# Patient Record
Sex: Male | Born: 1955 | Race: White | Hispanic: No | Marital: Married | Smoking: Never smoker
Health system: Southern US, Community
[De-identification: ages and names within clinical notes are randomized; demographics above are authoritative.]

## PROBLEM LIST (undated history)

## (undated) DIAGNOSIS — Z9889 Other specified postprocedural states: Secondary | ICD-10-CM

## (undated) DIAGNOSIS — F32A Depression, unspecified: Secondary | ICD-10-CM

## (undated) DIAGNOSIS — E7800 Pure hypercholesterolemia, unspecified: Secondary | ICD-10-CM

## (undated) DIAGNOSIS — T7840XA Allergy, unspecified, initial encounter: Secondary | ICD-10-CM

## (undated) DIAGNOSIS — M549 Dorsalgia, unspecified: Secondary | ICD-10-CM

## (undated) DIAGNOSIS — Z87442 Personal history of urinary calculi: Secondary | ICD-10-CM

## (undated) DIAGNOSIS — F329 Major depressive disorder, single episode, unspecified: Secondary | ICD-10-CM

## (undated) DIAGNOSIS — R112 Nausea with vomiting, unspecified: Secondary | ICD-10-CM

## (undated) DIAGNOSIS — Z8739 Personal history of other diseases of the musculoskeletal system and connective tissue: Secondary | ICD-10-CM

## (undated) DIAGNOSIS — E119 Type 2 diabetes mellitus without complications: Secondary | ICD-10-CM

## (undated) DIAGNOSIS — G8929 Other chronic pain: Secondary | ICD-10-CM

## (undated) DIAGNOSIS — I10 Essential (primary) hypertension: Secondary | ICD-10-CM

## (undated) DIAGNOSIS — M1990 Unspecified osteoarthritis, unspecified site: Secondary | ICD-10-CM

## (undated) HISTORY — DX: Allergy, unspecified, initial encounter: T78.40XA

## (undated) HISTORY — PX: JOINT REPLACEMENT: SHX530

## (undated) HISTORY — PX: CARPAL TUNNEL RELEASE: SHX101

## (undated) HISTORY — PX: SPINE SURGERY: SHX786

## (undated) HISTORY — PX: KNEE SURGERY: SHX244

## (undated) HISTORY — PX: OTHER SURGICAL HISTORY: SHX169

---

## 1999-08-30 ENCOUNTER — Encounter: Payer: Self-pay | Admitting: Neurosurgery

## 1999-08-30 ENCOUNTER — Encounter: Admission: RE | Admit: 1999-08-30 | Discharge: 1999-08-30 | Payer: Self-pay | Admitting: Neurosurgery

## 1999-09-11 ENCOUNTER — Encounter: Payer: Self-pay | Admitting: Neurosurgery

## 1999-09-13 ENCOUNTER — Encounter: Payer: Self-pay | Admitting: Neurosurgery

## 1999-09-13 ENCOUNTER — Ambulatory Visit (HOSPITAL_COMMUNITY): Admission: RE | Admit: 1999-09-13 | Discharge: 1999-09-14 | Payer: Self-pay | Admitting: Neurosurgery

## 2000-09-23 ENCOUNTER — Emergency Department (HOSPITAL_COMMUNITY): Admission: EM | Admit: 2000-09-23 | Discharge: 2000-09-23 | Payer: Self-pay | Admitting: Emergency Medicine

## 2001-06-30 ENCOUNTER — Encounter: Payer: Self-pay | Admitting: Emergency Medicine

## 2001-06-30 ENCOUNTER — Emergency Department (HOSPITAL_COMMUNITY): Admission: EM | Admit: 2001-06-30 | Discharge: 2001-06-30 | Payer: Self-pay | Admitting: Emergency Medicine

## 2002-02-19 ENCOUNTER — Ambulatory Visit (HOSPITAL_COMMUNITY): Admission: RE | Admit: 2002-02-19 | Discharge: 2002-02-19 | Payer: Self-pay | Admitting: Pulmonary Disease

## 2005-08-22 ENCOUNTER — Ambulatory Visit: Admission: RE | Admit: 2005-08-22 | Discharge: 2005-08-22 | Payer: Self-pay | Admitting: Neurosurgery

## 2005-08-22 ENCOUNTER — Ambulatory Visit: Payer: Self-pay | Admitting: Cardiology

## 2005-08-22 ENCOUNTER — Encounter: Payer: Self-pay | Admitting: Cardiology

## 2005-08-23 ENCOUNTER — Ambulatory Visit: Payer: Self-pay

## 2005-09-05 ENCOUNTER — Inpatient Hospital Stay (HOSPITAL_COMMUNITY): Admission: RE | Admit: 2005-09-05 | Discharge: 2005-09-09 | Payer: Self-pay | Admitting: Neurosurgery

## 2010-03-07 ENCOUNTER — Ambulatory Visit: Payer: Self-pay | Admitting: Internal Medicine

## 2010-03-07 ENCOUNTER — Ambulatory Visit (HOSPITAL_COMMUNITY)
Admission: RE | Admit: 2010-03-07 | Discharge: 2010-03-07 | Payer: Self-pay | Source: Home / Self Care | Attending: Internal Medicine | Admitting: Internal Medicine

## 2010-07-27 NOTE — Discharge Summary (Signed)
NAME:  Ethan Oliver, Ethan Oliver NO.:  [id number]   MEDICAL RECORD NO.:  [id number]          PATIENT TYPE:  INP   LOCATION:  3007                         FACILITY:  [facility]   PHYSICIAN:  Danae Orleans. Venetia Maxon, M.D.  DATE OF BIRTH:  [birth year]   DATE OF ADMISSION:  09/05/2005  DATE OF DISCHARGE:  09/09/2005                                 DISCHARGE SUMMARY   REASON FOR ADMISSION:  Lumbar scoliosis, lumbar spondylosis,  spondylolisthesis, degenerative disk disease, herniated disk, and lumbar  radiculopathy, L3-4, L4-5, and L5-S1 levels.   FINAL DIAGNOSES:  Lumbar scoliosis, lumbar spondylosis, spondylolisthesis,  degenerative disk disease, herniated disk, and lumbar radiculopathy, L3-4,  L4-5, and L5-S1 levels.   ADDITIONAL DIAGNOSES:  1.  Hypertension.  2.  Obesity.   HISTORY OF ILLNESS AND HOSPITAL COURSE:  Ethan Oliver is a 55 year old man  with significant lumbar spondylosis, spondylolisthesis and scoliosis, and  severe lumbar radiculopathy.  He was admitted on the same-day-as-procedure  basis on September 05, 2005, and underwent laminectomy and decompression with  interbody fusion and pedicle screw fixation, L3-S1 levels.  Postoperatively,  he was doing well.  Had some incisional pain, but had full strength in both  lower extremities.  He was gradually mobilized starting on September 06, 2005,  with physical therapy.  Home health services were arranged, including a  hospital bed, a rolling walker, and home physical therapy.  He was gradually  mobilized, taking oral pain medication, and doing well as of September 08, 2005,  and as of September 09, 2005, was doing well.  His dressing was removed.  He was  discharged home in stable and satisfactory condition having tolerated his  operation and hospitalization well, with instructions to follow up in my  office in 1 month and prescription of Vicodin 10/500 mg one every 4 hours as  needed for pain, dispense #100, and Valium 5 mg 1 p.o. q.6 h. as needed  for  muscular spasm, dispense #50.      Danae Orleans. Venetia Maxon, M.D.  Electronically Signed     JDS/MEDQ  D:  09/09/2005  T:  09/09/2005  Job:  [id number]

## 2010-07-27 NOTE — Op Note (Signed)
NAME:  Ethan Oliver, Ethan Oliver NO.:  [id number]   MEDICAL RECORD NO.:  [id number]          PATIENT TYPE:  INP   LOCATION:  3007                         FACILITY:  [facility]   PHYSICIAN:  Danae Orleans. Venetia Maxon, M.D.  DATE OF BIRTH:  1955-07-26   DATE OF PROCEDURE:  09/05/2005  DATE OF DISCHARGE:                                 OPERATIVE REPORT   PREOPERATIVE DIAGNOSIS:  Lumbar scoliosis, lumbar spondylosis, and  spondylolisthesis with degenerative disk disease and radiculopathy, L3-4, L4-  5, L5-S1 levels.   POSTOPERATIVE DIAGNOSIS:  Lumbar scoliosis, lumbar spondylosis, and  spondylolisthesis with degenerative disk disease and radiculopathy, L3-4, L4-  5, L5-S1 levels.   PROCEDURES:  1.  Redo laminectomy, L5-S1 bilaterally.  2.  Lumbar laminectomy of L3-4 and L4-5 levels with posterior lumbar      interbody fusion.  3.  Posterior lumbar interbody fusion with 8 mm peak interbody cages and      Osteocel at L3-4 and 10 mm interbody cages and Osteocel at L4-5, with      pedicle screw fixation, L3-S1 bilaterally with posterolateral      arthrodesis using morcellized bone autograft end-osseous.   SURGEON:  Danae Orleans. Venetia Maxon, M.D.   ASSISTANT:  Clydene Fake, M.D.   ANESTHESIA:  General endotracheal anesthesia.   ESTIMATED BLOOD LOSS:  900 cc with 500 cc of CellSaver blood returned to the  patient.   COMPLICATIONS:  None.   DISPOSITION:  To recovery.   INDICATIONS:  Ethan Oliver is a 55 year old man who has previously undergone  L5-S1 microdiskectomy on the left, who has developed severe low back and  bilateral lower extremity pain, right greater than left, with significant  spondylosis, spondylolisthesis, and scoliosis, L3-4 and L4-5 levels, in  particular, with marked degenerative disk disease and significant  biforaminal stenosis.  The most affected levels were L3-4, L4-5, and L5-S1  levels.  It was elected to take Mr. Jou to surgery for decompression and  fusion at the  affected levels.   PROCEDURE:  Mr. Gatling is brought to the operating room.  Following  satisfactory and uncomplicated induction of general endotracheal anesthesia,  placement of intravenous lines, and Foley catheter, the patient was placed  in the prone position on the operating room table.  His low back was then  prepped and draped in the usual sterile fashion.  A plain incision was  infiltrated with 0.25% Marcaine and 0.5% lidocaine with 200,000 epinephrine.  The incision was made in the midline and carried through adipose tissue to  the lumbodorsal fascia and was incised bilaterally.  Subperiosteal  dissection was performed, exposing the L3-5 and sacral transverse processes  and sacral ala.  Subsequently, intraoperative x-ray confirmed correct  orientation of the L3 with marker probes to the L3, L4, L5 transverse  processes and the sacral ala.  Subsequently, a total laminectomy of L3, L4,  and L5 was performed.  The redo laminectomy was performed on the left of L5-  S1.  The thecal sac was decompressed, and spondylotic, degenerative material  was removed, resulting in significant decompression of the thecal  sac and  all affected nerve roots.  There was a large spondylytic spur with  retrolisthesis at the L3-4 level with significant right foraminal  compression of the L3 and L4 nerve roots.  There were calcified disk  herniations at L3-4 and L4-5 levels, and a central calcified disk herniation  at L5-S1.  Under loupe magnification, diskectomy was performed at L3-4  bilaterally and L4-5 bilaterally.  After a radical removal of disk material  and use of trial sizers, peak interbody cages were placed at 8 mm at L3-4  and bilaterally into L4-5 10 mm cages.  These were packed with Osteocel, and  the cages were inserted in the interspace and countersunk appropriately.  Additional Osteocel was placed at each of these levels and tamped into  position.  Initially, the plan was to place a TLIF  cage at the L5-S1 level,  but this is highly degenerated, and it was not possible to enter the disk  space.  This was attempted on the right side, but because of significant  collapse of the interspace, it was felt the nerve roots should be  decompressed, but the fusion cage would not be placed.  Subsequently, sacral  alae and L3, L4, L5 transverse processes and lateral facet joint plexuses  were decorticated bilaterally.  Subsequently, pedicle screw fixation was  placed 6.5 x 45 mm sacral screws bilaterally and then 6.5 x 50 mm screws at  L4-5 bilaterally and 6.5 x 55 mm screws at L3 bilaterally.  All screws had  excellent purchase.  Their positioning was confirmed on AP and lateral  fluoroscopy.  Subsequently, a 90 mm rod was placed on the right and a 100 mm  rod was placed on the left, and these were locked down in situ with the  locking caps.  Prior to doing so, the remaining morcellized bone autograft,  which was ground up in the disposable bone mill and mixed with Osteocel and  also the bone dust which was aspirated in a Lukens trap at the time of bone  removal with the high speed drill, these were all mixed together and then  placed in the posterolateral region, L3-S1 levels.  Subsequently, prior to  placing the bone graft and the rods, the wound was irrigated with bacitracin  and saline.  Soft tissues were inspected and found to be in good repair.  The self-retaining retractor was then removed.  The lumbodorsal fascia was  closed with 1-0 Vicryl sutures.  The subcutaneous tissues were  reapproximated with 2-0 Vicryl interrupted, inverted sutures, and the skin  edges were reapproximated with inverted 0 Vicryl subcuticular stitch.  The  wound was dressed with Benzoin and Steri-Strips, Telfa gauze and tape.  Patient was extubated in the operating room, taken to the recovery room in  stable, satisfactory condition, having tolerated the operation well.  Counts were correct at the end of  the case.      Danae Orleans. Venetia Maxon, M.D.  Electronically Signed     JDS/MEDQ  D:  09/05/2005  T:  09/05/2005  Job:  [id number]

## 2010-07-27 NOTE — Op Note (Signed)
[facility]  Patient:    Ethan Oliver, Ethan Oliver                        MRN: [id number] Proc. Date: 09/13/99 Adm. Date:  [id number] Disc. Date: [id number] Attending:  Josie Saunders                           Operative Report  PREOPERATIVE DIAGNOSIS:  Herniated lumbar disk L5-S1 left with left S1 radiculopathy, degenerative disk disease and spondylosis.  POSTOPERATIVE DIAGNOSIS:  Herniated lumbar disk L5-S1 left with left S1 radiculopathy, degenerative disk disease and spondylosis.  PROCEDURE:  Left hemisemilaminectomy L5-S1 with microdiskectomy and microdissection.  SURGEON:  Danae Orleans. Venetia Maxon, M.D.  ASSISTANT:  Cristi Loron, M.D.  ANESTHESIA:  General endotracheal anesthesia.  ESTIMATED BLOOD LOSS:  Less than 50 cc.  COMPLICATIONS:  None.  DISPOSITION:  Recovery.  INDICATIONS:  Ethan Oliver is a 55 year old man with left S1 radiculopathy and herniated disk at L2-3, L3-4, L4-5 and L5-S1 levels.  He has a central to left-sided disk herniation noted at the L5-S1 level, which appears to be compression the S1 nerve root and has S1 radiculopathy on examination.  It was elected to take him to surgery for decompression.  PROCEDURE:  Ethan Oliver was brought to the operating room.  Following the satisfactory and noncomplicated induction of general endotracheal anesthesia and placement of intravenous lines, he was placed in the prone position on the operating table.  Low back was shaved, prepped and draped in the usual sterile fashion.  Area of planned incision was infiltrated with 0.25% Marcaine and 0.5% lidocaine, 1:200,000 epinephrine.  Incision was made in the midline at the L5-S1 interspace, carried through approximately 2 inches of adipose tissue to the lumbodorsal fascia, which was incised along the left side with electrocauthery.  A subperiosteal dissection was performed exposing te L5-S1 interspace.  Intraoperative x-ray confirmed correct  orientation.  Using Midas Rex drill and the AM8 bur, a hemisemilaminectomy of L5 was performed.  This was completed with Kerrison rongeur.  Foraminotomy was also performed overlying the S1 nerve root.  The ligamentum flavum was then elevated and removed in a piecemeal fashion decompressing the common dural tube and S1 nerve root.  The lateral recess was also decompressed.  The microscope was brought into the field and further decompression was then performed.  There was a large central to left-sided disk herniation, which was calcified.  The disk space was opened with osteotome, mallet after placing a DErrico nerve root retractor and retracting the S1 nerve root and common dural tube medially.  Subsequently, the disk space was then cleared of both soft and calcified disk material and osteophytic ridges were taken down with osteophyte-removing tool.  The disk space, which was quite narrowed, was then evacuated of residual disk material both laterally in the lateral recess and foraminal region and also more medially and the canal was felt to be well-decompressed, as was the common dural tube and S1 nerve root.  Hemostasis was obtained with Gelfoam soaked in thrombin.  The wound was then copiously irrigated with bacitracin and saline.  The self-retaining retractor was removed, microscope was taken out of the field, lumbodorsal fascia was closed with interrupted 0 Vicryl sutures.  Subcutaneous fat reapproximated with 2-0 Vicryl interrupted, inverted sutures.  Skin edges were reapproximated with interrupted 3-0 Vicryl subcuticular stitch.  Wound was dressed with benzoin, Steri-Strips, Telfa gauze and  tape.  Patient was extubated in the operating room and taken to recovery room in stable satisfactory condition, having tolerated his operation well.  Counts were correct at the end of the case. DD:  09/13/99 TD:  09/13/99 Job: [id number]

## 2010-07-27 NOTE — Consult Note (Signed)
NAME:  Ethan Oliver, Ethan Oliver NO.:  [id number]   MEDICAL RECORD NO.:  [id number]          PATIENT TYPE:  INP   LOCATION:  NA                           FACILITY:  [facility]   PHYSICIAN:  Olga Millers, M.D. LHCDATE OF BIRTH:  [birth year]   DATE OF CONSULTATION:  08/22/2005  DATE OF DISCHARGE:                                   CONSULTATION   REFERRING PHYSICIAN:  Danae Orleans. Venetia Maxon, M.D.   REASON FOR CONSULTATION:  [name] is a 55 year old male with no known  cardiac history, who presents today for elective fusion of the lumbar spine,  but was found to have an abnormal resting electrocardiogram suggestive of  possible prior inferior myocardial infarction.   The patient has cardiac risk factors notable for hypertension, significant  family history of premature coronary artery disease and age.  However, he  presents with no remote history of exertional chest discomfort.  He is able  to climb one flight of stairs without any associated symptoms.  He reports  having had a remote routine treadmill for routine evaluation, not for chest  pain and which was reportedly negative.   Resting electrocardiogram today revealed normal sinus rhythm at 74 beats per  minute with normal axis and Q leads in leads III and aVF with early  repolarization and otherwise nonspecific changes.   ALLERGIES:  CODEINE.   HOME MEDICATIONS:  1.  Lisinopril 20/25.  2.  Voltaren 75 mg every day.  3.  Aleve one tablet daily.   PAST MEDICAL HISTORY:  1.  Hypertension.  2.  Status post L5-S1 microdiskectomy 2001.  3.  Right knee arthroscopic surgery approximately 2004.  4.  Arthritis.  5.  Obesity.  6.  History of nephrolithiasis.   SOCIAL HISTORY:  The patient lives in __________ with his wife.  He has no  children.  He owns a [employer].  He has never smoked tobacco and  drinks alcohol only on rare occasions.   FAMILY HISTORY:  Mother died at age 1, secondary to myocardial infarction  (her third) with her first in her early 35s.  Father deceased at age 73,  noncardiac cause but with history of MIs and bypass surgery.  The patient  has two sisters, 64 and 42, with the younger one having suffered an MI  followed by bypass surgery at the age of 14.  The oldest sister also has had  an MI and bypass surgery.   REVIEW OF SYSTEMS:  As noted per HPI.  Denies orthopnea, paroxysmal  nocturnal dyspnea or exertional dyspnea.  Has some mild edema which he  attributes to his arthritis.  He has chronic lower back pain as well as  discomfort of the knees and ankles when he walks.  Denies any symptoms of  reflux disease.  The remaining systems are negative.   PHYSICAL EXAMINATION:  VITAL SIGNS:  Blood pressure 142/89, pulse 70 and  regular.  Respirations 18.  Temperature 97.3, saturations 97% on room air.  Weight 297.  GENERAL:  A 55 year old male, obese, in no apparent distress.  HEENT:  Normocephalic, atraumatic.  NECK:  Preserved carotid pulses without bruits.  No JVD.  LUNGS:  Clear to auscultation in all fields.  HEART:  Regular rate and rhythm (S1, S2).  No murmurs, rubs or gallops.  ABDOMEN:  Soft, nontender with intact bowel sounds.  EXTREMITIES:  Preserved bilateral femoral and dorsalis pulses with no  significant pedal edema.  NEUROLOGIC:  No focal deficits.   LABORATORY DATA:  Chest x-ray on August 19, 2005, mild peribronchial  thickening.  WBC 5.6, hemoglobin 15.2, hematocrit 44, platelets 192,000.  Sodium 139, potassium 4.6, BUN 17, creatinine 0.9, glucose 84.   IMPRESSION:  1.  Abnormal resting electrocardiogram.      1.  Question prior occult inferior myocardial infarction.  2.  Multiple cardiac risk factors.      1.  Hypertension.      2.  Strong family history of premature coronary artery disease..  3.  Significant lumbar disease.   PLAN:  The patient presents with no known cardiac history and no exertional  symptoms of angina pectoris or dyspnea.  His resting  electrocardiogram does  not appear diagnostic.  We will, however, proceed with a 2-D echocardiogram  for assessment of left ventricular function and exclusion of wall motion  abnormality.  If this is normal and with no evidence of prior myocardial  infarction, then the patient can proceed with surgery without any further  cardiac evaluation.  He would be considered at moderate risks from a  cardiovascular standpoint.  If, however, echocardiography suggests a wall  motion abnormality, then the patient will need a further ischemic evaluation  prior to surgery (i.e., stress perfusion study) versus cardiac  catheterization.      Gene Serpe, P.A. [facility]    ______________________________  Olga Millers, M.D. [facility]    GS/MEDQ  D:  08/22/2005  T:  08/22/2005  Job:  [id number]
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
## 2012-07-23 ENCOUNTER — Encounter (HOSPITAL_COMMUNITY): Payer: Self-pay | Admitting: Emergency Medicine

## 2012-07-23 ENCOUNTER — Emergency Department (HOSPITAL_COMMUNITY): Payer: Medicare Other

## 2012-07-23 ENCOUNTER — Emergency Department (HOSPITAL_COMMUNITY)
Admission: EM | Admit: 2012-07-23 | Discharge: 2012-07-23 | Disposition: A | Discharge status: Home / Self Care | Payer: Medicare Other | Attending: Emergency Medicine | Admitting: Emergency Medicine

## 2012-07-23 DIAGNOSIS — J45901 Unspecified asthma with (acute) exacerbation: Secondary | ICD-10-CM | POA: Insufficient documentation

## 2012-07-23 DIAGNOSIS — S93402A Sprain of unspecified ligament of left ankle, initial encounter: Secondary | ICD-10-CM

## 2012-07-23 DIAGNOSIS — S93401A Sprain of unspecified ligament of right ankle, initial encounter: Secondary | ICD-10-CM

## 2012-07-23 DIAGNOSIS — W1789XA Other fall from one level to another, initial encounter: Secondary | ICD-10-CM | POA: Insufficient documentation

## 2012-07-23 DIAGNOSIS — Y929 Unspecified place or not applicable: Secondary | ICD-10-CM | POA: Insufficient documentation

## 2012-07-23 DIAGNOSIS — Y939 Activity, unspecified: Secondary | ICD-10-CM | POA: Insufficient documentation

## 2012-07-23 DIAGNOSIS — M1990 Unspecified osteoarthritis, unspecified site: Secondary | ICD-10-CM

## 2012-07-23 DIAGNOSIS — I10 Essential (primary) hypertension: Secondary | ICD-10-CM | POA: Insufficient documentation

## 2012-07-23 DIAGNOSIS — M19079 Primary osteoarthritis, unspecified ankle and foot: Secondary | ICD-10-CM | POA: Insufficient documentation

## 2012-07-23 DIAGNOSIS — Z981 Arthrodesis status: Secondary | ICD-10-CM | POA: Insufficient documentation

## 2012-07-23 DIAGNOSIS — IMO0002 Reserved for concepts with insufficient information to code with codable children: Secondary | ICD-10-CM | POA: Insufficient documentation

## 2012-07-23 HISTORY — DX: Essential (primary) hypertension: I10

## 2012-07-23 MED ORDER — MELOXICAM 7.5 MG PO TABS: ORAL_TABLET | ORAL | Status: DC

## 2012-07-23 MED ORDER — ONDANSETRON HCL 4 MG PO TABS
4.0000 mg | ORAL_TABLET | Freq: Once | ORAL | Status: AC
  Administered 2012-07-23: 4 mg via ORAL
  Filled 2012-07-23: qty 1

## 2012-07-23 MED ORDER — OXYCODONE-ACETAMINOPHEN 5-325 MG PO TABS: ORAL_TABLET | ORAL | Status: DC

## 2012-07-23 MED ORDER — OXYCODONE-ACETAMINOPHEN 5-500 MG PO CAPS: 1.0000 | ORAL_CAPSULE | ORAL | Status: DC | PRN

## 2012-07-23 MED ORDER — KETOROLAC TROMETHAMINE 10 MG PO TABS
10.0000 mg | ORAL_TABLET | Freq: Once | ORAL | Status: AC
  Administered 2012-07-23: 10 mg via ORAL
  Filled 2012-07-23: qty 1

## 2012-07-23 MED ORDER — HYDROCODONE-ACETAMINOPHEN 5-325 MG PO TABS: ORAL_TABLET | ORAL | Status: DC

## 2012-07-23 MED ORDER — HYDROCODONE-ACETAMINOPHEN 5-325 MG PO TABS
2.0000 | ORAL_TABLET | Freq: Once | ORAL | Status: AC
  Administered 2012-07-23: 2 via ORAL

## 2012-07-23 NOTE — ED Provider Notes (Signed)
History     CSN: [id number]  Arrival date & time 07/23/12  1154   First MD Initiated Contact with Patient 07/23/12 1234      Chief Complaint  Patient presents with  . Ankle Pain    (Consider location/radiation/quality/duration/timing/severity/associated sxs/prior treatment) Patient is a 57 y.o. male presenting with ankle pain. The history is provided by the patient.  Ankle Pain Location:  Ankle Injury: yes   Mechanism of injury: fall   Fall:    Fall occurred: Pt fell 5 feet off of a loading dock doock.   Height of fall:  5 feet   Impact surface:  Concrete   Point of impact:  Feet   Entrapped after fall: no   Ankle location:  L ankle and R ankle (Pain worse in the right ankle.) Pain details:    Quality:  Aching and throbbing   Severity:  Moderate   Onset quality:  Sudden   Timing:  Constant   Progression:  Worsening Chronicity:  New Dislocation: no   Foreign body present:  No foreign bodies Prior injury to area:  No Relieved by:  Nothing Worsened by:  Bearing weight Associated symptoms: decreased ROM   Associated symptoms: no back pain, no neck pain and no numbness     Past Medical History  Diagnosis Date  . Asthma   . Hypertension     Past Surgical History  Procedure Laterality Date  . Disk fusion    . Knee surgery Right     Surgery on this knee twice.     History reviewed. No pertinent family history.  History  Substance Use Topics  . Smoking status: Never Smoker   . Smokeless tobacco: Current User    Types: Chew  . Alcohol Use: No      Review of Systems  Constitutional: Negative for activity change.       All ROS Neg except as noted in HPI  HENT: Negative for nosebleeds and neck pain.   Eyes: Negative for photophobia and discharge.  Respiratory: Positive for wheezing. Negative for cough and shortness of breath.   Cardiovascular: Negative for chest pain and palpitations.  Gastrointestinal: Negative for abdominal pain and blood in stool.   Genitourinary: Negative for dysuria, frequency and hematuria.  Musculoskeletal: Positive for arthralgias. Negative for back pain.  Skin: Negative.   Neurological: Negative for dizziness, seizures and speech difficulty.  Psychiatric/Behavioral: Negative for hallucinations and confusion.    Allergies  Codeine  Home Medications  No current outpatient prescriptions on file.  BP 138/90  Pulse 67  Temp(Src) 98.8 F (37.1 C)  Resp 18  Ht 5\' 11"  (1.803 m)  Wt 280 lb (127.007 kg)  BMI 39.07 kg/m2  SpO2 99%  Physical Exam  Nursing note and vitals reviewed. Constitutional: He is oriented to person, place, and time. He appears well-developed and well-nourished.  Non-toxic appearance.  HENT:  Head: Normocephalic.  Right Ear: Tympanic membrane and external ear normal.  Left Ear: Tympanic membrane and external ear normal.  Eyes: EOM and lids are normal. Pupils are equal, round, and reactive to light.  Neck: Normal range of motion. Neck supple. Carotid bruit is not present.  Cardiovascular: Normal rate, regular rhythm, normal heart sounds, intact distal pulses and normal pulses.   Pulmonary/Chest: Breath sounds normal. No respiratory distress.  Abdominal: Soft. Bowel sounds are normal. There is no tenderness. There is no guarding.  Musculoskeletal: Normal range of motion.  There is good range of motion of the right and left  hip. There is fair range of motion of the knees, with crepitus present. There are degenerative changes of the knees and ankles of right and left lower extremity. The right foot and ankle are warm to touch. There is pain with attempted range of motion of the right and left ankles. The capillary refill is less than 3 seconds bilaterally.  Lymphadenopathy:       Head (right side): No submandibular adenopathy present.       Head (left side): No submandibular adenopathy present.    He has no cervical adenopathy.  Neurological: He is alert and oriented to person, place, and  time. He has normal strength. No cranial nerve deficit or sensory deficit.  Skin: Skin is warm and dry.  Psychiatric: He has a normal mood and affect. His speech is normal.    ED Course  Procedures (including critical care time)  Labs Reviewed - No data to display No results found.   No diagnosis found.    MDM  I have reviewed nursing notes, vital signs, and all appropriate lab and imaging results for this patient. Patient states he stepped off of a loading dock and fell approximately 5 feet landing on both feet. The patient has a history of advanced arthritis involving the lower extremities. He denies any other injury.  The x-rays of the right and left feet and ankles are negative for fracture or dislocation.  The patient is fitted with an ankle stirrup splints bilaterally. Crutches were offered, however the patient states he has crutches and a walker at home. Prescription for Mobic 7.5 mg twice a day #12 tablets and Tylox one every 4 hours as needed for pain #20 given to the patient. Patient is to see his primary orthopedist for additional evaluation.       Kathie Dike, PA-C 07/23/12 1357

## 2012-07-23 NOTE — ED Notes (Signed)
Ice packs to ankles

## 2012-07-23 NOTE — ED Notes (Signed)
Fell off loading dock yesterday, Bil ankle  Swelling and foot pain,

## 2012-07-23 NOTE — ED Notes (Signed)
Pt states he fell out of a loading dock door, about 5 feet, to the ground, and landed on both feet. Pt complaining of pain and swelling in both ankles, with pain worse in the right ankle.

## 2012-07-25 NOTE — ED Provider Notes (Signed)
Medical screening examination/treatment/procedure(s) were performed by non-physician practitioner and as supervising physician I was immediately available for consultation/collaboration.   Wylodean Shimmel M Loyal Holzheimer, DO 07/25/12 1240 

## 2012-12-28 DIAGNOSIS — M47817 Spondylosis without myelopathy or radiculopathy, lumbosacral region: Secondary | ICD-10-CM | POA: Insufficient documentation

## 2013-03-08 ENCOUNTER — Other Ambulatory Visit: Payer: Self-pay | Admitting: *Deleted

## 2013-10-08 ENCOUNTER — Other Ambulatory Visit (HOSPITAL_COMMUNITY): Payer: Self-pay | Admitting: Podiatry

## 2013-10-08 DIAGNOSIS — M76829 Posterior tibial tendinitis, unspecified leg: Secondary | ICD-10-CM

## 2013-10-15 ENCOUNTER — Ambulatory Visit (HOSPITAL_COMMUNITY)
Admission: RE | Admit: 2013-10-15 | Discharge: 2013-10-15 | Disposition: A | Discharge status: Home / Self Care | Payer: Medicare Other | Source: Ambulatory Visit | Attending: Podiatry | Admitting: Podiatry

## 2013-10-15 DIAGNOSIS — S93499A Sprain of other ligament of unspecified ankle, initial encounter: Secondary | ICD-10-CM | POA: Diagnosis not present

## 2013-10-15 DIAGNOSIS — M25579 Pain in unspecified ankle and joints of unspecified foot: Secondary | ICD-10-CM | POA: Diagnosis present

## 2013-10-15 DIAGNOSIS — M24073 Loose body in unspecified ankle: Secondary | ICD-10-CM | POA: Diagnosis not present

## 2013-10-15 DIAGNOSIS — Y929 Unspecified place or not applicable: Secondary | ICD-10-CM | POA: Insufficient documentation

## 2013-10-15 DIAGNOSIS — X500XXA Overexertion from strenuous movement or load, initial encounter: Secondary | ICD-10-CM | POA: Insufficient documentation

## 2013-10-15 DIAGNOSIS — M2408 Loose body, other site: Secondary | ICD-10-CM | POA: Insufficient documentation

## 2013-10-15 DIAGNOSIS — M76829 Posterior tibial tendinitis, unspecified leg: Secondary | ICD-10-CM

## 2013-10-15 DIAGNOSIS — M19079 Primary osteoarthritis, unspecified ankle and foot: Secondary | ICD-10-CM | POA: Diagnosis not present

## 2013-10-15 DIAGNOSIS — S96819A Strain of other specified muscles and tendons at ankle and foot level, unspecified foot, initial encounter: Principal | ICD-10-CM

## 2013-12-02 ENCOUNTER — Encounter (HOSPITAL_COMMUNITY): Payer: Self-pay | Admitting: Pharmacy Technician

## 2013-12-08 ENCOUNTER — Other Ambulatory Visit: Payer: Self-pay | Admitting: Podiatry

## 2013-12-22 ENCOUNTER — Ambulatory Visit (HOSPITAL_COMMUNITY)
Admission: RE | Admit: 2013-12-22 | Discharge: 2013-12-22 | Disposition: A | Discharge status: Home / Self Care | Payer: Medicare Other | Source: Ambulatory Visit | Attending: Podiatry | Admitting: Podiatry

## 2013-12-22 ENCOUNTER — Encounter (HOSPITAL_COMMUNITY)
Admission: RE | Admit: 2013-12-22 | Discharge: 2013-12-22 | Disposition: A | Discharge status: Home / Self Care | Payer: Medicare Other | Source: Ambulatory Visit | Attending: Podiatry | Admitting: Podiatry

## 2013-12-22 ENCOUNTER — Other Ambulatory Visit: Payer: Self-pay

## 2013-12-22 ENCOUNTER — Encounter (HOSPITAL_COMMUNITY): Payer: Self-pay

## 2013-12-22 DIAGNOSIS — I10 Essential (primary) hypertension: Secondary | ICD-10-CM | POA: Insufficient documentation

## 2013-12-22 DIAGNOSIS — M2142 Flat foot [pes planus] (acquired), left foot: Secondary | ICD-10-CM | POA: Diagnosis present

## 2013-12-22 HISTORY — DX: Unspecified osteoarthritis, unspecified site: M19.90

## 2013-12-22 HISTORY — DX: Pure hypercholesterolemia, unspecified: E78.00

## 2013-12-22 HISTORY — DX: Dorsalgia, unspecified: M54.9

## 2013-12-22 HISTORY — DX: Depression, unspecified: F32.A

## 2013-12-22 HISTORY — DX: Other chronic pain: G89.29

## 2013-12-22 HISTORY — DX: Personal history of other diseases of the musculoskeletal system and connective tissue: Z87.39

## 2013-12-22 HISTORY — DX: Major depressive disorder, single episode, unspecified: F32.9

## 2013-12-22 LAB — BASIC METABOLIC PANEL
Anion gap: 9 (ref 5–15)
BUN: 22 mg/dL (ref 6–23)
CHLORIDE: 107 meq/L (ref 96–112)
CO2: 28 mEq/L (ref 19–32)
CREATININE: 0.88 mg/dL (ref 0.50–1.35)
Calcium: 9.7 mg/dL (ref 8.4–10.5)
GFR calc non Af Amer: >90 mL/min (ref >90)
GLUCOSE: 117 mg/dL — AB (ref 70–99)
POTASSIUM: 5.1 meq/L (ref 3.7–5.3)
SODIUM: 144 meq/L (ref 137–147)

## 2013-12-22 LAB — HEMOGLOBIN AND HEMATOCRIT, BLOOD
HCT: 43.5 % (ref 39.0–52.0)
Hemoglobin: 14.6 g/dL (ref 13.0–17.0)

## 2013-12-22 NOTE — Pre-Procedure Instructions (Signed)
Patient given information to sign up for my chart at home. 

## 2013-12-22 NOTE — Patient Instructions (Signed)
Ethan Oliver  12/22/2013   Your procedure is scheduled on:   12/29/2013  Report to [facility] at  65  AM.  Call this number if you have problems the morning of surgery: [phone]   Remember:   Do not eat food or drink liquids after midnight.   Take these medicines the morning of surgery with A SIP OF WATER:  Celexa, lisinopril, mobic, oxycodone   Do not wear jewelry, make-up or nail polish.  Do not wear lotions, powders, or perfumes.   Do not shave 48 hours prior to surgery. Men may shave face and neck.  Do not bring valuables to the hospital.  [facility] is not responsible for any belongings or valuables.               Contacts, dentures or bridgework may not be worn into surgery.  Leave suitcase in the car. After surgery it may be brought to your room.  For patients admitted to the hospital, discharge time is determined by your treatment team.               Patients discharged the day of surgery will not be allowed to drive home.  Name and phone number of your driver: family  Special Instructions: Shower using CHG 2 nights before surgery and the night before surgery.  If you shower the day of surgery use CHG.  Use special wash - you have one bottle of CHG for all showers.  You should use approximately 1/3 of the bottle for each shower.   Please read over the following fact sheets that you were given: Pain Booklet, Coughing and Deep Breathing, Surgical Site Infection Prevention, Anesthesia Post-op Instructions and Care and Recovery After Surgery Ankle Arthrodesis Ankle arthrodesis is a surgery that fuses together the bones that make up the ankle joint. It is performed when you have severe, lasting ankle pain or an ankle deformity. This can happen if you have had a severe break (fracture) that has not healed properly or if you have arthritis, an infection in the joint, or a bone defect. LET YOUR CAREGIVER KNOW ABOUT:   Allergies you have to food or medicine.  Medicines  you take, including vitamins, herbs, eye drops, over-the-counter medicines, and creams.  Steroids that you use (by mouth or creams).  Previous problems you have had with numbing medicines.  Bleeding problems or blood clots that you have had.  Previous surgeries that you have had.  Other health problems you have had, including diabetes and kidney problems.  The possibility of you being pregnant, if this applies.  Any complications you or members of your family have had with the use of anesthetics. RISKS AND COMPLICATIONS   Infection.  Bleeding.  Failure to heal properly. BEFORE THE PROCEDURE   Stop smoking if you smoke. Stopping smoking will improve the rate of your recovery after surgery and decrease your risk of infection.  Blood tests may be performed to make sure your blood clots normally. You may have to stop taking blood thinners (anticoagulants), aspirin, or nonsteroidal anti-inflammatory drugs (NSAIDs).  Do not eat or drink anything at least 8 hours before the surgery.  Talk to your caregiver about when to resume taking your regular medications, such as medicine for high blood pressure or high cholesterol. PROCEDURE   An intravenous (IV) access tube is put into one of your veins to give you fluids and medicines.  You will receive medicines to relax you (sedatives).  You will be given either a medicine that numbs you from the waist down (spinal anesthetic) or medicine that makes you sleep (general anesthetic). There are a number of different surgical approaches to ankle arthrodesis. In general, the surgery requires 1 large incision (open procedure) or many small incisions (minimally invasive or arthroscopic technique) to access the joint. The surgeon will remove the cartilage surfaces of the ankle joint. Removing the cartilage will initiate bone-to-bone healing, just the way it would if the ankle had been broken. Generally, screws or plates are needed to hold the bones in  their proper place. AFTER THE PROCEDURE   You will wake up in a recovery room. You will have a splint or a padded plaster cast around your leg.  Your leg will be raised (elevated) to a level above your heart to prevent swelling and decrease pain.  You will receive pain medicines. Use them as instructed.  Your temperature, breathing rate, heart rate, blood pressure, and oxygen level (vital signs) will be monitored regularly.  You will usually be kept in the hospital or surgery center until you can:  Eat and drink without feeling sick to your stomach (nauseous) or throwing up (vomiting).  Urinate.  Safely use crutches or a walker. Document Released: 08/15/2009 Document Revised: 05/20/2011 Document Reviewed: 08/15/2009 [facility] Patient Information 2015 [place], [place]. This information is not intended to replace advice given to you by your health care provider. Make sure you discuss any questions you have with your health care provider. PATIENT INSTRUCTIONS POST-ANESTHESIA  IMMEDIATELY FOLLOWING SURGERY:  Do not drive or operate machinery for the first twenty four hours after surgery.  Do not make any important decisions for twenty four hours after surgery or while taking narcotic pain medications or sedatives.  If you develop intractable nausea and vomiting or a severe headache please notify your doctor immediately.  FOLLOW-UP:  Please make an appointment with your surgeon as instructed. You do not need to follow up with anesthesia unless specifically instructed to do so.  WOUND CARE INSTRUCTIONS (if applicable):  Keep a dry clean dressing on the anesthesia/puncture wound site if there is drainage.  Once the wound has quit draining you may leave it open to air.  Generally you should leave the bandage intact for twenty four hours unless there is drainage.  If the epidural site drains for more than 36-48 hours please call the anesthesia department.  QUESTIONS?:  Please feel free to call your  physician or the hospital operator if you have any questions, and they will be happy to assist you.

## 2013-12-22 NOTE — Progress Notes (Signed)
12/22/13 1141  OBSTRUCTIVE SLEEP APNEA  Have you ever been diagnosed with sleep apnea through a sleep study? No  Do you snore loudly (loud enough to be heard through closed doors)?  1  Do you often feel tired, fatigued, or sleepy during the daytime? 0  Has anyone observed you stop breathing during your sleep? 1  Do you have, or are you being treated for high blood pressure? 1  BMI more than 35 kg/m2? 1  Age over 56 years old? 1  Neck circumference greater than 40 cm/16 inches? 1  Gender: 1  Obstructive Sleep Apnea Score 7

## 2013-12-27 DIAGNOSIS — M51369 Other intervertebral disc degeneration, lumbar region without mention of lumbar back pain or lower extremity pain: Secondary | ICD-10-CM | POA: Insufficient documentation

## 2013-12-27 DIAGNOSIS — M5136 Other intervertebral disc degeneration, lumbar region: Secondary | ICD-10-CM | POA: Insufficient documentation

## 2013-12-27 DIAGNOSIS — M5416 Radiculopathy, lumbar region: Secondary | ICD-10-CM | POA: Insufficient documentation

## 2013-12-27 DIAGNOSIS — M549 Dorsalgia, unspecified: Secondary | ICD-10-CM | POA: Insufficient documentation

## 2013-12-29 ENCOUNTER — Ambulatory Visit (HOSPITAL_COMMUNITY)
Admission: RE | Admit: 2013-12-29 | Discharge: 2013-12-29 | Disposition: A | Discharge status: Home / Self Care | Payer: Medicare Other | Source: Ambulatory Visit | Attending: Podiatry | Admitting: Podiatry

## 2013-12-29 ENCOUNTER — Encounter (HOSPITAL_COMMUNITY)
Admission: RE | Disposition: A | Discharge status: Home / Self Care | Payer: Self-pay | Source: Ambulatory Visit | Attending: Podiatry

## 2013-12-29 ENCOUNTER — Encounter (HOSPITAL_COMMUNITY): Payer: Medicare Other | Admitting: Anesthesiology

## 2013-12-29 ENCOUNTER — Ambulatory Visit (HOSPITAL_COMMUNITY): Payer: Medicare Other

## 2013-12-29 ENCOUNTER — Encounter (HOSPITAL_COMMUNITY): Payer: Self-pay | Admitting: *Deleted

## 2013-12-29 ENCOUNTER — Ambulatory Visit (HOSPITAL_COMMUNITY): Payer: Medicare Other | Admitting: Anesthesiology

## 2013-12-29 DIAGNOSIS — M19072 Primary osteoarthritis, left ankle and foot: Secondary | ICD-10-CM | POA: Insufficient documentation

## 2013-12-29 DIAGNOSIS — Z9889 Other specified postprocedural states: Secondary | ICD-10-CM

## 2013-12-29 DIAGNOSIS — M2142 Flat foot [pes planus] (acquired), left foot: Secondary | ICD-10-CM | POA: Insufficient documentation

## 2013-12-29 DIAGNOSIS — M76829 Posterior tibial tendinitis, unspecified leg: Secondary | ICD-10-CM

## 2013-12-29 DIAGNOSIS — M216X2 Other acquired deformities of left foot: Secondary | ICD-10-CM | POA: Diagnosis not present

## 2013-12-29 HISTORY — PX: FOOT ARTHRODESIS: SHX1655

## 2013-12-29 HISTORY — PX: CALCANEAL OSTEOTOMY WITH ILIAC CREST BONE GRAFT AND REPAIR SUBLEXING TENDON AND ACHILLES TENDON: SHX6325

## 2013-12-29 HISTORY — PX: ACHILLES TENDON SURGERY: SHX542

## 2013-12-29 SURGERY — Surgical Case
Anesthesia: *Unknown

## 2013-12-29 SURGERY — FUSION, JOINT, FOOT
Anesthesia: General | Site: Foot | Laterality: Left

## 2013-12-29 MED ORDER — DEXAMETHASONE SODIUM PHOSPHATE 4 MG/ML IJ SOLN
4.0000 mg | Freq: Once | INTRAMUSCULAR | Status: AC
  Administered 2013-12-29: 4 mg via INTRAVENOUS

## 2013-12-29 MED ORDER — BUPIVACAINE HCL (PF) 0.5 % IJ SOLN
INTRAMUSCULAR | Status: DC | PRN
  Administered 2013-12-29: 20 mL

## 2013-12-29 MED ORDER — GLYCOPYRROLATE 0.2 MG/ML IJ SOLN
INTRAMUSCULAR | Status: DC | PRN
  Administered 2013-12-29: 0.4 mg via INTRAVENOUS

## 2013-12-29 MED ORDER — FENTANYL CITRATE 0.05 MG/ML IJ SOLN
INTRAMUSCULAR | Status: DC | PRN
  Administered 2013-12-29: 25 ug via INTRAVENOUS
  Administered 2013-12-29: 50 ug via INTRAVENOUS
  Administered 2013-12-29 (×5): 25 ug via INTRAVENOUS
  Administered 2013-12-29 (×2): 50 ug via INTRAVENOUS

## 2013-12-29 MED ORDER — PROPOFOL 10 MG/ML IV BOLUS
INTRAVENOUS | Status: DC | PRN
  Administered 2013-12-29: 170 mg via INTRAVENOUS
  Administered 2013-12-29: 10 mg via INTRAVENOUS
  Administered 2013-12-29: 20 mg via INTRAVENOUS
  Administered 2013-12-29: 50 mg via INTRAVENOUS
  Administered 2013-12-29: 20 mg via INTRAVENOUS
  Administered 2013-12-29: 30 mg via INTRAVENOUS
  Administered 2013-12-29 (×2): 20 mg via INTRAVENOUS
  Administered 2013-12-29: 30 mg via INTRAVENOUS

## 2013-12-29 MED ORDER — PROPOFOL 10 MG/ML IV EMUL
INTRAVENOUS | Status: AC
  Filled 2013-12-29: qty 20

## 2013-12-29 MED ORDER — FENTANYL CITRATE 0.05 MG/ML IJ SOLN
INTRAMUSCULAR | Status: AC
  Filled 2013-12-29: qty 2

## 2013-12-29 MED ORDER — CEFAZOLIN SODIUM-DEXTROSE 2-3 GM-% IV SOLR
2.0000 g | INTRAVENOUS | Status: AC
  Administered 2013-12-29: 2 g via INTRAVENOUS
  Filled 2013-12-29: qty 50

## 2013-12-29 MED ORDER — ROCURONIUM BROMIDE 50 MG/5ML IV SOLN
INTRAVENOUS | Status: AC
  Filled 2013-12-29: qty 1

## 2013-12-29 MED ORDER — MIDAZOLAM HCL 2 MG/2ML IJ SOLN
1.0000 mg | INTRAMUSCULAR | Status: DC | PRN
  Administered 2013-12-29 (×2): 2 mg via INTRAVENOUS
  Filled 2013-12-29: qty 2

## 2013-12-29 MED ORDER — NEOSTIGMINE METHYLSULFATE 10 MG/10ML IV SOLN
INTRAVENOUS | Status: DC | PRN
  Administered 2013-12-29 (×3): 1 mg via INTRAVENOUS

## 2013-12-29 MED ORDER — ONDANSETRON HCL 4 MG/2ML IJ SOLN: 4.0000 mg | Freq: Once | INTRAMUSCULAR | Status: DC | PRN

## 2013-12-29 MED ORDER — LACTATED RINGERS IV SOLN
INTRAVENOUS | Status: DC
  Administered 2013-12-29 (×3): via INTRAVENOUS

## 2013-12-29 MED ORDER — GLYCOPYRROLATE 0.2 MG/ML IJ SOLN
0.2000 mg | Freq: Once | INTRAMUSCULAR | Status: AC
  Administered 2013-12-29: 0.2 mg via INTRAVENOUS

## 2013-12-29 MED ORDER — ROCURONIUM BROMIDE 100 MG/10ML IV SOLN
INTRAVENOUS | Status: DC | PRN
  Administered 2013-12-29: 5 mg via INTRAVENOUS
  Administered 2013-12-29: 10 mg via INTRAVENOUS
  Administered 2013-12-29: 20 mg via INTRAVENOUS
  Administered 2013-12-29: 25 mg via INTRAVENOUS

## 2013-12-29 MED ORDER — FENTANYL CITRATE 0.05 MG/ML IJ SOLN
25.0000 ug | INTRAMUSCULAR | Status: DC | PRN
  Administered 2013-12-29 (×2): 50 ug via INTRAVENOUS

## 2013-12-29 MED ORDER — SODIUM CHLORIDE 0.9 % IJ SOLN
INTRAMUSCULAR | Status: AC
  Filled 2013-12-29: qty 10

## 2013-12-29 MED ORDER — EPHEDRINE SULFATE 50 MG/ML IJ SOLN
INTRAMUSCULAR | Status: DC | PRN
  Administered 2013-12-29 (×7): 5 mg via INTRAVENOUS

## 2013-12-29 MED ORDER — DEXAMETHASONE SODIUM PHOSPHATE 4 MG/ML IJ SOLN
INTRAMUSCULAR | Status: AC
  Filled 2013-12-29: qty 1

## 2013-12-29 MED ORDER — LIDOCAINE HCL (CARDIAC) 10 MG/ML IV SOLN
INTRAVENOUS | Status: DC | PRN
  Administered 2013-12-29: 20 mg via INTRAVENOUS

## 2013-12-29 MED ORDER — ONDANSETRON HCL 4 MG/2ML IJ SOLN
INTRAMUSCULAR | Status: AC
  Filled 2013-12-29: qty 2

## 2013-12-29 MED ORDER — SUCCINYLCHOLINE CHLORIDE 20 MG/ML IJ SOLN
INTRAMUSCULAR | Status: DC | PRN
  Administered 2013-12-29: 140 mg via INTRAVENOUS

## 2013-12-29 MED ORDER — LIDOCAINE HCL (PF) 1 % IJ SOLN
INTRAMUSCULAR | Status: AC
  Filled 2013-12-29: qty 5

## 2013-12-29 MED ORDER — BUPIVACAINE HCL (PF) 0.5 % IJ SOLN
INTRAMUSCULAR | Status: AC
  Filled 2013-12-29: qty 30

## 2013-12-29 MED ORDER — SUCCINYLCHOLINE CHLORIDE 20 MG/ML IJ SOLN
INTRAMUSCULAR | Status: AC
  Filled 2013-12-29: qty 1

## 2013-12-29 MED ORDER — SODIUM CHLORIDE 0.9 % IR SOLN
Status: DC | PRN
  Administered 2013-12-29 (×2): 1000 mL

## 2013-12-29 MED ORDER — GLYCOPYRROLATE 0.2 MG/ML IJ SOLN
INTRAMUSCULAR | Status: AC
  Filled 2013-12-29: qty 1

## 2013-12-29 MED ORDER — ONDANSETRON HCL 4 MG/2ML IJ SOLN
4.0000 mg | Freq: Once | INTRAMUSCULAR | Status: AC
  Administered 2013-12-29: 4 mg via INTRAVENOUS

## 2013-12-29 MED ORDER — MIDAZOLAM HCL 2 MG/2ML IJ SOLN
INTRAMUSCULAR | Status: AC
  Filled 2013-12-29: qty 2

## 2013-12-29 MED ORDER — EPHEDRINE SULFATE 50 MG/ML IJ SOLN
INTRAMUSCULAR | Status: AC
  Filled 2013-12-29: qty 1

## 2013-12-29 SURGICAL SUPPLY — 83 items
APL SKNCLS STERI-STRIP NONHPOA (GAUZE/BANDAGES/DRESSINGS) ×2
BAG HAMPER (MISCELLANEOUS) ×4 IMPLANT
BANDAGE CONFORM 2  STR LF (GAUZE/BANDAGES/DRESSINGS) ×3 IMPLANT
BANDAGE ELASTIC 4 VELCRO NS (GAUZE/BANDAGES/DRESSINGS) ×10 IMPLANT
BANDAGE ESMARK 4X12 BL STRL LF (DISPOSABLE) ×2 IMPLANT
BANDAGE GAUZE ELAST BULKY 4 IN (GAUZE/BANDAGES/DRESSINGS) ×3 IMPLANT
BENZOIN TINCTURE PRP APPL 2/3 (GAUZE/BANDAGES/DRESSINGS) ×4 IMPLANT
BIT DRILL 2 (BIT) ×3 IMPLANT
BIT DRILL 2.0 HCS 150 (BIT) ×1 IMPLANT
BIT DRILL 3.0 (BIT) ×2 IMPLANT
BIT DRILL 3.0MM (BIT) ×1
BIT DRILL CANN 4.9 LRG AO FIT (BIT) ×1 IMPLANT
BLADE 15 SAFETY STRL DISP (BLADE) ×2 IMPLANT
BLADE AVERAGE 25MMX9MM (BLADE) ×1
BLADE AVERAGE 25X9 (BLADE) ×3 IMPLANT
BLADE SURG 15 STRL LF DISP TIS (BLADE) ×2 IMPLANT
BLADE SURG 15 STRL SS (BLADE) ×8
BNDG CMPR 12X4 ELC STRL LF (DISPOSABLE) ×2
BNDG CONFORM 2 STRL LF (GAUZE/BANDAGES/DRESSINGS) ×4 IMPLANT
BNDG ESMARK 4X12 BLUE STRL LF (DISPOSABLE) ×4
BNDG GAUZE ELAST 4 BULKY (GAUZE/BANDAGES/DRESSINGS) ×4 IMPLANT
CHLORAPREP W/TINT 26ML (MISCELLANEOUS) ×4 IMPLANT
CLIP EASY 18MMX17MM (Staple) ×6 IMPLANT
CLIP EASY STAPLE 20X16X16 (Clip) ×3 IMPLANT
CLOSURE WOUND 1/2 X4 (GAUZE/BANDAGES/DRESSINGS) ×2
CLOTH BEACON ORANGE TIMEOUT ST (SAFETY) ×4 IMPLANT
COVER LIGHT HANDLE STERIS (MISCELLANEOUS) ×8 IMPLANT
CUFF TOURNIQUET SINGLE 18IN (TOURNIQUET CUFF) IMPLANT
CUFF TOURNIQUET SINGLE 34IN LL (TOURNIQUET CUFF) ×1 IMPLANT
CUFF TOURNIQUET SINGLE 44IN (TOURNIQUET CUFF) ×3 IMPLANT
DECANTER SPIKE VIAL GLASS SM (MISCELLANEOUS) ×4 IMPLANT
DRAPE C-ARMOR (DRAPES) ×3 IMPLANT
DRAPE OEC MINIVIEW 54X84 (DRAPES) ×1 IMPLANT
DRAPE PROXIMA HALF (DRAPES) ×3 IMPLANT
DRILL CANNULATED 4.9 (BIT) ×4
DRSG ADAPTIC 3X8 NADH LF (GAUZE/BANDAGES/DRESSINGS) ×4 IMPLANT
DURA STEPPER LG (CAST SUPPLIES) IMPLANT
DURA STEPPER MED (CAST SUPPLIES) IMPLANT
DURA STEPPER SML (CAST SUPPLIES) IMPLANT
DURA STEPPER XL (SOFTGOODS) IMPLANT
ELECT REM PT RETURN 9FT ADLT (ELECTROSURGICAL) ×4
ELECTRODE REM PT RTRN 9FT ADLT (ELECTROSURGICAL) ×2 IMPLANT
GAUZE SPONGE 4X4 12PLY STRL (GAUZE/BANDAGES/DRESSINGS) ×4 IMPLANT
GLOVE BIO SURGEON STRL SZ7.5 (GLOVE) ×4 IMPLANT
GLOVE BIO SURGEON STRL SZ8 (GLOVE) ×3 IMPLANT
GLOVE BIOGEL M 7.0 STRL (GLOVE) ×6 IMPLANT
GLOVE BIOGEL M STRL SZ7.5 (GLOVE) ×4 IMPLANT
GLOVE BIOGEL PI IND STRL 7.0 (GLOVE) ×3 IMPLANT
GLOVE BIOGEL PI INDICATOR 7.0 (GLOVE) ×6
GLOVE ECLIPSE 6.5 STRL STRAW (GLOVE) ×3 IMPLANT
GLOVE EXAM NITRILE MD LF STRL (GLOVE) ×3 IMPLANT
GOWN STRL REUS W/TWL LRG LVL3 (GOWN DISPOSABLE) ×12 IMPLANT
GUIDEWIRE THREADED 3.2MM (WIRE) ×3 IMPLANT
K-WIRE NON THREAD 1.1 (WIRE)
K-WIRE SNGL END TROCAR 9X.062 (WIRE) ×8
KIT ROOM TURNOVER AP CYSTO (KITS) ×4 IMPLANT
KWIRE NON THREAD 1.1 (WIRE) ×1 IMPLANT
KWIRE SNGL END TROCAR 9X.062 (WIRE) ×2 IMPLANT
MANIFOLD NEPTUNE II (INSTRUMENTS) ×4 IMPLANT
NDL HYPO 27GX1-1/4 (NEEDLE) ×3 IMPLANT
NEEDLE HYPO 27GX1-1/4 (NEEDLE) ×8 IMPLANT
NS IRRIG 1000ML POUR BTL (IV SOLUTION) ×4 IMPLANT
PACK BASIC LIMB (CUSTOM PROCEDURE TRAY) ×4 IMPLANT
PACK FOAM VITOSS BBTRAUMA 1.2 (Bone Implant) ×3 IMPLANT
PAD ABD 5X9 TENDERSORB (GAUZE/BANDAGES/DRESSINGS) ×6 IMPLANT
PAD ARMBOARD 7.5X6 YLW CONV (MISCELLANEOUS) ×4 IMPLANT
RASP SM TEAR CROSS CUT (RASP) ×4 IMPLANT
SCREW CANNULATED 7.0X65 (Screw) ×3 IMPLANT
SET BASIN LINEN APH (SET/KITS/TRAYS/PACK) ×4 IMPLANT
SPLINT FIBERGLASS 4X30 (CAST SUPPLIES) ×3 IMPLANT
SPONGE GAUZE 4X4 12PLY (GAUZE/BANDAGES/DRESSINGS) ×2 IMPLANT
SPONGE LAP 18X18 X RAY DECT (DISPOSABLE) ×4 IMPLANT
STAPLER VISISTAT (STAPLE) ×3 IMPLANT
STRIP CLOSURE SKIN 1/2X4 (GAUZE/BANDAGES/DRESSINGS) ×6 IMPLANT
SUT MON AB 5-0 PS2 18 (SUTURE) ×4 IMPLANT
SUT PROLENE 4 0 PS 2 18 (SUTURE) ×4 IMPLANT
SUT VIC AB 2-0 CT2 27 (SUTURE) ×4 IMPLANT
SUT VIC AB 4-0 PS2 27 (SUTURE) ×4 IMPLANT
SUT VICRYL AB 3-0 FS1 BRD 27IN (SUTURE) ×1 IMPLANT
SYRINGE CONTROL L 12CC (SYRINGE) IMPLANT
SYRINGE CONTROL LL 12CC (SYRINGE) ×2 IMPLANT
WATER STERILE IRR 1000ML POUR (IV SOLUTION) ×4 IMPLANT
WEDGE EVANS 10X22X20 (Tissue) ×3 IMPLANT

## 2013-12-29 NOTE — Transfer of Care (Signed)
Immediate Anesthesia Transfer of Care Note  Patient: Ethan Oliver  Procedure(s) Performed: Procedure(s): TRIPLE ARTHRODESIS FOOT (Left) PERCUTANEOUS TENDO-ACHILLES LENGTHENING (Left) CALCANEOCUBOID JOINT FUSION PERFORMED WITH BONE GRAFT (Left)  Patient Location: PACU  Anesthesia Type:General  Level of Consciousness: awake and patient cooperative  Airway & Oxygen Therapy: Patient Spontanous Breathing and Patient connected to face mask oxygen  Post-op Assessment: Report given to PACU RN, Post -op Vital signs reviewed and stable and Patient moving all extremities  Post vital signs: Reviewed and stable  Complications: No apparent anesthesia complications

## 2013-12-29 NOTE — Anesthesia Preprocedure Evaluation (Signed)
Anesthesia Evaluation  Patient identified by MRN, date of birth, ID band Patient awake    Reviewed: Allergy & Precautions, H&P , NPO status , Patient's Chart, lab work & pertinent test results  Airway Mallampati: II TM Distance: >3 FB     Dental  (+) Teeth Intact   Pulmonary neg pulmonary ROS,  breath sounds clear to auscultation        Cardiovascular hypertension, Pt. on medications Rhythm:Regular Rate:Normal     Neuro/Psych PSYCHIATRIC DISORDERS Depression    GI/Hepatic   Endo/Other  Morbid obesity  Renal/GU      Musculoskeletal  (+) Arthritis -,   Abdominal   Peds  Hematology   Anesthesia Other Findings   Reproductive/Obstetrics                           Anesthesia Physical Anesthesia Plan  ASA: II  Anesthesia Plan: General   Post-op Pain Management:    Induction: Intravenous, Rapid sequence and Cricoid pressure planned  Airway Management Planned: Oral ETT  Additional Equipment:   Intra-op Plan:   Post-operative Plan: Extubation in OR  Informed Consent: I have reviewed the patients History and Physical, chart, labs and discussed the procedure including the risks, benefits and alternatives for the proposed anesthesia with the patient or authorized representative who has indicated his/her understanding and acceptance.     Plan Discussed with:   Anesthesia Plan Comments:         Anesthesia Quick Evaluation

## 2013-12-29 NOTE — Anesthesia Postprocedure Evaluation (Signed)
  Anesthesia Post-op Note  Patient: Ethan Oliver  Procedure(s) Performed: Procedure(s): TRIPLE ARTHRODESIS FOOT (Left) PERCUTANEOUS TENDO-ACHILLES LENGTHENING (Left) CALCANEOCUBOID JOINT FUSION PERFORMED WITH BONE GRAFT (Left)  Patient Location: PACU  Anesthesia Type:General  Level of Consciousness: awake and patient cooperative  Airway and Oxygen Therapy: Patient Spontanous Breathing  Post-op Pain: mild  Post-op Assessment: Post-op Vital signs reviewed, Patient's Cardiovascular Status Stable, Respiratory Function Stable, Patent Airway and No signs of Nausea or vomiting    Complications: No apparent anesthesia complications

## 2013-12-29 NOTE — H&P (Signed)
HISTORY AND PHYSICAL INTERVAL NOTE:  12/29/2013  8:21 AM  Ethan Oliver  has presented today for surgery, with the diagnosis of flat acquired left foot, posterior tibial tendon dysfunction left foot, arthritis of foot-left, equinus left foot, pain left foot.  The various methods of treatment have been discussed with the patient.  No guarantees were given.  After consideration of risks, benefits and other options for treatment, the patient has consented to surgery.  I have reviewed the patients' chart and labs.    A history and physical examination was performed in my office.  The patient was reexamined.  He is know taking atorvastatin.  There have been no changes to the physical examination.  Marcheta Grammes, DPM

## 2013-12-29 NOTE — Anesthesia Procedure Notes (Addendum)
Procedure Name: Intubation Date/Time: 12/29/2013 9:06 AM Performed by: Vista Deck Pre-anesthesia Checklist: Patient identified, Patient being monitored, Timeout performed, Emergency Drugs available and Suction available Patient Re-evaluated:Patient Re-evaluated prior to inductionOxygen Delivery Method: Circle System Utilized Preoxygenation: Pre-oxygenation with 100% oxygen Intubation Type: IV induction, Rapid sequence and Cricoid Pressure applied Laryngoscope Size: Miller and 2 Grade View: Grade I Tube type: Oral Tube size: 8.0 mm Number of attempts: 1 Airway Equipment and Method: stylet and Oral airway Placement Confirmation: ETT inserted through vocal cords under direct vision,  positive ETCO2 and breath sounds checked- equal and bilateral Secured at: 23 cm Tube secured with: Tape Dental Injury: Teeth and Oropharynx as per pre-operative assessment

## 2013-12-29 NOTE — Addendum Note (Signed)
Addendum created 12/29/13 1512 by Vista Deck, CRNA   Modules edited: Charges VN

## 2013-12-29 NOTE — Op Note (Signed)
OPERATIVE NOTE  DATE OF PROCEDURE:  12/29/2013  SURGEON:   Marcheta Grammes, DPM  OR STAFF:   Circulator: Effie Berkshire, RN Radiology Technologist: Dorann Lodge; Particia Jasper, Rad Tech Relief Circulator: Glory Rosebush, RN Relief Scrub: Jeffie Pollock, CST Scrub Person: Evelene Croon, CST Circulator Assistant: Beckie Salts Page, RN; Glory Rosebush, RN RN First Assistant: Towanda Malkin, RN OR Clinical Technician: Lujean Amel, OCT   PREOPERATIVE DIAGNOSIS:   1.  Posterior tibial tendon dysfunction, left foot (ICD-10 M21.40) 2.  Acquired flatfoot deformity with osteoarthrosis, left foot (ICD-10  M19.072) 3.  Acquired equinus, left foot (ICD-10 M21.6X2) 4.  Pain, left foot (ICD-10 M79.672)  POSTOPERATIVE DIAGNOSIS: Same  PROCEDURE: 1.  Triple arthrodesis, left foot (CPT  [phone]) 2.  Percutaneous lengthening of the Achilles tendon under general anesthesia, left foot (CPT [phone])  ANESTHESIA:  General   HEMOSTASIS:   Pneumatic ankle tourniquet set at 300 mmHg  ESTIMATED BLOOD LOSS:   Minimal (<5 cc)  MATERIALS USED:  1.  10 mm Evans wedge bone graft by Stryker 2.  Stryker EasyClip x 3 3.  Stryker screw 7.0 mm screw x 1 4.  Vitoss   INJECTABLES: Marcaine 0.5% plain; 68mL  PATHOLOGY:   None  COMPLICATIONS:   None  INDICATIONS:  Painful flatfoot deformity of the left foot.  DESCRIPTION OF THE PROCEDURE:   The patient was brought to the operating room and placed on the operative table in the supine position.  A pneumatic thigh tourniquet was applied to the patient's thigh.  After induction of general anesthesia, the foot was then prepped, scrubbed, and draped in the usual sterile technique.  The foot was elevated, exsanguinated and the pneumatic thigh tourniquet inflated to 300 mmHg.    Attention was directed to the dorsal medial aspect of the left foot.  A linear longitudinal incision was made over the talonavicular joint.   Dissection was continued deep down to the level of the talonavicular joint.  The tibialis anterior tendon was retracted medially and the extensor hallucis longus tendon was retracted laterally.  The corresponding articular surfaces of the talus and navicular were resected using a curette and power rasp.  The joint surfaces were drilled with a 0.062 inch Kirschner wire to promote bony ingrowth.  Attention was directed to the lateral aspect of the left foot.  A linear longitudinal incision was made overlying the calcaneocuboid joint and subtalar joint.  The peroneal structures were reflected inferiorly and the extensor digitorum brevis tendon was reflected superiorly.  The articular surfaces of the calcaneocuboid joint were resected using a power bone saw, curette and power rasp.  The articular surfaces of the subtalar joint were resected using a curette and power rasp.  The joint surfaces were drilled with a 0.062 inch Kirschner wire to promote bony ingrowth  The 10 mm Evans graft was inserted into the calcaneocuboid joint and fixated with an EasyClip staple.  Position of the graft and staple were evaluated under fluoroscopy and noted to be in good alignment.  A stab incision was made along the posterior inferior aspect of the left heel.  The subtalar joint was positioned.  An attempt was made to insert the guidewire from the Stryker 7.0 mm screw set across subtalar joint.  Optimal visualization was not obtained with fluoroscopy.  The wire was removed and a decision was made to go from a dorsal approach.  K wire was inserted through the talus into the calcaneus.  The tourniquet was then deflated.  The tourniquet was not reinflated.    A 7.0 mm Stryker screw was inserted across the K wire.  Adequate compression was noted.  A small osseous defect was noted along the posterior aspect of the subtalar joint.    Attention was directed to the talonavicular joint.  2 Stryker staples were inserted across the  talonavicular joint.  All surgical wounds were irrigated with copious amounts of sterile irrigant.  The position of the foot was assessed and found to be significantly improved.  The equinus was evaluated.  I was unable to dorsiflex the foot to neutral position.  Attention was directed to the posterior aspect of the Achilles tendon.  3 stab incisions were made using a #11 blade.  The first incision was made 1-1/2 cm proximal to the insertion of the Achilles tendon.  The medial portion of the Achilles tendon was transected.  The second incision was made 1-1/2 cm proximal to the first incision.  The lateral portion of the Achilles tendon was transected.  The third incision was made 1-1/2 cm proximal to the second incision.  The medial portion of the Achilles tendon was transected.  Following the percutaneous tenotomy of the Achilles tendon, I was able to dorsiflex his foot to 10 beyond neutral position.  This defect of the posterior subtalar joint was packed with Vitoss.  All periosteal and capsular incisions were reapproximated using 3-0 Vicryl.  All subcutaneous structures reapproximated using 4-0 Vicryl.  All skin was reapproximated using skin staples.  A sterile compressive dressing was applied to the left foot.  A below-knee fiberglass splint was applied to the left lower extremity with the ankle joint dorsiflexed.  The patient tolerated the procedure well.  The patient was then transferred to PACU with vital signs stable and vascular status intact to all toes of the operative foot.  Following a period of postoperative monitoring, the patient will be discharged home.

## 2013-12-30 ENCOUNTER — Encounter (HOSPITAL_COMMUNITY): Payer: Self-pay | Admitting: Podiatry

## 2014-03-22 ENCOUNTER — Ambulatory Visit (HOSPITAL_COMMUNITY)
Admission: RE | Admit: 2014-03-22 | Discharge: 2014-03-22 | Disposition: A | Discharge status: Home / Self Care | Payer: Medicare Other | Source: Ambulatory Visit | Attending: Podiatry | Admitting: Podiatry

## 2014-03-22 DIAGNOSIS — M25672 Stiffness of left ankle, not elsewhere classified: Secondary | ICD-10-CM | POA: Diagnosis not present

## 2014-03-22 DIAGNOSIS — R262 Difficulty in walking, not elsewhere classified: Secondary | ICD-10-CM | POA: Insufficient documentation

## 2014-03-22 DIAGNOSIS — M6281 Muscle weakness (generalized): Secondary | ICD-10-CM | POA: Diagnosis not present

## 2014-03-22 DIAGNOSIS — R29898 Other symptoms and signs involving the musculoskeletal system: Secondary | ICD-10-CM

## 2014-03-22 DIAGNOSIS — Z981 Arthrodesis status: Secondary | ICD-10-CM

## 2014-03-22 NOTE — Patient Instructions (Signed)
  10x each twice daily, when they become easy progress up to 20x Then progress to more difficult band: Red to Green to [employer]   With towel around forefoot, keep knee straight and pull back on towel until a stretch is felt in the calf. Hold 10 seconds. Repeat __10__ times. Do ___2_ sessions per day.  Copyright  VHI. All rights reserved.   Ankle ABC: spell the ABC's with your big toe 2x daily

## 2014-03-22 NOTE — Therapy (Signed)
[place], [place], [address] Phone: [phone]   Fax:  [phone]  Physical Therapy Evaluation  Patient Details  Name: Ethan Oliver MRN: [id number] Date of Birth: 1955/11/13 Referring Provider:  Marcheta Grammes*  Encounter Date: 03/22/2014      PT End of Session - 03/22/14 1432    Visit Number 1   Number of Visits 24   Date for PT Re-Evaluation 04/21/14   Authorization Type Medicare   Authorization - Visit Number 1   Authorization - Number of Visits 10   PT Start Time 1638   PT Stop Time 1430   PT Time Calculation (min) 43 min   Activity Tolerance Patient tolerated treatment well   Behavior During Therapy [facility] for tasks assessed/performed      Past Medical History  Diagnosis Date  . Hypertension   . Hypercholesteremia   . Chronic back pain   . Depression   . Arthritis   . History of gout     Past Surgical History  Procedure Laterality Date  . Disk fusion      lumbar  . Knee surgery Right     Surgery on this knee twice.   . Carpal tunnel release Bilateral   . Foot arthrodesis Left 12/29/2013    Procedure: TRIPLE ARTHRODESIS FOOT;  Surgeon: Marcheta Grammes, DPM;  Location: [facility];  Service: Podiatry;  Laterality: Left;  . Achilles tendon surgery Left 12/29/2013    Procedure: PERCUTANEOUS TENDO-ACHILLES LENGTHENING;  Surgeon: Marcheta Grammes, DPM;  Location: [facility];  Service: Podiatry;  Laterality: Left;  . Calcaneal osteotomy with iliac crest bone graft and repair sublexing tendon and achilles tendon Left 12/29/2013    Procedure: CALCANEOCUBOID JOINT FUSION PERFORMED WITH BONE GRAFT;  Surgeon: Marcheta Grammes, DPM;  Location: [facility];  Service: Podiatry;  Laterality: Left;    There were no vitals taken for this visit.  Visit Diagnosis:  Ankle stiffness, left  S/P ankle arthrodesis  Ankle weakness  Difficulty walking      Subjective Assessment - 03/22/14  1350    Symptoms no pain just tenderness and sensitivity in Lt foot along scars.    Pertinent History patient had surgery December 29, 2013 on Lt ankle for triple arthrodesis. patient initially sprained bilateral ankles over a year ago. Patient notes he's always walked flat footed with a history of foot and ankel problems, but nothing as bad as the ankle sprain patient's foot has since the surgery been feeling better.    How long can you stand comfortably? <49minutes   How long can you walk comfortably? <5 minutes   Patient Stated Goals Patient wants to be able to walk without an assistive device and be able to fish.    Currently in Pain? Yes   Pain Score 0-No pain   Pain Location Foot   Aggravating Factors  standing and prolonged weight bearing.           [facility] PT Assessment - 03/22/14 0001    Assessment   Medical Diagnosis S/p triple arthrodesis Lt foot.    Onset Date 12/30/14   Next MD Visit B Caprice Beaver 04/05/14   Prior Therapy no    Restrictions   Weight Bearing Restrictions Yes   LLE Weight Bearing Weight bearing as tolerated   Balance Screen   Has the patient fallen in the past 6 months No   Has the patient had a decrease in activity level because of a  fear of falling?  No   Is the patient reluctant to leave their home because of a fear of falling?  No   Prior Function   Level of Independence Independent with basic ADLs   AROM   Overall AROM Comments Inversion eversion not assessed due to limited non frontal plane movement. to be reassessed in 4 weeks.    Left Hip Flexion --   Right Ankle Dorsiflexion 7   Right Ankle Plantar Flexion 38   Left Ankle Dorsiflexion 4   Left Ankle Plantar Flexion 28   Strength   Overall Strength Comments Rt 5/5 MMT   Left Knee Flexion 4/5   Left Knee Extension 5/5   Left Ankle Dorsiflexion 2-/5   Left Ankle Plantar Flexion 2-/5                  [facility] Adult PT Treatment/Exercise - 03/22/14 0001    Ankle Exercises: Stretches    Gastroc Stretch 5 reps;10 seconds   Gastroc Stretch Limitations towel   Other Stretch --   Ankle Exercises: Seated   ABC's 1 rep   Other Seated Ankle Exercises 4 way ankle theraband with red and green theraband 10x each.                 PT Education - 03/22/14 1425    Education provided Yes   Education Details HEP. see exercises performed and patient instructions.    Person(s) Educated Patient   Methods Explanation;Demonstration;Handout   Comprehension Verbalized understanding          PT Short Term Goals - 03/22/14 1445    PT SHORT TERM GOAL #1   Title Patient will be able to dorsiflex foot to 10 degrees to decreaase early heel off during gait   Time 4   Period Weeks   Status New   PT SHORT TERM GOAL #2   Title Patient will be able to plantar flex foot >40 degrees bilaterally   Time 4   Period Weeks   Status New   PT SHORT TERM GOAL #3   Title Patient will be able to Stand both feet on the ground >31minutes to cook BBQ.    Time 4   Period Weeks   Status New   PT SHORT TERM GOAL #4   Title Paqtient will be able to walk 15 minutes with CAM walker with pain <20 minutes.   Time 4   Period Weeks   Status New           PT Long Term Goals - 03/22/14 1451    PT LONG TERM GOAL #1   Title Patient will be able to dorsiflex foot to 15 degrees to decreaase early heel off during gait   Time 8   PT LONG TERM GOAL #2   Title Patient will be able to plantar flex foot 50 degrees bilaterally   Time 8   PT LONG TERM GOAL #3   Title Paqtient will be able to walk 30 minutes with CAM walker with pain <20 minutes with no AD.   Time 8   PT LONG TERM GOAL #4   Title Paqtient will be able to walk 60 minutes without CAM walker and withtou AD 50minutes without pain.    Time 12   PT LONG TERM GOAL #5   Title Patient will be able to stand >60 minutes on uneven terrain to fish.    Time 12   Additional Long Term Goals   Additional Long Term  Goals Yes   PT LONG TERM GOAL #6    Title Patient will dmeosntrate 5/5 ankle plantar flexion strength to be able to stand on toes to reach to a high shelf.    Time 12   Period Weeks   Status New   PT LONG TERM GOAL #7   Title Patient will demonstrate 5/5 ankle dorsiflexion strength to be able to decelerate foot flat durign gait.    Time 12   Period Weeks   Status New               Plan - 16-Apr-2014 1432    Clinical Impression Statement Patient displays Lt ankle stiffness s/p Lt ankle triple arthrodesis resulting in Lt ankle weakness and difficultyu walking >5 minutes. Patient will benefit from skilled phsyical therapy to increase ankle strength and stability and ROM to return to normal walkign without pain.    Pt will benefit from skilled therapeutic intervention in order to improve on the following deficits Abnormal gait;Decreased endurance;Decreased scar mobility;Improper body mechanics;Impaired flexibility;Decreased strength;Decreased activity tolerance;Difficulty walking;Decreased balance;Decreased range of motion   Rehab Potential Good   PT Frequency 2x / week   PT Duration 12 weeks   PT Treatment/Interventions Gait training;Passive range of motion;Patient/family education;Manual techniques;Therapeutic exercise;Balance training   PT Next Visit Plan Per MD attempt gait training with walker with CAM walker, introduce 3 way knee drive on 8" box for ankle ROM progressing to floor per pain, and introduce 3 way hamstring stretch with toe rotations.    PT Home Exercise Plan 4 way ankle with red Tband, ABC's, and ankle dorsiflexion Towel stretch.    Consulted and Agree with Plan of Care Patient          G-Codes - 2014/04/16 1512    Functional Assessment Tool Used FOTO 52% limited   Functional Limitation Mobility: Walking and moving around   Mobility: Walking and Moving Around Current Status [phone]) At least 40 percent but less than 60 percent impaired, limited or restricted   Mobility: Walking and Moving Around Goal  Status [phone]) At least 20 percent but less than 40 percent impaired, limited or restricted       Problem List There are no active problems to display for this patient.  Devona Konig PT DPT [place] [address] [place], [place], [address] Phone: [phone]   Fax:  [phone]

## 2014-03-24 ENCOUNTER — Ambulatory Visit (HOSPITAL_COMMUNITY)
Admission: RE | Admit: 2014-03-24 | Discharge: 2014-03-24 | Disposition: A | Discharge status: Home / Self Care | Payer: Medicare Other | Source: Ambulatory Visit | Attending: Podiatry | Admitting: Podiatry

## 2014-03-24 DIAGNOSIS — M25672 Stiffness of left ankle, not elsewhere classified: Secondary | ICD-10-CM

## 2014-03-24 DIAGNOSIS — R29898 Other symptoms and signs involving the musculoskeletal system: Secondary | ICD-10-CM

## 2014-03-24 DIAGNOSIS — Z981 Arthrodesis status: Secondary | ICD-10-CM

## 2014-03-24 DIAGNOSIS — R262 Difficulty in walking, not elsewhere classified: Secondary | ICD-10-CM

## 2014-03-24 NOTE — Therapy (Signed)
[place], [place], [address] Phone: [phone]   Fax:  [phone]  Physical Therapy Treatment  Patient Details  Name: Ethan Oliver MRN: [id number] Date of Birth: [birth year] Referring Provider:  Marcheta Grammes*  Encounter Date: 03/24/2014      PT End of Session - 03/24/14 1544    Visit Number 2   Number of Visits 24   Date for PT Re-Evaluation 04/21/14   Authorization Type Medicare   Authorization - Visit Number 2   Authorization - Number of Visits 10   PT Start Time 1518   PT Stop Time 1605   PT Time Calculation (min) 47 min   Activity Tolerance Patient tolerated treatment well   Behavior During Therapy [facility] for tasks assessed/performed      Past Medical History  Diagnosis Date  . Hypertension   . Hypercholesteremia   . Chronic back pain   . Depression   . Arthritis   . History of gout     Past Surgical History  Procedure Laterality Date  . Disk fusion      lumbar  . Knee surgery Right     Surgery on this knee twice.   . Carpal tunnel release Bilateral   . Foot arthrodesis Left 12/29/2013    Procedure: TRIPLE ARTHRODESIS FOOT;  Surgeon: Marcheta Grammes, DPM;  Location: [facility];  Service: Podiatry;  Laterality: Left;  . Achilles tendon surgery Left 12/29/2013    Procedure: PERCUTANEOUS TENDO-ACHILLES LENGTHENING;  Surgeon: Marcheta Grammes, DPM;  Location: [facility];  Service: Podiatry;  Laterality: Left;  . Calcaneal osteotomy with iliac crest bone graft and repair sublexing tendon and achilles tendon Left 12/29/2013    Procedure: CALCANEOCUBOID JOINT FUSION PERFORMED WITH BONE GRAFT;  Surgeon: Marcheta Grammes, DPM;  Location: [facility];  Service: Podiatry;  Laterality: Left;    There were no vitals taken for this visit.  Visit Diagnosis:  S/P ankle arthrodesis  Ankle weakness  Difficulty walking  Ankle stiffness, left      Subjective Assessment - 03/24/14 1531     Symptoms Pain free today, just a little tender/ sensitive today.  Pt reports complaince with weight bearing 75% and has been walking with walker or with scooter   Currently in Pain? No/denies                    [facility] Adult PT Treatment/Exercise - 03/24/14 1610    Exercises   Exercises Ankle   Ankle Exercises: Stretches   Gastroc Stretch 5 reps;10 seconds   Gastroc Stretch Limitations towel in long seated exercise   Other Stretch hamstring stretch on 17in box with IR/ER 3  directions 2 sets 3 direction   Other Stretch knee drive for dorsiflexion 10x 3 directions   Ankle Exercises: Standing   Other Standing Ankle Exercises Gait training with RW and CAM boot WB 75% per MD   Ankle Exercises: Seated   Towel Crunch 1 rep;Limitations   Towel Crunch Limitations add weight next session   Towel Inversion/Eversion 3 reps   Towel Inversion/Eversion Weights (lbs) add weight next session   Other Seated Ankle Exercises 4 way ankle theraband with blue tband 15x each                  PT Short Term Goals - 03/24/14 1605    PT SHORT TERM GOAL #1   Title Patient will be able to dorsiflex foot to 10 degrees  to decreaase early heel off during gait   Status On-going   PT SHORT TERM GOAL #2   Title Patient will be able to plantar flex foot >40 degrees bilaterally   Status On-going   PT SHORT TERM GOAL #3   Title Patient will be able to Stand both feet on the ground >71minutes to cook BBQ.    Status On-going   PT SHORT TERM GOAL #4   Title Paqtient will be able to walk 15 minutes with CAM walker with pain <20 minutes.   Status On-going           PT Long Term Goals - 03/24/14 1605    PT LONG TERM GOAL #1   Title Patient will be able to dorsiflex foot to 15 degrees to decreaase early heel off during gait   PT LONG TERM GOAL #2   Title Patient will be able to plantar flex foot 50 degrees bilaterally   PT LONG TERM GOAL #3   Title Paqtient will be able to walk 30 minutes  with CAM walker with pain <20 minutes with no AD.   PT LONG TERM GOAL #4   Title Paqtient will be able to walk 60 minutes without CAM walker and withtou AD 88minutes without pain.    PT LONG TERM GOAL #5   Title Patient will be able to stand >60 minutes on uneven terrain to fish.    PT LONG TERM GOAL #6   Title Patient will dmeosntrate 5/5 ankle plantar flexion strength to be able to stand on toes to reach to a high shelf.    PT LONG TERM GOAL #7   Title Patient will demonstrate 5/5 ankle dorsiflexion strength to be able to decelerate foot flat durign gait.                Plan - 03/24/14 1545    Clinical Impression Statement Began gait training with RW with CAM boot and weight bearing restrictions of 75%, pt able to demionstrate appropriate gait mechanics and proper weight bearing with boot on with min cueing initially for posture and weight bearing restrictins.  All standing exercises complete with CAM boot on or no weight bearing to imprvoe dorsiflexion.  Added seated towel exercises to improve intristic musculature strengthening and inversion/eversion with min cueing to reduce compesnaion of hip IR/ER.  No reports of pain.  Pt encouraged to apply ice with elevation for edema control.     PT Next Visit Plan Per MD attempt gait training with walker with CAM walker, introduce 3 way knee drive on 8" box for ankle ROM progressing to floor per pain, and introduce 3 way hamstring stretch with toe rotations.  Next session discuss holding OPPT until full weight bearing with increased HEP exercises due to pt unable to full weight bear for another 2 weeks, give towel exercises HEP.        Problem List There are no active problems to display for this patient.  Ihor Austin, [place]  Aldona Lento 03/24/2014, [place] [address] [place], [place], [address] Phone: [phone]   Fax:  [phone]

## 2014-03-29 ENCOUNTER — Ambulatory Visit (HOSPITAL_COMMUNITY)
Admission: RE | Admit: 2014-03-29 | Discharge: 2014-03-29 | Disposition: A | Discharge status: Home / Self Care | Payer: Medicare Other | Source: Ambulatory Visit | Attending: Podiatry | Admitting: Podiatry

## 2014-03-29 DIAGNOSIS — M25672 Stiffness of left ankle, not elsewhere classified: Secondary | ICD-10-CM | POA: Diagnosis not present

## 2014-03-29 DIAGNOSIS — Z981 Arthrodesis status: Secondary | ICD-10-CM

## 2014-03-29 DIAGNOSIS — R262 Difficulty in walking, not elsewhere classified: Secondary | ICD-10-CM

## 2014-03-29 DIAGNOSIS — R29898 Other symptoms and signs involving the musculoskeletal system: Secondary | ICD-10-CM

## 2014-03-29 NOTE — Therapy (Signed)
[place], [place], [address] Phone: [phone]   Fax:  [phone]  Physical Therapy Treatment  Patient Details  Name: Ethan Oliver MRN: [id number] Date of Birth: October 14, 1955 Referring Provider:  Marcheta Grammes*  Encounter Date: 03/29/2014      PT End of Session - 03/29/14 1309    Visit Number 3   Number of Visits 24   Date for PT Re-Evaluation 04/21/14   Authorization Type Medicare   Authorization - Visit Number 3   Authorization - Number of Visits 10   PT Start Time 1300   PT Stop Time 1342   PT Time Calculation (min) 42 min   Activity Tolerance Patient tolerated treatment well   Behavior During Therapy [facility] for tasks assessed/performed      Past Medical History  Diagnosis Date  . Hypertension   . Hypercholesteremia   . Chronic back pain   . Depression   . Arthritis   . History of gout     Past Surgical History  Procedure Laterality Date  . Disk fusion      lumbar  . Knee surgery Right     Surgery on this knee twice.   . Carpal tunnel release Bilateral   . Foot arthrodesis Left 12/29/2013    Procedure: TRIPLE ARTHRODESIS FOOT;  Surgeon: Marcheta Grammes, DPM;  Location: [facility];  Service: Podiatry;  Laterality: Left;  . Achilles tendon surgery Left 12/29/2013    Procedure: PERCUTANEOUS TENDO-ACHILLES LENGTHENING;  Surgeon: Marcheta Grammes, DPM;  Location: [facility];  Service: Podiatry;  Laterality: Left;  . Calcaneal osteotomy with iliac crest bone graft and repair sublexing tendon and achilles tendon Left 12/29/2013    Procedure: CALCANEOCUBOID JOINT FUSION PERFORMED WITH BONE GRAFT;  Surgeon: Marcheta Grammes, DPM;  Location: [facility];  Service: Podiatry;  Laterality: Left;    There were no vitals taken for this visit.  Visit Diagnosis:  S/P ankle arthrodesis  Ankle weakness  Difficulty walking  Ankle stiffness, left      Subjective Assessment - 03/29/14 1306     Symptoms Pain free today, did apply ice followiong last session that did assist with the swelling    Currently in Pain? No/denies                    [facility] Adult PT Treatment/Exercise - 03/29/14 0001    Exercises   Exercises Ankle   Ankle Exercises: Stretches   Gastroc Stretch 5 reps;10 seconds   Gastroc Stretch Limitations towel in long seated exercise   Other Stretch hamstring stretch on 14in box with IR/ER 3  directions 2 sets 3 direction   Other Stretch knee drive for dorsiflexion 10x 3 directions on 14in bix   Ankle Exercises: Standing   Other Standing Ankle Exercises Gait training with RW and CAM boot WB 75% per MD   Ankle Exercises: Seated   Towel Crunch 1 rep;Limitations   Towel Inversion/Eversion 3 reps   Towel Inversion/Eversion Weights (lbs) 2   BAPS Sitting;Level 3;10 reps   BAPS Weights (lbs) D/P, In/Ev, CW/CCW   Other Seated Ankle Exercises 4 way ankle theraband with blue tband 15x each                PT Education - 03/29/14 1324    Education Details Towel crunch and Inversion/eversion   Person(s) Educated Patient   Methods Explanation;Demonstration   Comprehension Verbalized understanding;Returned demonstration  PT Short Term Goals - 03/29/14 1309    PT SHORT TERM GOAL #1   Title Patient will be able to dorsiflex foot to 10 degrees to decreaase early heel off during gait   Status On-going   PT SHORT TERM GOAL #2   Title Patient will be able to plantar flex foot >40 degrees bilaterally   Status On-going   PT SHORT TERM GOAL #3   Title Patient will be able to Stand both feet on the ground >14minutes to cook BBQ.    Status On-going   PT SHORT TERM GOAL #4   Title Paqtient will be able to walk 15 minutes with CAM walker with pain <20 minutes.   Status On-going           PT Long Term Goals - 03/29/14 1310    PT LONG TERM GOAL #1   Title Patient will be able to dorsiflex foot to 15 degrees to decreaase early heel off  during gait   PT LONG TERM GOAL #2   Title Patient will be able to plantar flex foot 50 degrees bilaterally   PT LONG TERM GOAL #3   Title Paqtient will be able to walk 30 minutes with CAM walker with pain <20 minutes with no AD.   PT LONG TERM GOAL #4   Title Paqtient will be able to walk 60 minutes without CAM walker and withtou AD 28minutes without pain.    PT LONG TERM GOAL #5   Title Patient will be able to stand >60 minutes on uneven terrain to fish.    PT LONG TERM GOAL #6   Title Patient will dmeosntrate 5/5 ankle plantar flexion strength to be able to stand on toes to reach to a high shelf.    PT LONG TERM GOAL #7   Title Patient will demonstrate 5/5 ankle dorsiflexion strength to be able to decelerate foot flat durign gait.                Plan - 03/29/14 1310    Clinical Impression Statement Discussion held with evaluating PT, communication with MD indicatiing progressing towards increased weight bearing per pt. tolerance.  Decreased step height with knee drives to increase weight loading and dorsiflexion, pt tolerated well with no compliants.  Progressed ankle ROM exercises with BAPS board and able to add weight with towel inversion/eversion without difficutly   PT Next Visit Plan Cotninue with current PT POC, continue gait training wtih walker with CAM boot on, continue 3way knee drive for ankle ROM on lower height per pt pain tolerance (less than 14 in), continue hamstring 3 way stretch with toe rotations.  MD apt next week.        Problem List There are no active problems to display for this patient.  Ihor Austin, Omega   Aldona Lento 03/29/2014, 1:45 PM  [address] [place], [place], [address] Phone: [phone]   Fax:  [phone]

## 2014-04-01 ENCOUNTER — Ambulatory Visit (HOSPITAL_COMMUNITY): Payer: Medicare Other | Admitting: Physical Therapy

## 2014-04-05 ENCOUNTER — Ambulatory Visit (HOSPITAL_COMMUNITY)
Admission: RE | Admit: 2014-04-05 | Discharge: 2014-04-05 | Disposition: A | Discharge status: Home / Self Care | Payer: Medicare Other | Source: Ambulatory Visit | Attending: Podiatry | Admitting: Podiatry

## 2014-04-05 DIAGNOSIS — M25672 Stiffness of left ankle, not elsewhere classified: Secondary | ICD-10-CM | POA: Diagnosis not present

## 2014-04-05 DIAGNOSIS — Z981 Arthrodesis status: Secondary | ICD-10-CM

## 2014-04-05 DIAGNOSIS — R29898 Other symptoms and signs involving the musculoskeletal system: Secondary | ICD-10-CM

## 2014-04-05 DIAGNOSIS — R262 Difficulty in walking, not elsewhere classified: Secondary | ICD-10-CM

## 2014-04-05 NOTE — Therapy (Signed)
[place], [place], [address] Phone: [phone]   Fax:  [phone]  Physical Therapy Treatment  Patient Details  Name: Ethan Oliver MRN: [id number] Date of Birth: 03-Mar-1956 Referring Provider:  Marcheta Grammes*  Encounter Date: 04/05/2014      PT End of Session - 04/05/14 4401    Visit Number 4   Number of Visits 24   Date for PT Re-Evaluation 04/21/14   Authorization Type Medicare   Authorization - Visit Number 4   Authorization - Number of Visits 10   PT Start Time 0272   PT Stop Time 1433   PT Time Calculation (min) 48 min      Past Medical History  Diagnosis Date  . Hypertension   . Hypercholesteremia   . Chronic back pain   . Depression   . Arthritis   . History of gout     Past Surgical History  Procedure Laterality Date  . Disk fusion      lumbar  . Knee surgery Right     Surgery on this knee twice.   . Carpal tunnel release Bilateral   . Foot arthrodesis Left 12/29/2013    Procedure: TRIPLE ARTHRODESIS FOOT;  Surgeon: Marcheta Grammes, DPM;  Location: [facility];  Service: Podiatry;  Laterality: Left;  . Achilles tendon surgery Left 12/29/2013    Procedure: PERCUTANEOUS TENDO-ACHILLES LENGTHENING;  Surgeon: Marcheta Grammes, DPM;  Location: [facility];  Service: Podiatry;  Laterality: Left;  . Calcaneal osteotomy with iliac crest bone graft and repair sublexing tendon and achilles tendon Left 12/29/2013    Procedure: CALCANEOCUBOID JOINT FUSION PERFORMED WITH BONE GRAFT;  Surgeon: Marcheta Grammes, DPM;  Location: [facility];  Service: Podiatry;  Laterality: Left;    There were no vitals taken for this visit.  Visit Diagnosis:  S/P ankle arthrodesis  Ankle weakness  Difficulty walking  Ankle stiffness, left      Subjective Assessment - 04/05/14 1400    Symptoms Pt states that he is pain free he just feels like his foot is swollen   Currently in Pain? No/denies                     [facility] Adult PT Treatment/Exercise - 04/05/14 0001    Exercises   Exercises Ankle   Manual Therapy   Manual Therapy Edema management   Edema Management retro massage to entire Lt foot    Ankle Exercises: Stretches   Gastroc Stretch 5 reps;10 seconds   Gastroc Stretch Limitations towel in long seated exercise   Other Stretch hamstring stretch on 14in box with IR/ER 3  directions 2 sets 3 direction   Ankle Exercises: Seated   Towel Crunch 2 reps;Limitations   Towel Inversion/Eversion 3 reps   Towel Inversion/Eversion Weights (lbs) 3   Heel Raises 15 reps   Toe Raise 15 reps   BAPS Sitting;Level 3;10 reps   BAPS Weights (lbs) D/P, In/Ev, CW/CCW   Heel Slides 10 reps   Other Seated Ankle Exercises 4 way ankle theraband with blue tband 15x each   Other Seated Ankle Exercises  Dorsiflexion, inversion with 3 # x 10   Ankle Exercises: Sidelying   Ankle Eversion Strengthening;Left;10 reps                  PT Short Term Goals - 03/29/14 1309    PT SHORT TERM GOAL #1   Title Patient will be able to  dorsiflex foot to 10 degrees to decreaase early heel off during gait   Status On-going   PT SHORT TERM GOAL #2   Title Patient will be able to plantar flex foot >40 degrees bilaterally   Status On-going   PT SHORT TERM GOAL #3   Title Patient will be able to Stand both feet on the ground >64minutes to cook BBQ.    Status On-going   PT SHORT TERM GOAL #4   Title Paqtient will be able to walk 15 minutes with CAM walker with pain <20 minutes.   Status On-going           PT Long Term Goals - 03/29/14 1310    PT LONG TERM GOAL #1   Title Patient will be able to dorsiflex foot to 15 degrees to decreaase early heel off during gait   PT LONG TERM GOAL #2   Title Patient will be able to plantar flex foot 50 degrees bilaterally   PT LONG TERM GOAL #3   Title Paqtient will be able to walk 30 minutes with CAM walker with pain <20 minutes with no AD.    PT LONG TERM GOAL #4   Title Paqtient will be able to walk 60 minutes without CAM walker and withtou AD 93minutes without pain.    PT LONG TERM GOAL #5   Title Patient will be able to stand >60 minutes on uneven terrain to fish.    PT LONG TERM GOAL #6   Title Patient will dmeosntrate 5/5 ankle plantar flexion strength to be able to stand on toes to reach to a high shelf.    PT LONG TERM GOAL #7   Title Patient will demonstrate 5/5 ankle dorsiflexion strength to be able to decelerate foot flat durign gait.                Plan - 04/05/14 1437    Clinical Impression Statement Pt states he is still to be 75% weight bearing with camboot donned.  Added free weights to pt strengthening regieme.  Added manual edema control with  gentle PROM to increase ROM for ankle.   PT Next Visit Plan continue to progress ROM and strengthening for pt.         Problem List There are no active problems to display for this patient.   RUSSELL,CINDY PT 04/05/2014, 2:40 PM  [place], [place], [address] Phone: [phone]   Fax:  [phone]

## 2014-04-08 ENCOUNTER — Ambulatory Visit (HOSPITAL_COMMUNITY): Payer: Medicare Other | Admitting: Physical Therapy

## 2014-04-12 ENCOUNTER — Ambulatory Visit (HOSPITAL_COMMUNITY)
Admission: RE | Admit: 2014-04-12 | Discharge: 2014-04-12 | Disposition: A | Discharge status: Home / Self Care | Payer: Medicare Other | Source: Ambulatory Visit | Attending: Podiatry | Admitting: Podiatry

## 2014-04-12 DIAGNOSIS — M25672 Stiffness of left ankle, not elsewhere classified: Secondary | ICD-10-CM | POA: Diagnosis not present

## 2014-04-12 DIAGNOSIS — Z981 Arthrodesis status: Secondary | ICD-10-CM

## 2014-04-12 DIAGNOSIS — M6281 Muscle weakness (generalized): Secondary | ICD-10-CM | POA: Insufficient documentation

## 2014-04-12 DIAGNOSIS — R262 Difficulty in walking, not elsewhere classified: Secondary | ICD-10-CM | POA: Diagnosis not present

## 2014-04-12 DIAGNOSIS — R29898 Other symptoms and signs involving the musculoskeletal system: Secondary | ICD-10-CM

## 2014-04-12 NOTE — Therapy (Signed)
[place], [place], [address] Phone: [phone]   Fax:  [phone]  Physical Therapy Treatment  Patient Details  Name: Ethan Oliver MRN: [id number] Date of Birth: [birth year] Referring Provider:  Marcheta Grammes*  Encounter Date: 04/12/2014      PT End of Session - 04/12/14 1444    Visit Number 5   Number of Visits 24   Date for PT Re-Evaluation 05/20/14   Authorization Type Medicare   Authorization - Visit Number 5   Authorization - Number of Visits 10   PT Start Time 1350   PT Stop Time 1430   PT Time Calculation (min) 40 min   Activity Tolerance Patient tolerated treatment well   Behavior During Therapy [facility] for tasks assessed/performed      Past Medical History  Diagnosis Date  . Hypertension   . Hypercholesteremia   . Chronic back pain   . Depression   . Arthritis   . History of gout     Past Surgical History  Procedure Laterality Date  . Disk fusion      lumbar  . Knee surgery Right     Surgery on this knee twice.   . Carpal tunnel release Bilateral   . Foot arthrodesis Left 12/29/2013    Procedure: TRIPLE ARTHRODESIS FOOT;  Surgeon: Marcheta Grammes, DPM;  Location: [facility];  Service: Podiatry;  Laterality: Left;  . Achilles tendon surgery Left 12/29/2013    Procedure: PERCUTANEOUS TENDO-ACHILLES LENGTHENING;  Surgeon: Marcheta Grammes, DPM;  Location: [facility];  Service: Podiatry;  Laterality: Left;  . Calcaneal osteotomy with iliac crest bone graft and repair sublexing tendon and achilles tendon Left 12/29/2013    Procedure: CALCANEOCUBOID JOINT FUSION PERFORMED WITH BONE GRAFT;  Surgeon: Marcheta Grammes, DPM;  Location: [facility];  Service: Podiatry;  Laterality: Left;    There were no vitals taken for this visit.  Visit Diagnosis:  S/P ankle arthrodesis  Ankle weakness  Difficulty walking  Ankle stiffness, left      Subjective Assessment - 04/12/14 1357     Symptoms Pt states moderate complaints of foot/ankle pain today; pt does report MD stated pain would start to increase after ~3 months as WB increased.    Currently in Pain? Yes   Pain Score 5    Pain Location Ankle   Pain Orientation Left   Pain Descriptors / Indicators Aching          [facility] PT Assessment - 04/12/14 0001    Assessment   Medical Diagnosis S/p triple arthrodesis Lt foot.    Onset Date 12/30/14   Next MD Visit B Caprice Beaver 04/26/14                  [facility] Adult PT Treatment/Exercise - 04/12/14 0001    Exercises   Exercises Ankle   Ankle Exercises: Stretches   Soleus Stretch 2 reps;30 seconds   Soleus Stretch Limitations long sit with rope and bolster   Gastroc Stretch 2 reps;30 seconds   Gastroc Stretch Limitations long with rope   Ankle Exercises: Seated   Ankle Circles/Pumps 10 reps;Left   Ankle Circles/Pumps Limitations 3#   Heel Raises 20 reps   Toe Raise 20 reps   BAPS Sitting;Level 3;10 reps   BAPS Weights (lbs) D/P, In/Ev, CW/CCW   Other Seated Ankle Exercises 4 way ankle, BTB, 2x10   Other Seated Ankle Exercises  Dorsiflexion, inversion with 3 # x 10  PT Short Term Goals - 03/29/14 1309    PT SHORT TERM GOAL #1   Title Patient will be able to dorsiflex foot to 10 degrees to decreaase early heel off during gait   Status On-going   PT SHORT TERM GOAL #2   Title Patient will be able to plantar flex foot >40 degrees bilaterally   Status On-going   PT SHORT TERM GOAL #3   Title Patient will be able to Stand both feet on the ground >9minutes to cook BBQ.    Status On-going   PT SHORT TERM GOAL #4   Title Paqtient will be able to walk 15 minutes with CAM walker with pain <20 minutes.   Status On-going           PT Long Term Goals - 03/29/14 1310    PT LONG TERM GOAL #1   Title Patient will be able to dorsiflex foot to 15 degrees to decreaase early heel off during gait   PT LONG TERM GOAL #2   Title Patient  will be able to plantar flex foot 50 degrees bilaterally   PT LONG TERM GOAL #3   Title Paqtient will be able to walk 30 minutes with CAM walker with pain <20 minutes with no AD.   PT LONG TERM GOAL #4   Title Paqtient will be able to walk 60 minutes without CAM walker and withtou AD 31minutes without pain.    PT LONG TERM GOAL #5   Title Patient will be able to stand >60 minutes on uneven terrain to fish.    PT LONG TERM GOAL #6   Title Patient will dmeosntrate 5/5 ankle plantar flexion strength to be able to stand on toes to reach to a high shelf.    PT LONG TERM GOAL #7   Title Patient will demonstrate 5/5 ankle dorsiflexion strength to be able to decelerate foot flat durign gait.                Plan - 04/12/14 1445    Clinical Impression Statement Continued exercise program, increasing reps of strengthening exercises with theraband to tolerance.  Noted difficulty with inversion and eversion secondary to surgical procedure, with manual assist to control movement of LE to allow for movement of ankle only.  Decreased visits to 1x/wk for this week and next until MD visit, when orders may be changed for increasing WB or d/c CAM boot.    Pt will benefit from skilled therapeutic intervention in order to improve on the following deficits Abnormal gait;Decreased endurance;Decreased scar mobility;Improper body mechanics;Impaired flexibility;Decreased strength;Decreased activity tolerance;Difficulty walking;Decreased balance;Decreased range of motion   Rehab Potential Good   PT Frequency 2x / week   PT Duration 12 weeks   PT Treatment/Interventions Gait training;Passive range of motion;Patient/family education;Manual techniques;Therapeutic exercise;Balance training   PT Next Visit Plan continue to progress ROM and strengthening for pt.         Problem List There are no active problems to display for this patient.   Ethan Oliver, DPT [phone]  04/12/2014, 2:50 PM  [facility] [address] [place], [place], [address] Phone: [phone]   Fax:  [phone]

## 2014-04-15 ENCOUNTER — Encounter (HOSPITAL_COMMUNITY): Payer: Medicare Other | Admitting: Physical Therapy

## 2014-04-19 ENCOUNTER — Ambulatory Visit (HOSPITAL_COMMUNITY): Payer: Medicare Other

## 2014-04-22 ENCOUNTER — Ambulatory Visit (HOSPITAL_COMMUNITY): Payer: Medicare Other | Admitting: Physical Therapy

## 2014-04-22 ENCOUNTER — Telehealth (HOSPITAL_COMMUNITY): Payer: Self-pay | Admitting: Physical Therapy

## 2014-04-22 ENCOUNTER — Encounter (HOSPITAL_COMMUNITY): Payer: Medicare Other | Admitting: Specialist

## 2014-04-22 NOTE — Telephone Encounter (Signed)
Wife had a death in the family and they can not come in today

## 2014-04-26 ENCOUNTER — Ambulatory Visit (HOSPITAL_COMMUNITY)
Admission: RE | Admit: 2014-04-26 | Discharge: 2014-04-26 | Disposition: A | Discharge status: Home / Self Care | Payer: Medicare Other | Source: Ambulatory Visit | Attending: Podiatry | Admitting: Podiatry

## 2014-04-26 DIAGNOSIS — M25672 Stiffness of left ankle, not elsewhere classified: Secondary | ICD-10-CM | POA: Diagnosis not present

## 2014-04-26 DIAGNOSIS — R29898 Other symptoms and signs involving the musculoskeletal system: Secondary | ICD-10-CM

## 2014-04-26 DIAGNOSIS — R262 Difficulty in walking, not elsewhere classified: Secondary | ICD-10-CM

## 2014-04-26 DIAGNOSIS — Z981 Arthrodesis status: Secondary | ICD-10-CM

## 2014-04-26 NOTE — Therapy (Signed)
[place], [place], [address] Phone: [phone]   Fax:  [phone]  Physical Therapy Treatment  Patient Details  Name: Ethan Oliver MRN: [id number] Date of Birth: December 20, 1955 Referring Provider:  Marcheta Grammes*  Encounter Date: 04/26/2014      PT End of Session - 04/26/14 1433    Visit Number 6   Number of Visits 24   Date for PT Re-Evaluation 05/20/14   Authorization Type Medicare   Authorization - Visit Number 6   Authorization - Number of Visits 10   PT Start Time 1350   PT Stop Time 1433   PT Time Calculation (min) 43 min   Activity Tolerance Patient tolerated treatment well   Behavior During Therapy [facility] for tasks assessed/performed      Past Medical History  Diagnosis Date  . Hypertension   . Hypercholesteremia   . Chronic back pain   . Depression   . Arthritis   . History of gout     Past Surgical History  Procedure Laterality Date  . Disk fusion      lumbar  . Knee surgery Right     Surgery on this knee twice.   . Carpal tunnel release Bilateral   . Foot arthrodesis Left 12/29/2013    Procedure: TRIPLE ARTHRODESIS FOOT;  Surgeon: Marcheta Grammes, DPM;  Location: [facility];  Service: Podiatry;  Laterality: Left;  . Achilles tendon surgery Left 12/29/2013    Procedure: PERCUTANEOUS TENDO-ACHILLES LENGTHENING;  Surgeon: Marcheta Grammes, DPM;  Location: [facility];  Service: Podiatry;  Laterality: Left;  . Calcaneal osteotomy with iliac crest bone graft and repair sublexing tendon and achilles tendon Left 12/29/2013    Procedure: CALCANEOCUBOID JOINT FUSION PERFORMED WITH BONE GRAFT;  Surgeon: Marcheta Grammes, DPM;  Location: [facility];  Service: Podiatry;  Laterality: Left;    There were no vitals taken for this visit.  Visit Diagnosis:  S/P ankle arthrodesis  Ankle weakness  Ankle stiffness, left  Difficulty walking      Subjective Assessment - 04/26/14 1356     Symptoms Went to MD earlier today, brought in order to advance to FWB out of CAM walker.  Currently pain free today.     Currently in Pain? No/denies                    [facility] Adult PT Treatment/Exercise - 04/26/14 1402    Exercises   Exercises Ankle   Manual Therapy   Manual Therapy Edema management   Edema Management Retro massage with Lt LE Elevated   Ankle Exercises: Stretches   Gastroc Stretch 3 reps;30 seconds   Gastroc Stretch Limitations standing against wall 3 directions   Other Stretch hamstring stretch on 14in box with IR/ER 3  directions 2 sets 3 direction   Other Stretch knee drive for dorsiflexion 10x 3 directions on 8in box   Ankle Exercises: Standing   Other Standing Ankle Exercises Gait training no AD out of CAM boot x 226 feet   Other Standing Ankle Exercises 3D ankle excursion 5x each   Ankle Exercises: Seated   Ankle Circles/Pumps 10 reps;Left   Ankle Circles/Pumps Limitations 3#                  PT Short Term Goals - 04/26/14 1440    PT SHORT TERM GOAL #1   Title Patient will be able to dorsiflex foot to 10 degrees to decreaase early  heel off during gait   Status On-going   PT SHORT TERM GOAL #2   Title Patient will be able to plantar flex foot >40 degrees bilaterally   Status On-going   PT SHORT TERM GOAL #3   Title Patient will be able to Stand both feet on the ground >31minutes to cook BBQ.    Status On-going   PT SHORT TERM GOAL #4   Title Paqtient will be able to walk 15 minutes with CAM walker with pain <20 minutes.   Status On-going           PT Long Term Goals - 04/26/14 1441    PT LONG TERM GOAL #1   Title Patient will be able to dorsiflex foot to 15 degrees to decreaase early heel off during gait   Status On-going   PT LONG TERM GOAL #2   Title Patient will be able to plantar flex foot 50 degrees bilaterally   PT LONG TERM GOAL #3   Title Paqtient will be able to walk 30 minutes with CAM walker with pain <20  minutes with no AD.   PT LONG TERM GOAL #4   Title Paqtient will be able to walk 60 minutes without CAM walker and withtou AD 51minutes without pain.    Status On-going   PT LONG TERM GOAL #5   Title Patient will be able to stand >60 minutes on uneven terrain to fish.    PT LONG TERM GOAL #6   Title Patient will dmeosntrate 5/5 ankle plantar flexion strength to be able to stand on toes to reach to a high shelf.    Status On-going   PT LONG TERM GOAL #7   Title Patient will demonstrate 5/5 ankle dorsiflexion strength to be able to decelerate foot flat durign gait.    Status On-going               Plan - 04/26/14 1434    Clinical Impression Statement Received order to progress to weight bearing with no CAM boot this session.  Progressed standing exercises and gait training without CAM boot this session.  Added 3D ankle excursion for ankle ROM.  No reports of pain through session.  Manual techniques complete for edema control.  Pt encouraged to elevate and apply ice on ankle later today for edema control following increased weight bearing without CAM.     PT Next Visit Plan Continue with current PT POC progress gait, ROM and strengthening in standing   Next session add 3D hip excursion and begin total gym for heel and toe raises.        Problem List There are no active problems to display for this patient.  Ihor Austin, [place]   Aldona Lento 04/26/2014, 2:43 PM  [place] [address] [place], [place], [address] Phone: [phone]   Fax:  [phone]

## 2014-04-29 ENCOUNTER — Ambulatory Visit (HOSPITAL_COMMUNITY): Payer: Medicare Other | Admitting: Physical Therapy

## 2014-04-29 DIAGNOSIS — Z981 Arthrodesis status: Secondary | ICD-10-CM

## 2014-04-29 DIAGNOSIS — M25672 Stiffness of left ankle, not elsewhere classified: Secondary | ICD-10-CM

## 2014-04-29 DIAGNOSIS — R262 Difficulty in walking, not elsewhere classified: Secondary | ICD-10-CM

## 2014-04-29 DIAGNOSIS — R29898 Other symptoms and signs involving the musculoskeletal system: Secondary | ICD-10-CM

## 2014-04-29 NOTE — Therapy (Signed)
[facility] [address] [place], [place], [address] Phone: [phone]   Fax:  [phone]  Physical Therapy Treatment  Patient Details  Name: Ethan Oliver MRN: [id number] Date of Birth: [birth year] Referring Provider:  Marcheta Grammes*  Encounter Date: 04/29/2014      PT End of Session - 04/29/14 1448    Visit Number 7   Number of Visits 24   Date for PT Re-Evaluation 05/20/14   Authorization Type Medicare   Authorization - Visit Number 7   Authorization - Number of Visits 10   PT Start Time 7893   PT Stop Time 1433   PT Time Calculation (min) 41 min   Activity Tolerance Patient tolerated treatment well   Behavior During Therapy [facility] for tasks assessed/performed      Past Medical History  Diagnosis Date  . Hypertension   . Hypercholesteremia   . Chronic back pain   . Depression   . Arthritis   . History of gout     Past Surgical History  Procedure Laterality Date  . Disk fusion      lumbar  . Knee surgery Right     Surgery on this knee twice.   . Carpal tunnel release Bilateral   . Foot arthrodesis Left 12/29/2013    Procedure: TRIPLE ARTHRODESIS FOOT;  Surgeon: Marcheta Grammes, DPM;  Location: [facility];  Service: Podiatry;  Laterality: Left;  . Achilles tendon surgery Left 12/29/2013    Procedure: PERCUTANEOUS TENDO-ACHILLES LENGTHENING;  Surgeon: Marcheta Grammes, DPM;  Location: [facility];  Service: Podiatry;  Laterality: Left;  . Calcaneal osteotomy with iliac crest bone graft and repair sublexing tendon and achilles tendon Left 12/29/2013    Procedure: CALCANEOCUBOID JOINT FUSION PERFORMED WITH BONE GRAFT;  Surgeon: Marcheta Grammes, DPM;  Location: [facility];  Service: Podiatry;  Laterality: Left;    There were no vitals taken for this visit.  Visit Diagnosis:  S/P ankle arthrodesis  Ankle weakness  Ankle stiffness, left  Difficulty walking        [facility] Adult PT Treatment/Exercise  - 04/29/14 0001    Ankle Exercises: Standing   BAPS Level 2;Standing;10 reps;Limitations   BAPS Limitations A/P Lt/Rt, CW/CCW 10x each    Other Standing Ankle Exercises Total gym: 11 degrees 3x10 unlateral alf raises   Other Standing Ankle Exercises 3D ankle excursion 5x each, SFT squat with frontal plane neutral 5x each    Ankle Exercises: Stretches   Soleus Stretch 20 seconds;3 reps   Soleus Stretch Limitations standing 3 way.    Gastroc Stretch 20 seconds;2 reps   Gastroc Stretch Limitations standing 3 way   Other Stretch Piriformis stretch  performed by therapist 3x 20 seconds.    Other Stretch knee drive for dorsiflexion 10x 3 directions on 8in box   Ankle Exercises: Seated   BAPS Sitting;Level 3;10 reps   BAPS Weights (lbs) D/P, In/Ev, CW/CCW           PT Short Term Goals - 04/29/14 1445    PT SHORT TERM GOAL #1   Title Patient will be able to dorsiflex foot to 10 degrees to decreaase early heel off during gait   Status On-going   PT SHORT TERM GOAL #2   Title Patient will be able to plantar flex foot >40 degrees bilaterally   Status On-going   PT SHORT TERM GOAL #3   Title Patient will be able to Stand both feet on the ground >79minutes  to cook BBQ.    Status On-going   PT SHORT TERM GOAL #4   Title Paqtient will be able to walk 15 minutes with CAM walker with pain <20 minutes.   Status On-going           PT Long Term Goals - 04/29/14 1446    PT LONG TERM GOAL #1   Title Patient will be able to dorsiflex foot to 15 degrees to decreaase early heel off during gait   Status On-going   PT LONG TERM GOAL #2   Title Patient will be able to plantar flex foot 50 degrees bilaterally   Status On-going   PT LONG TERM GOAL #3   Title Paqtient will be able to walk 30 minutes with CAM walker with pain <20 minutes with no AD.   Status On-going   PT LONG TERM GOAL #4   Title Paqtient will be able to walk 60 minutes without CAM walker and withtou AD 38minutes without pain.     Status On-going   PT LONG TERM GOAL #6   Title Patient will dmeosntrate 5/5 ankle plantar flexion strength to be able to stand on toes to reach to a high shelf.    Status On-going   PT LONG TERM GOAL #7   Title Patient will demonstrate 5/5 ankle dorsiflexion strength to be able to decelerate foot flat durign gait.    Status On-going               Plan - 04/29/14 1425    Clinical Impression Statement Continued to increase weight bearing exercises and progress calf strength. Squats introduced to increaseweight bearign exercise. complaints of stiffness and weakness throughout no pain. Verbal cuses given for correct loading sduring sqquats but patient able to complete with good form.    PT Next Visit Plan Continue with current PT POC progress gait, ROM and strengthening in standing.  continue total gym heel toe raises and squats.    Consulted and Agree with Plan of Care Patient        Problem List There are no active problems to display for this patient.  Devona Konig PT DPT [facility] [address] [place], [place], [address] Phone: [phone]   Fax:  [phone]

## 2014-05-03 ENCOUNTER — Ambulatory Visit (HOSPITAL_COMMUNITY): Payer: Medicare Other

## 2014-05-03 DIAGNOSIS — M25672 Stiffness of left ankle, not elsewhere classified: Secondary | ICD-10-CM

## 2014-05-03 DIAGNOSIS — R29898 Other symptoms and signs involving the musculoskeletal system: Secondary | ICD-10-CM

## 2014-05-03 DIAGNOSIS — Z981 Arthrodesis status: Secondary | ICD-10-CM

## 2014-05-03 DIAGNOSIS — R262 Difficulty in walking, not elsewhere classified: Secondary | ICD-10-CM

## 2014-05-03 NOTE — Therapy (Signed)
[facility] [place], [place], [address] Phone: [phone]   Fax:  [phone]  Physical Therapy Treatment  Patient Details  Name: Ethan Oliver MRN: [id number] Date of Birth: 11-16-1955 Referring Provider:  Marcheta Grammes*  Encounter Date: 05/03/2014      PT End of Session - 05/03/14 1423    Visit Number 8   Number of Visits 24   Date for PT Re-Evaluation 05/20/14   Authorization Type Medicare   Authorization - Visit Number 8   Authorization - Number of Visits 10   PT Start Time 7169   PT Stop Time 1428   PT Time Calculation (min) 40 min   Activity Tolerance Patient tolerated treatment well   Behavior During Therapy [facility] for tasks assessed/performed      Past Medical History  Diagnosis Date  . Hypertension   . Hypercholesteremia   . Chronic back pain   . Depression   . Arthritis   . History of gout     Past Surgical History  Procedure Laterality Date  . Disk fusion      lumbar  . Knee surgery Right     Surgery on this knee twice.   . Carpal tunnel release Bilateral   . Foot arthrodesis Left 12/29/2013    Procedure: TRIPLE ARTHRODESIS FOOT;  Surgeon: Marcheta Grammes, DPM;  Location: [facility];  Service: Podiatry;  Laterality: Left;  . Achilles tendon surgery Left 12/29/2013    Procedure: PERCUTANEOUS TENDO-ACHILLES LENGTHENING;  Surgeon: Marcheta Grammes, DPM;  Location: [facility];  Service: Podiatry;  Laterality: Left;  . Calcaneal osteotomy with iliac crest bone graft and repair sublexing tendon and achilles tendon Left 12/29/2013    Procedure: CALCANEOCUBOID JOINT FUSION PERFORMED WITH BONE GRAFT;  Surgeon: Marcheta Grammes, DPM;  Location: [facility];  Service: Podiatry;  Laterality: Left;    There were no vitals taken for this visit.  Visit Diagnosis:  S/P ankle arthrodesis  Ankle weakness  Ankle stiffness, left  Difficulty walking      Subjective Assessment - 05/03/14 1349     Symptoms Pt stated he was sore following the squat exercises, reports complaince with HEP daily and squats every other day.  Current pain scale 3-4/10   Currently in Pain? Yes   Pain Score 3    Pain Location Ankle   Pain Orientation Left;Lateral   Pain Descriptors / Indicators Aching                    [facility] Adult PT Treatment/Exercise - 05/03/14 1422    Exercises   Exercises Ankle   Ankle Exercises: Stretches   Soleus Stretch 20 seconds;3 reps   Soleus Stretch Limitations standing 3 way.    Gastroc Stretch 20 seconds;2 reps   Gastroc Stretch Limitations standing 3 way, slant board   Other Stretch Piriformis stretch  performed by therapist 3x 20 seconds.    Other Stretch knee drive for dorsiflexion 10x 3 directions on 8in box   Ankle Exercises: Standing   BAPS Level 2;Standing;10 reps;Limitations   BAPS Limitations A/P Lt/Rt, CW/CCW 10x each    Other Standing Ankle Exercises Total gym: 11 degrees 3x10 unlateral alf raises   Other Standing Ankle Exercises 3D ankle excursion 5x each, SFT squat with frontal plane neutral 5x each                   PT Short Term Goals - 05/03/14 1429  PT SHORT TERM GOAL #1   Title Patient will be able to dorsiflex foot to 10 degrees to decreaase early heel off during gait   Status On-going   PT SHORT TERM GOAL #2   Title Patient will be able to plantar flex foot >40 degrees bilaterally   Status On-going   PT SHORT TERM GOAL #3   Title Patient will be able to Stand both feet on the ground >80minutes to cook BBQ.    Status On-going   PT SHORT TERM GOAL #4   Title Paqtient will be able to walk 15 minutes with CAM walker with pain <20 minutes.   Status On-going           PT Long Term Goals - 05/03/14 1429    PT LONG TERM GOAL #1   Title Patient will be able to dorsiflex foot to 15 degrees to decreaase early heel off during gait   Status On-going   PT LONG TERM GOAL #2   Title Patient will be able to plantar flex  foot 50 degrees bilaterally   Status On-going   PT LONG TERM GOAL #3   Title Paqtient will be able to walk 30 minutes with CAM walker with pain <20 minutes with no AD.   Status On-going   PT LONG TERM GOAL #4   Title Paqtient will be able to walk 60 minutes without CAM walker and withtou AD 58minutes without pain.    PT LONG TERM GOAL #5   Title Patient will be able to stand >60 minutes on uneven terrain to fish.    PT LONG TERM GOAL #6   Title Patient will dmeosntrate 5/5 ankle plantar flexion strength to be able to stand on toes to reach to a high shelf.    Status On-going   PT LONG TERM GOAL #7   Title Patient will demonstrate 5/5 ankle dorsiflexion strength to be able to decelerate foot flat durign gait.    Status On-going               Plan - 05/03/14 1424    Clinical Impression Statement Increase weight bearing with standing BAPS and manual assistance for full ROM with activity, pt most limited with eversion and dorsiflexion.  Increased degrees with power tower for increased weight bearing as well.  Continue excursion and drives to improve dorsi and plantarflexion.  Pt  able to demonstrate good form and loading with squats today on power tower.  Pt limited by fatigue at end of session reporting increase ankle soreness.  Pt encouraged to apply ice with elevation at home for pain and edema control, pain scale 3-4/10 at end of session.     PT Next Visit Plan Continue with current PT POC progress gait, ROM and strengthening in standing.  continue total gym heel toe raises and squats.         Problem List There are no active problems to display for this patient.  Ihor Austin, [place]  Aldona Lento 05/03/2014, 2:31 PM  [facility] [address] [place], [place], [address] Phone: [phone]   Fax:  [phone]

## 2014-05-06 ENCOUNTER — Telehealth (HOSPITAL_COMMUNITY): Payer: Self-pay

## 2014-05-06 ENCOUNTER — Ambulatory Visit (HOSPITAL_COMMUNITY): Payer: Medicare Other

## 2014-05-06 NOTE — Telephone Encounter (Signed)
His ankle is swollen since PT yesterday and he will not come in today. Patient stated that he has his ankle elevated and is icing. If PT has other instruction please call patient

## 2014-05-10 ENCOUNTER — Ambulatory Visit (HOSPITAL_COMMUNITY): Payer: Medicare Other | Attending: Podiatry

## 2014-05-10 DIAGNOSIS — M6281 Muscle weakness (generalized): Secondary | ICD-10-CM | POA: Insufficient documentation

## 2014-05-10 DIAGNOSIS — M25672 Stiffness of left ankle, not elsewhere classified: Secondary | ICD-10-CM | POA: Diagnosis not present

## 2014-05-10 DIAGNOSIS — Z981 Arthrodesis status: Secondary | ICD-10-CM

## 2014-05-10 DIAGNOSIS — R262 Difficulty in walking, not elsewhere classified: Secondary | ICD-10-CM | POA: Diagnosis not present

## 2014-05-10 DIAGNOSIS — R29898 Other symptoms and signs involving the musculoskeletal system: Secondary | ICD-10-CM

## 2014-05-10 NOTE — Therapy (Signed)
[facility] [place], [place], [address] Phone: [phone]   Fax:  [phone]  Physical Therapy Treatment  Patient Details  Name: Ethan Oliver MRN: [id number] Date of Birth: [birth year] Referring Provider:  Marcheta Grammes*  Encounter Date: 05/10/2014      PT End of Session - 05/10/14 1354    Visit Number 9   Number of Visits 24   Date for PT Re-Evaluation 05/20/14   Authorization Type Medicare   Authorization - Visit Number 9   Authorization - Number of Visits 10   PT Start Time 9563   PT Stop Time 1430   PT Time Calculation (min) 42 min   Activity Tolerance Patient tolerated treatment well;Patient limited by pain   Behavior During Therapy [facility] for tasks assessed/performed      Past Medical History  Diagnosis Date  . Hypertension   . Hypercholesteremia   . Chronic back pain   . Depression   . Arthritis   . History of gout     Past Surgical History  Procedure Laterality Date  . Disk fusion      lumbar  . Knee surgery Right     Surgery on this knee twice.   . Carpal tunnel release Bilateral   . Foot arthrodesis Left 12/29/2013    Procedure: TRIPLE ARTHRODESIS FOOT;  Surgeon: Marcheta Grammes, DPM;  Location: [facility];  Service: Podiatry;  Laterality: Left;  . Achilles tendon surgery Left 12/29/2013    Procedure: PERCUTANEOUS TENDO-ACHILLES LENGTHENING;  Surgeon: Marcheta Grammes, DPM;  Location: [facility];  Service: Podiatry;  Laterality: Left;  . Calcaneal osteotomy with iliac crest bone graft and repair sublexing tendon and achilles tendon Left 12/29/2013    Procedure: CALCANEOCUBOID JOINT FUSION PERFORMED WITH BONE GRAFT;  Surgeon: Marcheta Grammes, DPM;  Location: [facility];  Service: Podiatry;  Laterality: Left;    There were no vitals taken for this visit.  Visit Diagnosis:  S/P ankle arthrodesis  Ankle weakness  Ankle stiffness, left  Difficulty walking      Subjective  Assessment - 05/10/14 1352    Symptoms Pt stated increased swelling and soreness following the squat exercises last session.  Has been iceing and elevating at home.  Pain scale 6/10   Currently in Pain? Yes   Pain Score 6    Pain Location Ankle   Pain Orientation Left;Lateral   Pain Descriptors / Indicators Aching          [facility] PT Assessment - 05/10/14 0001    Assessment   Medical Diagnosis S/p triple arthrodesis Lt foot.    Onset Date 12/30/14   Next MD Visit B Caprice Beaver 05/24/2014   Prior Therapy no                   [facility] Adult PT Treatment/Exercise - 05/10/14 0001    Exercises   Exercises Ankle   Manual Therapy   Manual Therapy Edema management   Edema Management Retro massage with Lt LE Elevated/ PROM within tolerance   Ankle Exercises: Stretches   Soleus Stretch 3 reps;30 seconds   Soleus Stretch Limitations supine with rope knee flexed   Gastroc Stretch 3 reps;30 seconds   Gastroc Stretch Limitations long sitting with rope   Other Stretch Piriformis stretch  performed by therapist 3x 30 seconds.    Other Stretch knee drive for dorsiflexion 10x 3 directions on 8in box   Ankle Exercises: Standing   Other Standing Ankle  Exercises 3D ankle excursion 10x   Ankle Exercises: Seated   BAPS Sitting;Level 4;10 reps;Limitations   BAPS Weights (lbs) A/P, R/L, CW/CCW                  PT Short Term Goals - 05/10/14 1354    PT SHORT TERM GOAL #1   Title Patient will be able to dorsiflex foot to 10 degrees to decreaase early heel off during gait   Status On-going   PT SHORT TERM GOAL #2   Title Patient will be able to plantar flex foot >40 degrees bilaterally   Status On-going   PT SHORT TERM GOAL #3   Title Patient will be able to Stand both feet on the ground >21minutes to cook BBQ.    PT SHORT TERM GOAL #4   Title Paqtient will be able to walk 15 minutes with CAM walker with pain <20 minutes.           PT Long Term Goals - 05/10/14 1355    PT  LONG TERM GOAL #1   Title Patient will be able to dorsiflex foot to 15 degrees to decreaase early heel off during gait   PT LONG TERM GOAL #2   Title Patient will be able to plantar flex foot 50 degrees bilaterally   PT LONG TERM GOAL #3   Title Paqtient will be able to walk 30 minutes with CAM walker with pain <20 minutes with no AD.   PT LONG TERM GOAL #4   Title Paqtient will be able to walk 60 minutes without CAM walker and withtou AD 55minutes without pain.    PT LONG TERM GOAL #5   Title Patient will be able to stand >60 minutes on uneven terrain to fish.    PT LONG TERM GOAL #6   Title Patient will dmeosntrate 5/5 ankle plantar flexion strength to be able to stand on toes to reach to a high shelf.    PT LONG TERM GOAL #7   Title Patient will demonstrate 5/5 ankle dorsiflexion strength to be able to decelerate foot flat durign gait.                Plan - 05/10/14 1410    Clinical Impression Statement Pt limited by pain this session, reduced weight bearing activities and increased stretching time for pain control and to improve AROM.  Manual techniques complete at end of session for edema control and PROM within pt. tolerance.  Pt stated pain reduced to 3-4/10. Pt encouraged to reduce weight bearing today and to apply ice with elevation with ankle pumps for pain and edema control.   PT Next Visit Plan Continue with current PT POC progress gait, ROM and strengthening in standing.  continue total gym heel toe raises and squats.    GCode due next session.          Problem List There are no active problems to display for this patient.  Ihor Austin, [place]  Aldona Lento 05/10/2014, 2:32 PM  [facility] [address] [place], [place], [address] Phone: [phone]   Fax:  [phone]

## 2014-05-13 ENCOUNTER — Ambulatory Visit (HOSPITAL_COMMUNITY): Payer: Medicare Other | Admitting: Physical Therapy

## 2014-05-13 DIAGNOSIS — Z981 Arthrodesis status: Secondary | ICD-10-CM

## 2014-05-13 DIAGNOSIS — M25672 Stiffness of left ankle, not elsewhere classified: Secondary | ICD-10-CM | POA: Diagnosis not present

## 2014-05-13 DIAGNOSIS — R29898 Other symptoms and signs involving the musculoskeletal system: Secondary | ICD-10-CM

## 2014-05-13 DIAGNOSIS — R262 Difficulty in walking, not elsewhere classified: Secondary | ICD-10-CM

## 2014-05-13 NOTE — Therapy (Signed)
[place], [place], [address] Phone: [phone]   Fax:  [phone]  Physical Therapy Treatment  Patient Details  Name: Ethan Oliver MRN: [id number] Date of Birth: [birth year] Referring Provider:  Marcheta Grammes*  Encounter Date: 05/13/2014      PT End of Session - 05/13/14 1439    Visit Number 10   Number of Visits 24   Date for PT Re-Evaluation 05/20/14   Authorization Type Medicare   Authorization Time Period Gcode updated 10th visit 05/13/14   Authorization - Visit Number 10   Authorization - Number of Visits 20   PT Start Time 3295   PT Stop Time 1430   PT Time Calculation (min) 45 min      Past Medical History  Diagnosis Date  . Hypertension   . Hypercholesteremia   . Chronic back pain   . Depression   . Arthritis   . History of gout     Past Surgical History  Procedure Laterality Date  . Disk fusion      lumbar  . Knee surgery Right     Surgery on this knee twice.   . Carpal tunnel release Bilateral   . Foot arthrodesis Left 12/29/2013    Procedure: TRIPLE ARTHRODESIS FOOT;  Surgeon: Marcheta Grammes, DPM;  Location: [facility];  Service: Podiatry;  Laterality: Left;  . Achilles tendon surgery Left 12/29/2013    Procedure: PERCUTANEOUS TENDO-ACHILLES LENGTHENING;  Surgeon: Marcheta Grammes, DPM;  Location: [facility];  Service: Podiatry;  Laterality: Left;  . Calcaneal osteotomy with iliac crest bone graft and repair sublexing tendon and achilles tendon Left 12/29/2013    Procedure: CALCANEOCUBOID JOINT FUSION PERFORMED WITH BONE GRAFT;  Surgeon: Marcheta Grammes, DPM;  Location: [facility];  Service: Podiatry;  Laterality: Left;    There were no vitals taken for this visit.  Visit Diagnosis:  S/P ankle arthrodesis  Ankle weakness  Ankle stiffness, left  Difficulty walking      Subjective Assessment - 05/13/14 1352    Symptoms Patient notes discomfort with  medial-lateral foot movements. notes difficult performing multiplanar squatting, neutral plane squats do not increase discomfort.    Currently in Pain? Yes   Pain Score 4    Pain Location Ankle            [facility] Adult PT Treatment/Exercise - 05/13/14 0001    Ankle Exercises: Stretches   Gastroc Stretch Limitations 10x 3 seconds standing 3 way   Other Stretch knee drive for dorsiflexion 10x 3 directions on 8in box   Ankle Exercises: Standing   SLS Single elg toe touch 3D hip excursions (no sagittal plane) 10x each   Rebounder Side lunges on floor with opposite side rotation   Other Standing Ankle Exercises Squat matrix no toes out and no split stance, 5x each (30reps all together)   Other Standing Ankle Exercises 3D ankle excursion 10x           PT Short Term Goals - 05/13/14 1441    PT SHORT TERM GOAL #1   Title Patient will be able to dorsiflex foot to 10 degrees to decreaase early heel off during gait   Status On-going   PT SHORT TERM GOAL #2   Title Patient will be able to plantar flex foot >40 degrees bilaterally   Status On-going   PT SHORT TERM GOAL #3   Title Patient will be able to Stand both feet on the ground >  81minutes to cook BBQ.    Status On-going   PT SHORT TERM GOAL #4   Title Paqtient will be able to walk 15 minutes with CAM walker with pain <20 minutes.  performed in clinic   Status Achieved           PT Long Term Goals - 05/21/14 1441    PT LONG TERM GOAL #1   Title Patient will be able to dorsiflex foot to 15 degrees to decreaase early heel off during gait   Status On-going   PT LONG TERM GOAL #2   Title Patient will be able to plantar flex foot 50 degrees bilaterally   Status On-going   PT LONG TERM GOAL #3   Title Paqtient will be able to walk 30 minutes with CAM walker with pain <20 minutes with no AD.   Status On-going   PT LONG TERM GOAL #4   Title Paqtient will be able to walk 60 minutes without CAM walker and withtou AD 19minutes  without pain.    Status On-going   PT LONG TERM GOAL #5   Title Patient will be able to stand >60 minutes on uneven terrain to fish.    Time 12   Period Weeks               Plan - 05/21/2014 1439    Clinical Impression Statement Pain improved this session allowiong for increased weight bearing exercises. noted pain with eversion in weight bearing in lateral ankle secondary to compression of lateral peroneal meuscles. Pain n foot improved following ankle/foot soft tissue mobilization and joint mobilizations of toes and forefoot with patient stated "it doesnt feel likel i have rocks in my feet anymore.   PT Next Visit Plan Continue with current PT POC progress gait, ROM and strengthening in standing.  continue total gym heel toe raises and squats.          G-Codes - 2014/05/21 1440    Functional Assessment Tool Used Clinical judgement 45% limited   Functional Limitation Mobility: Walking and moving around   Mobility: Walking and Moving Around Current Status [phone]) At least 40 percent but less than 60 percent impaired, limited or restricted   Mobility: Walking and Moving Around Goal Status [phone]) At least 20 percent but less than 40 percent impaired, limited or restricted      Problem List There are no active problems to display for this patient.  Devona Konig PT DPT [facility] [address] [place], [place], [address] Phone: [phone]   Fax:  [phone]

## 2014-05-25 ENCOUNTER — Ambulatory Visit (HOSPITAL_COMMUNITY): Payer: Medicare Other | Admitting: Physical Therapy

## 2014-05-25 DIAGNOSIS — M25672 Stiffness of left ankle, not elsewhere classified: Secondary | ICD-10-CM

## 2014-05-25 DIAGNOSIS — Z981 Arthrodesis status: Secondary | ICD-10-CM

## 2014-05-25 DIAGNOSIS — R262 Difficulty in walking, not elsewhere classified: Secondary | ICD-10-CM

## 2014-05-25 DIAGNOSIS — R29898 Other symptoms and signs involving the musculoskeletal system: Secondary | ICD-10-CM

## 2014-05-25 NOTE — Therapy (Addendum)
[facility] [address] [place], [place], [address] Phone: [phone]   Fax:  [phone]  Physical Therapy Treatment  Patient Details  Name: Ethan Oliver MRN: [id number] Date of Birth: [birth year] Referring Provider:  Caprice Beaver, DPM  Encounter Date: 05/25/2014      PT End of Session - 05/25/14 1719    Visit Number 11   Number of Visits 24   Authorization Type Medicare   Authorization Time Period Gcode updated 10th visit 05/13/14   Authorization - Visit Number 11   Authorization - Number of Visits 20   PT Start Time 4765   PT Stop Time 1515   PT Time Calculation (min) 38 min   Activity Tolerance Patient tolerated treatment well;Patient limited by pain   Behavior During Therapy [facility] for tasks assessed/performed      Past Medical History  Diagnosis Date  . Hypertension   . Hypercholesteremia   . Chronic back pain   . Depression   . Arthritis   . History of gout     Past Surgical History  Procedure Laterality Date  . Disk fusion      lumbar  . Knee surgery Right     Surgery on this knee twice.   . Carpal tunnel release Bilateral   . Foot arthrodesis Left 12/29/2013    Procedure: TRIPLE ARTHRODESIS FOOT;  Surgeon: Marcheta Grammes, DPM;  Location: [facility];  Service: Podiatry;  Laterality: Left;  . Achilles tendon surgery Left 12/29/2013    Procedure: PERCUTANEOUS TENDO-ACHILLES LENGTHENING;  Surgeon: Marcheta Grammes, DPM;  Location: [facility];  Service: Podiatry;  Laterality: Left;  . Calcaneal osteotomy with iliac crest bone graft and repair sublexing tendon and achilles tendon Left 12/29/2013    Procedure: CALCANEOCUBOID JOINT FUSION PERFORMED WITH BONE GRAFT;  Surgeon: Marcheta Grammes, DPM;  Location: [facility];  Service: Podiatry;  Laterality: Left;    There were no vitals filed for this visit.  Visit Diagnosis:  S/P ankle arthrodesis  Ankle weakness  Ankle stiffness, left  Difficulty  walking      Subjective Assessment - 05/25/14 1450    Symptoms Patient states he has been feeling freally good and is ready for home discharge, MD also sent note stating patient ready for DC.    Currently in Pain? No/denies            [facility] PT Assessment - 05/25/14 0001    Assessment   Medical Diagnosis S/p triple arthrodesis Lt foot.    Onset Date 12/30/14   Next MD Visit B Caprice Beaver 05/24/2014   Prior Therapy no    AROM   Right Ankle Dorsiflexion 15   Right Ankle Plantar Flexion 52   Left Ankle Dorsiflexion 14   Left Ankle Plantar Flexion 42   Strength   Overall Strength Comments Rt 5/5 MMT   Left Knee Flexion 5/5   Left Knee Extension 5/5   Left Ankle Dorsiflexion 5/5   Left Ankle Plantar Flexion 2+/5                   [facility] Adult PT Treatment/Exercise - 05/25/14 0001    Ankle Exercises: Standing   SLS Single elg toe touch 3D hip excursions (no sagittal plane) 10x each   Other Standing Ankle Exercises Squat matrix (7 positions   Other Standing Ankle Exercises 3D ankle excursion 10x 3 way flamingo 10x each   Ankle Exercises: Stretches   Soleus Stretch 3 reps;30 seconds  Soleus Stretch Limitations supine with rope knee flexed   Gastroc Stretch 3 reps;30 seconds   Gastroc Stretch Limitations long sitting with rope   Other Stretch Piriformis stretch  performed by therapist 3x 30 seconds.    Other Stretch knee drive for dorsiflexion 10x 3 directions on 8in box                PT Education - 05-Jun-2014 1719    Education provided Yes   Education Details Exercise program for [employer]) Educated Patient   Methods Explanation;Demonstration;Handout   Comprehension Verbalized understanding;Returned demonstration          PT Short Term Goals - 2014/06/05 1724    PT SHORT TERM GOAL #1   Title Patient will be able to dorsiflex foot to 10 degrees to decreaase early heel off during gait   Status Achieved   PT SHORT TERM GOAL #2   Title Patient will  be able to plantar flex foot >40 degrees bilaterally   Status Achieved   PT SHORT TERM GOAL #3   Title Patient will be able to Stand both feet on the ground >50minutes to cook BBQ.    Status Achieved           PT Long Term Goals - 06/05/14 1724    PT LONG TERM GOAL #1   Title Patient will be able to dorsiflex foot to 15 degrees to decreaase early heel off during gait   Status On-going   PT LONG TERM GOAL #2   Title Patient will be able to plantar flex foot 50 degrees bilaterally   Status On-going   PT LONG TERM GOAL #3   Title Paqtient will be able to walk 30 minutes with CAM walker with pain <20 minutes with no AD.   Status Achieved   PT LONG TERM GOAL #4   Title Paqtient will be able to walk 60 minutes without CAM walker and withtou AD 76minutes without pain.    Status On-going   PT LONG TERM GOAL #5   Title Patient will be able to stand >60 minutes on uneven terrain to fish.    Status On-going   PT LONG TERM GOAL #6   Title Patient will dmeosntrate 5/5 ankle plantar flexion strength to be able to stand on toes to reach to a high shelf.    Status On-going   PT LONG TERM GOAL #7   Title Patient will demonstrate 5/5 ankle dorsiflexion strength to be able to decelerate foot flat durign gait.    Status Achieved               Plan - 06/05/2014 1720    Clinical Impression Statement Patient is being discharged per patient and physiciian request. patient displasy improved ROm and strength but still displays limited ankle ROm and strength. While patint would benefit from continued skilled PT as he was informed this session he is being discharged with a full HEP and eplanation of HEP progression.   PT Next Visit Plan Discharged with HEP          G-Codes - 05-Jun-2014 1727    Functional Assessment Tool Used Clinical judgement 20% limited   Mobility: Walking and Moving Around Goal Status [phone]) At least 20 percent but less than 40 percent impaired, limited or restricted     Goal:            G8979 CJ Discharge:   G8980 CJ  Problem List There are no active problems to  display for this patient.   Leia Alf 05/25/2014, 5:28 PM  [place], [place], [address] Phone: [phone]   Fax:  [phone]     PHYSICAL THERAPY DISCHARGE SUMMARY  Visits from Start of Care: 11  Current functional level related to goals / functional outcomes: See above   Remaining deficits: See above    Plan: Patient agrees to discharge.  Patient goals were partially met. Patient is being discharged due to being pleased with the current functional level.  ?????        Devona Konig PT DPT [phone]

## 2014-05-25 NOTE — Patient Instructions (Signed)
Ankle Plantar Flexion / Dorsiflexion, Standing   Stand while holding a stable object. Rise up on toes. Then rock back on heels. Hold each position 1 secondseconds. Repeat 10 times per session. Do 2 sessions per day.     Copyright  VHI. All rights reserved.

## 2014-06-01 ENCOUNTER — Encounter (HOSPITAL_COMMUNITY): Payer: Medicare Other | Admitting: Physical Therapy

## 2014-06-03 ENCOUNTER — Encounter (HOSPITAL_COMMUNITY): Payer: Medicare Other | Admitting: Physical Therapy

## 2014-06-06 ENCOUNTER — Encounter (HOSPITAL_COMMUNITY): Payer: Medicare Other

## 2014-06-08 ENCOUNTER — Encounter (HOSPITAL_COMMUNITY): Payer: Medicare Other | Admitting: Physical Therapy

## 2014-06-14 ENCOUNTER — Encounter (HOSPITAL_COMMUNITY): Payer: Medicare Other

## 2014-06-16 ENCOUNTER — Encounter (HOSPITAL_COMMUNITY): Payer: Medicare Other | Admitting: Physical Therapy

## 2015-02-21 ENCOUNTER — Ambulatory Visit (HOSPITAL_COMMUNITY)
Admission: RE | Admit: 2015-02-21 | Discharge: 2015-02-21 | Disposition: A | Discharge status: Home / Self Care | Payer: Medicare Other | Source: Ambulatory Visit | Attending: Podiatry | Admitting: Podiatry

## 2015-02-21 ENCOUNTER — Other Ambulatory Visit (HOSPITAL_COMMUNITY): Payer: Self-pay | Admitting: Podiatry

## 2015-02-21 DIAGNOSIS — M159 Polyosteoarthritis, unspecified: Secondary | ICD-10-CM

## 2015-07-19 ENCOUNTER — Emergency Department (HOSPITAL_COMMUNITY)
Admission: EM | Admit: 2015-07-19 | Discharge: 2015-07-20 | Disposition: A | Discharge status: Home / Self Care | Payer: Medicare Other | Attending: Emergency Medicine | Admitting: Emergency Medicine

## 2015-07-19 ENCOUNTER — Encounter (HOSPITAL_COMMUNITY): Payer: Self-pay | Admitting: Emergency Medicine

## 2015-07-19 DIAGNOSIS — F329 Major depressive disorder, single episode, unspecified: Secondary | ICD-10-CM | POA: Diagnosis not present

## 2015-07-19 DIAGNOSIS — N390 Urinary tract infection, site not specified: Secondary | ICD-10-CM | POA: Insufficient documentation

## 2015-07-19 DIAGNOSIS — M1990 Unspecified osteoarthritis, unspecified site: Secondary | ICD-10-CM | POA: Insufficient documentation

## 2015-07-19 DIAGNOSIS — I10 Essential (primary) hypertension: Secondary | ICD-10-CM | POA: Insufficient documentation

## 2015-07-19 DIAGNOSIS — R509 Fever, unspecified: Secondary | ICD-10-CM | POA: Diagnosis present

## 2015-07-19 LAB — CBC WITH DIFFERENTIAL/PLATELET
BASOS ABS: 0 10*3/uL (ref 0.0–0.1)
BASOS PCT: 0 %
EOS ABS: 0 10*3/uL (ref 0.0–0.7)
EOS PCT: 0 %
HCT: 48 % (ref 39.0–52.0)
Hemoglobin: 16.1 g/dL (ref 13.0–17.0)
Lymphocytes Relative: 9 %
Lymphs Abs: 1.3 10*3/uL (ref 0.7–4.0)
MCH: 29.9 pg (ref 26.0–34.0)
MCHC: 33.5 g/dL (ref 30.0–36.0)
MCV: 89.1 fL (ref 78.0–100.0)
Monocytes Absolute: 1.3 10*3/uL — ABNORMAL HIGH (ref 0.1–1.0)
Monocytes Relative: 8 %
Neutro Abs: 12.3 10*3/uL — ABNORMAL HIGH (ref 1.7–7.7)
Neutrophils Relative %: 83 %
PLATELETS: 175 10*3/uL (ref 150–400)
RBC: 5.39 MIL/uL (ref 4.22–5.81)
RDW: 13.4 % (ref 11.5–15.5)
WBC: 14.9 10*3/uL — AB (ref 4.0–10.5)

## 2015-07-19 LAB — URINALYSIS, ROUTINE W REFLEX MICROSCOPIC
Bilirubin Urine: NEGATIVE
Glucose, UA: NEGATIVE mg/dL
KETONES UR: NEGATIVE mg/dL
NITRITE: NEGATIVE
PH: 7 (ref 5.0–8.0)
SPECIFIC GRAVITY, URINE: 1.015 (ref 1.005–1.030)

## 2015-07-19 LAB — BASIC METABOLIC PANEL
ANION GAP: 10 (ref 5–15)
BUN: 14 mg/dL (ref 6–20)
CALCIUM: 9.3 mg/dL (ref 8.9–10.3)
CHLORIDE: 99 mmol/L — AB (ref 101–111)
CO2: 23 mmol/L (ref 22–32)
CREATININE: 0.87 mg/dL (ref 0.61–1.24)
GFR calc non Af Amer: >60 mL/min (ref >60)
Glucose, Bld: 162 mg/dL — ABNORMAL HIGH (ref 65–99)
Potassium: 4 mmol/L (ref 3.5–5.1)
Sodium: 132 mmol/L — ABNORMAL LOW (ref 135–145)

## 2015-07-19 LAB — URINE MICROSCOPIC-ADD ON

## 2015-07-19 LAB — LACTIC ACID, PLASMA: LACTIC ACID, VENOUS: 0.9 mmol/L (ref 0.5–2.0)

## 2015-07-19 MED ORDER — ONDANSETRON 4 MG PO TBDP: 4.0000 mg | ORAL_TABLET | Freq: Three times a day (TID) | ORAL | Status: DC | PRN

## 2015-07-19 MED ORDER — CEPHALEXIN 500 MG PO CAPS: 500.0000 mg | ORAL_CAPSULE | Freq: Four times a day (QID) | ORAL | Status: DC

## 2015-07-19 MED ORDER — IBUPROFEN 800 MG PO TABS
800.0000 mg | ORAL_TABLET | Freq: Once | ORAL | Status: AC
  Administered 2015-07-20: 800 mg via ORAL
  Filled 2015-07-19: qty 1

## 2015-07-19 MED ORDER — ONDANSETRON 4 MG PO TBDP
4.0000 mg | ORAL_TABLET | Freq: Once | ORAL | Status: AC
  Administered 2015-07-20: 4 mg via ORAL
  Filled 2015-07-19: qty 1

## 2015-07-19 MED ORDER — DEXTROSE 5 % IV SOLN: 1.0000 g | Freq: Once | INTRAVENOUS | Status: DC

## 2015-07-19 NOTE — ED Provider Notes (Signed)
By signing my name below, I, Randa Evens, attest that this documentation has been prepared under the direction and in the presence of [employer], DO. Electronically Signed: Randa Evens, ED Scribe. 07/19/2015. 11:41 PM.  TIME SEEN: 11:41 PM  CHIEF COMPLAINT: Fever  HPI:HPI Comments: Ethan Oliver is a 60 y.o. male with history of hypertension, hyperlipidemia who presents to the Emergency Department complaining of fever (max temp 102) onset 1 days prior. Pt reports associated urinary frequency, nausea, myalgias and HA. Pt denies any medications PTA. Pt reports some relief when laying down. Pt denies vomiting, diarrhea, cough, sore throat, rash, penile discharge or testicle swelling. Hx of kidney stones 7 years prior. Pt report HX of UTI and that his symptoms feel similar to previous UTI.   ROS: See HPI Constitutional:  fever  Eyes: no drainage  ENT: no runny nose   Cardiovascular:  no chest pain  Resp: no SOB  GI: no vomiting GU: no dysuria Integumentary: no rash  Allergy: no hives  Musculoskeletal: no leg swelling  Neurological: no slurred speech ROS otherwise negative  PAST MEDICAL HISTORY/PAST SURGICAL HISTORY:  Past Medical History  Diagnosis Date  . Hypertension   . Hypercholesteremia   . Chronic back pain   . Depression   . Arthritis   . History of gout     MEDICATIONS:  Prior to Admission medications   Medication Sig Start Date End Date Taking? Authorizing Provider  citalopram (CELEXA) 20 MG tablet Take 20 mg by mouth daily.    Historical Provider, MD  lisinopril (PRINIVIL,ZESTRIL) 20 MG tablet Take 20 mg by mouth daily.    Historical Provider, MD  meloxicam (MOBIC) 15 MG tablet Take 15 mg by mouth daily.    Historical Provider, MD  naproxen sodium (ALEVE) 220 MG tablet Take 220 mg by mouth 2 (two) times daily with a meal.    Historical Provider, MD  oxyCODONE (OXYCONTIN) 10 MG 12 hr tablet Take 5-10 mg by mouth 2 (two) times daily as needed (severe pain).      Historical Provider, MD  pravastatin (PRAVACHOL) 40 MG tablet Take 40 mg by mouth daily.    Historical Provider, MD    ALLERGIES:  Allergies  Allergen Reactions  . Codeine Nausea Only    SOCIAL HISTORY:  Social History  Substance Use Topics  . Smoking status: Never Smoker   . Smokeless tobacco: Former Systems developer    Types: Chew    Quit date: 09/21/2013  . Alcohol Use: No    FAMILY HISTORY: No family history on file.  EXAM: BP 154/94 mmHg  Pulse 106  Temp(Src) 100.6 F (38.1 C) (Oral)  Resp 20  Ht 5\' 11"  (1.803 m)  Wt 308 lb (139.708 kg)  BMI 42.98 kg/m2  SpO2 96%   CONSTITUTIONAL: Alert and oriented and responds appropriately to questions. Well-appearing; well-nourished, Febrile but nontoxic appearing HEAD: Normocephalic EYES: Conjunctivae clear, PERRL ENT: normal nose; no rhinorrhea; moist mucous membranes; No pharyngeal erythema or petechiae, no tonsillar hypertrophy or exudate, no uvular deviation, no trismus or drooling, normal phonation, no stridor, no dental caries or abscess noted, no Ludwig's angina, tongue sits flat in the bottom of the mouth NECK: Supple, no meningismus, no LAD  CARD: RRR; S1 and S2 appreciated; no murmurs, no clicks, no rubs, no gallops RESP: Normal chest excursion without splinting or tachypnea; breath sounds clear and equal bilaterally; no wheezes, no rhonchi, no rales, no hypoxia or respiratory distress, speaking full sentences ABD/GI: Normal bowel sounds; non-distended;  soft, non-tender, no rebound, no guarding, no peritoneal signs BACK:  The back appears normal and is non-tender to palpation, there is no CVA tenderness EXT: Normal ROM in all joints; non-tender to palpation; no edema; normal capillary refill; no cyanosis, no calf tenderness or swelling    SKIN: Normal color for age and race; warm; no rash NEURO: Moves all extremities equally, sensation to light touch intact diffusely, cranial nerves II through XII intact PSYCH: The patient's  mood and manner are appropriate. Grooming and personal hygiene are appropriate.  MEDICAL DECISION MAKING: Patient here with fever and complains of symptoms are similar to his prior UTI. Reports this feels exactly the same. He does have a headache on exam but no meningismus. Abdominal exam is benign. He denies any back pain to me currently. Doubt kidney stone given the back pain. Is no CVA tenderness. Labs show mild leukocytosis with left shift. He does appear to have urinary tract infection. Creatinine is normal. Lactate also normal. We'll give him a dose of IM ceftriaxone the emergency department and discharge on Keflex. No previous urine culture and our system. He is not sure what antibiotic has helped him in the past with his UTI. I have recommended alternating Tylenol and ibuprofen as needed for fever and pain. He has a PCP for follow-up. Discussed return precautions.    At this time, I do not feel there is any life-threatening condition present. I have reviewed and discussed all results (EKG, imaging, lab, urine as appropriate), exam findings with patient. I have reviewed nursing notes and appropriate previous records.  I feel the patient is safe to be discharged home without further emergent workup. Discussed usual and customary return precautions. Patient and family (if present) verbalize understanding and are comfortable with this plan.  Patient will follow-up with their primary care provider. If they do not have a primary care provider, information for follow-up has been provided to them. All questions have been answered.   I personally performed the services described in this documentation, which was scribed in my presence. The recorded information has been reviewed and is accurate.   [place], DO 07/20/15 [phone]

## 2015-07-19 NOTE — ED Notes (Signed)
Pt states fever times 2 days, frequent voiding, back and leg pain

## 2015-07-19 NOTE — Discharge Instructions (Signed)
You may alternate Tylenol 1000 mg every 6 hours as needed for fever and pain and Ibuprofen 800 mg every 8 hours as needed for fever and pain.  Please increase your water intake, rest.  Please follow up with your PCP in one week to re-check you.  Urinary Tract Infection Urinary tract infections (UTIs) can develop anywhere along your urinary tract. Your urinary tract is your body's drainage system for removing wastes and extra water. Your urinary tract includes two kidneys, two ureters, a bladder, and a urethra. Your kidneys are a pair of bean-shaped organs. Each kidney is about the size of your fist. They are located below your ribs, one on each side of your spine. CAUSES Infections are caused by microbes, which are microscopic organisms, including fungi, viruses, and bacteria. These organisms are so small that they can only be seen through a microscope. Bacteria are the microbes that most commonly cause UTIs. SYMPTOMS  Symptoms of UTIs may vary by age and gender of the patient and by the location of the infection. Symptoms in young women typically include a frequent and intense urge to urinate and a painful, burning feeling in the bladder or urethra during urination. Older women and men are more likely to be tired, shaky, and weak and have muscle aches and abdominal pain. A fever may mean the infection is in your kidneys. Other symptoms of a kidney infection include pain in your back or sides below the ribs, nausea, and vomiting. DIAGNOSIS To diagnose a UTI, your caregiver will ask you about your symptoms. Your caregiver will also ask you to provide a urine sample. The urine sample will be tested for bacteria and white blood cells. White blood cells are made by your body to help fight infection. TREATMENT  Typically, UTIs can be treated with medication. Because most UTIs are caused by a bacterial infection, they usually can be treated with the use of antibiotics. The choice of antibiotic and length of  treatment depend on your symptoms and the type of bacteria causing your infection. HOME CARE INSTRUCTIONS  If you were prescribed antibiotics, take them exactly as your caregiver instructs you. Finish the medication even if you feel better after you have only taken some of the medication.  Drink enough water and fluids to keep your urine clear or pale yellow.  Avoid caffeine, tea, and carbonated beverages. They tend to irritate your bladder.  Empty your bladder often. Avoid holding urine for long periods of time.  Empty your bladder before and after sexual intercourse.  After a bowel movement, women should cleanse from front to back. Use each tissue only once. SEEK MEDICAL CARE IF:   You have back pain.  You develop a fever.  Your symptoms do not begin to resolve within 3 days. SEEK IMMEDIATE MEDICAL CARE IF:   You have severe back pain or lower abdominal pain.  You develop chills.  You have nausea or vomiting.  You have continued burning or discomfort with urination. MAKE SURE YOU:   Understand these instructions.  Will watch your condition.  Will get help right away if you are not doing well or get worse.   This information is not intended to replace advice given to you by your health care provider. Make sure you discuss any questions you have with your health care provider.   Document Released: 12/05/2004 Document Revised: 11/16/2014 Document Reviewed: 04/05/2011 Elsevier Interactive Patient Education [employer].

## 2015-07-20 MED ORDER — LIDOCAINE HCL (PF) 1 % IJ SOLN
INTRAMUSCULAR | Status: AC
  Administered 2015-07-20: 5 mL
  Filled 2015-07-20: qty 5

## 2015-07-20 MED ORDER — CEFTRIAXONE SODIUM 1 G IJ SOLR
1.0000 g | Freq: Once | INTRAMUSCULAR | Status: AC
  Administered 2015-07-20: 1 g via INTRAMUSCULAR
  Filled 2015-07-20: qty 10

## 2015-07-22 LAB — URINE CULTURE

## 2015-07-23 ENCOUNTER — Telehealth (HOSPITAL_BASED_OUTPATIENT_CLINIC_OR_DEPARTMENT_OTHER): Payer: Self-pay

## 2015-07-23 NOTE — Telephone Encounter (Signed)
Post ED Visit - Positive Culture Follow-up  Culture report reviewed by antimicrobial stewardship pharmacist:  [x]  Elenor Quinones, Pharm.D. []  Heide Guile, Pharm.D., BCPS []  Parks Neptune, Pharm.D. []  Alycia Rossetti, Pharm.D., BCPS []  [place], Pharm.D., BCPS, AAHIVP []  Legrand Como, Pharm.D., BCPS, AAHIVP []  Milus Glazier, Pharm.D. []  Stephens November, [place].D.  Positive urine culture Treated with Cephalexin, organism sensitive to the same and no further patient follow-up is required at this time.  Genia Del 07/23/2015, 12:33 PM

## 2016-04-02 DIAGNOSIS — M960 Pseudarthrosis after fusion or arthrodesis: Secondary | ICD-10-CM | POA: Diagnosis not present

## 2016-04-02 DIAGNOSIS — Z967 Presence of other bone and tendon implants: Secondary | ICD-10-CM | POA: Diagnosis not present

## 2016-04-02 DIAGNOSIS — Z981 Arthrodesis status: Secondary | ICD-10-CM | POA: Diagnosis not present

## 2016-04-16 ENCOUNTER — Encounter: Payer: Self-pay | Admitting: Internal Medicine

## 2016-04-26 DIAGNOSIS — I10 Essential (primary) hypertension: Secondary | ICD-10-CM | POA: Diagnosis not present

## 2016-04-26 DIAGNOSIS — E119 Type 2 diabetes mellitus without complications: Secondary | ICD-10-CM | POA: Diagnosis not present

## 2016-05-17 ENCOUNTER — Other Ambulatory Visit (HOSPITAL_COMMUNITY): Payer: Self-pay | Admitting: Podiatry

## 2016-05-17 ENCOUNTER — Ambulatory Visit (HOSPITAL_COMMUNITY)
Admission: RE | Admit: 2016-05-17 | Discharge: 2016-05-17 | Disposition: A | Discharge status: Home / Self Care | Payer: PPO | Source: Ambulatory Visit | Attending: Podiatry | Admitting: Podiatry

## 2016-05-17 DIAGNOSIS — M76821 Posterior tibial tendinitis, right leg: Secondary | ICD-10-CM

## 2016-05-17 DIAGNOSIS — M25571 Pain in right ankle and joints of right foot: Secondary | ICD-10-CM | POA: Diagnosis not present

## 2016-05-17 DIAGNOSIS — M19071 Primary osteoarthritis, right ankle and foot: Secondary | ICD-10-CM | POA: Insufficient documentation

## 2016-05-17 DIAGNOSIS — M7731 Calcaneal spur, right foot: Secondary | ICD-10-CM | POA: Insufficient documentation

## 2016-05-17 DIAGNOSIS — M79671 Pain in right foot: Secondary | ICD-10-CM | POA: Diagnosis not present

## 2016-08-21 DIAGNOSIS — Z6841 Body Mass Index (BMI) 40.0 and over, adult: Secondary | ICD-10-CM | POA: Diagnosis not present

## 2016-08-21 DIAGNOSIS — M5416 Radiculopathy, lumbar region: Secondary | ICD-10-CM | POA: Diagnosis not present

## 2016-08-21 DIAGNOSIS — M5489 Other dorsalgia: Secondary | ICD-10-CM | POA: Diagnosis not present

## 2016-08-21 DIAGNOSIS — M545 Low back pain: Secondary | ICD-10-CM | POA: Diagnosis not present

## 2016-09-03 DIAGNOSIS — M79671 Pain in right foot: Secondary | ICD-10-CM | POA: Diagnosis not present

## 2016-09-03 DIAGNOSIS — M76821 Posterior tibial tendinitis, right leg: Secondary | ICD-10-CM | POA: Diagnosis not present

## 2016-09-03 DIAGNOSIS — M25571 Pain in right ankle and joints of right foot: Secondary | ICD-10-CM | POA: Diagnosis not present

## 2016-11-07 DIAGNOSIS — M159 Polyosteoarthritis, unspecified: Secondary | ICD-10-CM | POA: Insufficient documentation

## 2016-11-07 DIAGNOSIS — E119 Type 2 diabetes mellitus without complications: Secondary | ICD-10-CM | POA: Insufficient documentation

## 2016-11-07 DIAGNOSIS — M6208 Separation of muscle (nontraumatic), other site: Secondary | ICD-10-CM | POA: Insufficient documentation

## 2016-11-07 DIAGNOSIS — E7800 Pure hypercholesterolemia, unspecified: Secondary | ICD-10-CM | POA: Insufficient documentation

## 2016-11-07 DIAGNOSIS — I10 Essential (primary) hypertension: Secondary | ICD-10-CM | POA: Insufficient documentation

## 2016-11-13 DIAGNOSIS — E7800 Pure hypercholesterolemia, unspecified: Secondary | ICD-10-CM | POA: Diagnosis not present

## 2016-11-13 DIAGNOSIS — I10 Essential (primary) hypertension: Secondary | ICD-10-CM | POA: Diagnosis not present

## 2016-11-13 DIAGNOSIS — E119 Type 2 diabetes mellitus without complications: Secondary | ICD-10-CM | POA: Diagnosis not present

## 2016-11-13 DIAGNOSIS — M961 Postlaminectomy syndrome, not elsewhere classified: Secondary | ICD-10-CM | POA: Diagnosis not present

## 2016-11-13 DIAGNOSIS — M159 Polyosteoarthritis, unspecified: Secondary | ICD-10-CM | POA: Diagnosis not present

## 2017-02-10 DIAGNOSIS — M549 Dorsalgia, unspecified: Secondary | ICD-10-CM | POA: Diagnosis not present

## 2017-03-18 DIAGNOSIS — E119 Type 2 diabetes mellitus without complications: Secondary | ICD-10-CM | POA: Diagnosis not present

## 2017-03-18 DIAGNOSIS — M545 Low back pain: Secondary | ICD-10-CM | POA: Diagnosis not present

## 2017-03-18 DIAGNOSIS — I10 Essential (primary) hypertension: Secondary | ICD-10-CM | POA: Diagnosis not present

## 2017-04-30 DIAGNOSIS — M545 Low back pain: Secondary | ICD-10-CM | POA: Diagnosis not present

## 2017-04-30 DIAGNOSIS — Z6841 Body Mass Index (BMI) 40.0 and over, adult: Secondary | ICD-10-CM | POA: Diagnosis not present

## 2017-04-30 DIAGNOSIS — R030 Elevated blood-pressure reading, without diagnosis of hypertension: Secondary | ICD-10-CM | POA: Diagnosis not present

## 2017-04-30 DIAGNOSIS — M5416 Radiculopathy, lumbar region: Secondary | ICD-10-CM | POA: Diagnosis not present

## 2017-07-08 DIAGNOSIS — M545 Low back pain: Secondary | ICD-10-CM | POA: Diagnosis not present

## 2017-07-08 DIAGNOSIS — E119 Type 2 diabetes mellitus without complications: Secondary | ICD-10-CM | POA: Diagnosis not present

## 2017-07-08 DIAGNOSIS — I10 Essential (primary) hypertension: Secondary | ICD-10-CM | POA: Diagnosis not present

## 2017-09-09 DIAGNOSIS — M79671 Pain in right foot: Secondary | ICD-10-CM | POA: Diagnosis not present

## 2017-09-09 DIAGNOSIS — M25571 Pain in right ankle and joints of right foot: Secondary | ICD-10-CM | POA: Diagnosis not present

## 2017-12-09 DIAGNOSIS — M79671 Pain in right foot: Secondary | ICD-10-CM | POA: Diagnosis not present

## 2017-12-09 DIAGNOSIS — M2141 Flat foot [pes planus] (acquired), right foot: Secondary | ICD-10-CM | POA: Diagnosis not present

## 2017-12-09 DIAGNOSIS — M76821 Posterior tibial tendinitis, right leg: Secondary | ICD-10-CM | POA: Diagnosis not present

## 2017-12-09 DIAGNOSIS — M25571 Pain in right ankle and joints of right foot: Secondary | ICD-10-CM | POA: Diagnosis not present

## 2017-12-17 DIAGNOSIS — M5416 Radiculopathy, lumbar region: Secondary | ICD-10-CM | POA: Diagnosis not present

## 2017-12-17 DIAGNOSIS — M5489 Other dorsalgia: Secondary | ICD-10-CM | POA: Diagnosis not present

## 2017-12-17 DIAGNOSIS — Z6841 Body Mass Index (BMI) 40.0 and over, adult: Secondary | ICD-10-CM | POA: Diagnosis not present

## 2017-12-17 DIAGNOSIS — M545 Low back pain: Secondary | ICD-10-CM | POA: Diagnosis not present

## 2017-12-18 DIAGNOSIS — M159 Polyosteoarthritis, unspecified: Secondary | ICD-10-CM | POA: Diagnosis not present

## 2017-12-18 DIAGNOSIS — I10 Essential (primary) hypertension: Secondary | ICD-10-CM | POA: Diagnosis not present

## 2017-12-18 DIAGNOSIS — E7800 Pure hypercholesterolemia, unspecified: Secondary | ICD-10-CM | POA: Diagnosis not present

## 2017-12-18 DIAGNOSIS — E119 Type 2 diabetes mellitus without complications: Secondary | ICD-10-CM | POA: Diagnosis not present

## 2017-12-18 DIAGNOSIS — F39 Unspecified mood [affective] disorder: Secondary | ICD-10-CM | POA: Diagnosis not present

## 2017-12-18 DIAGNOSIS — Z1159 Encounter for screening for other viral diseases: Secondary | ICD-10-CM | POA: Diagnosis not present

## 2017-12-18 DIAGNOSIS — Z0000 Encounter for general adult medical examination without abnormal findings: Secondary | ICD-10-CM | POA: Diagnosis not present

## 2017-12-18 DIAGNOSIS — Z130 Encounter for screening for diseases of the blood and blood-forming organs and certain disorders involving the immune mechanism: Secondary | ICD-10-CM | POA: Diagnosis not present

## 2018-01-14 ENCOUNTER — Other Ambulatory Visit: Payer: Self-pay | Admitting: Podiatry

## 2018-02-09 NOTE — Patient Instructions (Signed)
Ethan Oliver  02/09/2018     @PREFPERIOPPHARMACY @   Your procedure is scheduled on  02/18/2018 .  Report to [facility] at  700   A.M.  Call this number if you have problems the morning of surgery:  [phone]   Remember:  Do not eat or drink after midnight.                     Take these medicines the morning of surgery with A SIP OF WATER  Celexa, lisinopril, mobic ( if needed), zofran ( if needed), oxycodone ( if needed).    Do not wear jewelry, make-up or nail polish.  Do not wear lotions, powders, or perfumes, or deodorant.  Do not shave 48 hours prior to surgery.  Men may shave face and neck.  Do not bring valuables to the hospital.  [facility] is not responsible for any belongings or valuables.  Contacts, dentures or bridgework may not be worn into surgery.  Leave your suitcase in the car.  After surgery it may be brought to your room.  For patients admitted to the hospital, discharge time will be determined by your treatment team.  Patients discharged the day of surgery will not be allowed to drive home.   Name and phone number of your driver:   family Special instructions:  None  Please read over the following fact sheets that you were given. Anesthesia Post-op Instructions and Care and Recovery After Surgery       Incision Care, Adult An incision is a cut that a doctor makes in your skin for surgery (for a procedure). Most times, these cuts are closed after surgery. Your cut from surgery may be closed with stitches (sutures), staples, skin glue, or skin tape (adhesive strips). You may need to return to your doctor to have stitches or staples taken out. This may happen many days or many weeks after your surgery. The cut needs to be well cared for so it does not get infected. How to care for your cut Cut care  Follow instructions from your doctor about how to take care of your cut. Make sure you: ? Wash your hands with soap and water before you  change your bandage (dressing). If you cannot use soap and water, use hand sanitizer. ? Change your bandage as told by your doctor. ? Leave stitches, skin glue, or skin tape in place. They may need to stay in place for 2 weeks or longer. If tape strips get loose and curl up, you may trim the loose edges. Do not remove tape strips completely unless your doctor says it is okay.  Check your cut area every day for signs of infection. Check for: ? More redness, swelling, or pain. ? More fluid or blood. ? Warmth. ? Pus or a bad smell.  Ask your doctor how to clean the cut. This may include: ? Using mild soap and water. ? Using a clean towel to pat the cut dry after you clean it. ? Putting a cream or ointment on the cut. Do this only as told by your doctor. ? Covering the cut with a clean bandage.  Ask your doctor when you can leave the cut uncovered.  Do not take baths, swim, or use a hot tub until your doctor says it is okay. Ask your doctor if you can take showers. You may only be allowed to take sponge baths for bathing. Medicines  If you were prescribed an antibiotic medicine, cream, or ointment, take the antibiotic or put it on the cut as told by your doctor. Do not stop taking or putting on the antibiotic even if your condition gets better.  Take over-the-counter and prescription medicines only as told by your doctor. General instructions  Limit movement around your cut. This helps healing. ? Avoid straining, lifting, or exercise for the first month, or for as long as told by your doctor. ? Follow instructions from your doctor about going back to your normal activities. ? Ask your doctor what activities are safe.  Protect your cut from the sun when you are outside for the first 6 months, or for as long as told by your doctor. Put on sunscreen around the scar or cover up the scar.  Keep all follow-up visits as told by your doctor. This is important. Contact a doctor if:  Your have  more redness, swelling, or pain around the cut.  You have more fluid or blood coming from the cut.  Your cut feels warm to the touch.  You have pus or a bad smell coming from the cut.  You have a fever or shaking chills.  You feel sick to your stomach (nauseous) or you throw up (vomit).  You are dizzy.  Your stitches or staples come undone. Get help right away if:  You have a red streak coming from your cut.  Your cut bleeds through the bandage and the bleeding does not stop with gentle pressure.  The edges of your cut open up and separate.  You have very bad (severe) pain.  You have a rash.  You are confused.  You pass out (faint).  You have trouble breathing and you have a fast heartbeat. This information is not intended to replace advice given to you by your health care provider. Make sure you discuss any questions you have with your health care provider. Document Released: 05/20/2011 Document Revised: 11/03/2015 Document Reviewed: 11/03/2015 Elsevier Interactive Patient Education  2017 [place] Anesthesia is a term that refers to techniques, procedures, and medicines that help a person stay safe and comfortable during a medical procedure. Monitored anesthesia care, or sedation, is one type of anesthesia. Your anesthesia specialist may recommend sedation if you will be having a procedure that does not require you to be unconscious, such as:  Cataract surgery.  A dental procedure.  A biopsy.  A colonoscopy.  During the procedure, you may receive a medicine to help you relax (sedative). There are three levels of sedation:  Mild sedation. At this level, you may feel awake and relaxed. You will be able to follow directions.  Moderate sedation. At this level, you will be sleepy. You may not remember the procedure.  Deep sedation. At this level, you will be asleep. You will not remember the procedure.  The more medicine you are  given, the deeper your level of sedation will be. Depending on how you respond to the procedure, the anesthesia specialist may change your level of sedation or the type of anesthesia to fit your needs. An anesthesia specialist will monitor you closely during the procedure. Let your health care provider know about:  Any allergies you have.  All medicines you are taking, including vitamins, herbs, eye drops, creams, and over-the-counter medicines.  Any use of steroids (by mouth or as a cream).  Any problems you or family members have had with sedatives and anesthetic medicines.  Any blood  disorders you have.  Any surgeries you have had.  Any medical conditions you have, such as sleep apnea.  Whether you are pregnant or may be pregnant.  Any use of cigarettes, alcohol, or street drugs. What are the risks? Generally, this is a safe procedure. However, problems may occur, including:  Getting too much medicine (oversedation).  Nausea.  Allergic reaction to medicines.  Trouble breathing. If this happens, a breathing tube may be used to help with breathing. It will be removed when you are awake and breathing on your own.  Heart trouble.  Lung trouble.  Before the procedure Staying hydrated Follow instructions from your health care provider about hydration, which may include:  Up to 2 hours before the procedure - you may continue to drink clear liquids, such as water, clear fruit juice, black coffee, and plain tea.  Eating and drinking restrictions Follow instructions from your health care provider about eating and drinking, which may include:  8 hours before the procedure - stop eating heavy meals or foods such as meat, fried foods, or fatty foods.  6 hours before the procedure - stop eating light meals or foods, such as toast or cereal.  6 hours before the procedure - stop drinking milk or drinks that contain milk.  2 hours before the procedure - stop drinking clear  liquids.  Medicines Ask your health care provider about:  Changing or stopping your regular medicines. This is especially important if you are taking diabetes medicines or blood thinners.  Taking medicines such as aspirin and ibuprofen. These medicines can thin your blood. Do not take these medicines before your procedure if your health care provider instructs you not to.  Tests and exams  You will have a physical exam.  You may have blood tests done to show: ? How well your kidneys and liver are working. ? How well your blood can clot.  General instructions  Plan to have someone take you home from the hospital or clinic.  If you will be going home right after the procedure, plan to have someone with you for 24 hours.  What happens during the procedure?  Your blood pressure, heart rate, breathing, level of pain and overall condition will be monitored.  An IV tube will be inserted into one of your veins.  Your anesthesia specialist will give you medicines as needed to keep you comfortable during the procedure. This may mean changing the level of sedation.  The procedure will be performed. After the procedure  Your blood pressure, heart rate, breathing rate, and blood oxygen level will be monitored until the medicines you were given have worn off.  Do not drive for 24 hours if you received a sedative.  You may: ? Feel sleepy, clumsy, or nauseous. ? Feel forgetful about what happened after the procedure. ? Have a sore throat if you had a breathing tube during the procedure. ? Vomit. This information is not intended to replace advice given to you by your health care provider. Make sure you discuss any questions you have with your health care provider. Document Released: 11/21/2004 Document Revised: 08/04/2015 Document Reviewed: 06/18/2015 Elsevier Interactive Patient Education  2018 [place], Care After These instructions provide you with  information about caring for yourself after your procedure. Your health care provider may also give you more specific instructions. Your treatment has been planned according to current medical practices, but problems sometimes occur. Call your health care provider if you have any problems or  questions after your procedure. What can I expect after the procedure? After your procedure, it is common to:  Feel sleepy for several hours.  Feel clumsy and have poor balance for several hours.  Feel forgetful about what happened after the procedure.  Have poor judgment for several hours.  Feel nauseous or vomit.  Have a sore throat if you had a breathing tube during the procedure.  Follow these instructions at home: For at least 24 hours after the procedure:   Do not: ? Participate in activities in which you could fall or become injured. ? Drive. ? Use heavy machinery. ? Drink alcohol. ? Take sleeping pills or medicines that cause drowsiness. ? Make important decisions or sign legal documents. ? Take care of children on your own.  Rest. Eating and drinking  Follow the diet that is recommended by your health care provider.  If you vomit, drink water, juice, or soup when you can drink without vomiting.  Make sure you have little or no nausea before eating solid foods. General instructions  Have a responsible adult stay with you until you are awake and alert.  Take over-the-counter and prescription medicines only as told by your health care provider.  If you smoke, do not smoke without supervision.  Keep all follow-up visits as told by your health care provider. This is important. Contact a health care provider if:  You keep feeling nauseous or you keep vomiting.  You feel light-headed.  You develop a rash.  You have a fever. Get help right away if:  You have trouble breathing. This information is not intended to replace advice given to you by your health care provider.  Make sure you discuss any questions you have with your health care provider. Document Released: 06/18/2015 Document Revised: 10/18/2015 Document Reviewed: 06/18/2015 Elsevier Interactive Patient Education  [employer].

## 2018-02-13 ENCOUNTER — Other Ambulatory Visit: Payer: Self-pay

## 2018-02-13 ENCOUNTER — Encounter (HOSPITAL_COMMUNITY)
Admission: RE | Admit: 2018-02-13 | Discharge: 2018-02-13 | Disposition: A | Discharge status: Home / Self Care | Payer: PPO | Source: Ambulatory Visit | Attending: Podiatry | Admitting: Podiatry

## 2018-02-13 ENCOUNTER — Encounter (HOSPITAL_COMMUNITY): Payer: Self-pay

## 2018-02-13 ENCOUNTER — Ambulatory Visit (HOSPITAL_COMMUNITY)
Admission: RE | Admit: 2018-02-13 | Discharge: 2018-02-13 | Disposition: A | Discharge status: Home / Self Care | Payer: PPO | Source: Ambulatory Visit | Attending: Podiatry | Admitting: Podiatry

## 2018-02-13 DIAGNOSIS — M76821 Posterior tibial tendinitis, right leg: Secondary | ICD-10-CM | POA: Diagnosis not present

## 2018-02-13 DIAGNOSIS — M19071 Primary osteoarthritis, right ankle and foot: Secondary | ICD-10-CM | POA: Diagnosis not present

## 2018-02-13 DIAGNOSIS — M216X1 Other acquired deformities of right foot: Secondary | ICD-10-CM | POA: Diagnosis not present

## 2018-02-13 DIAGNOSIS — M2141 Flat foot [pes planus] (acquired), right foot: Secondary | ICD-10-CM

## 2018-02-13 HISTORY — DX: Personal history of urinary calculi: Z87.442

## 2018-02-13 LAB — BASIC METABOLIC PANEL
Anion gap: 9 (ref 5–15)
BUN: 17 mg/dL (ref 6–20)
CO2: 25 mmol/L (ref 22–32)
CREATININE: 0.82 mg/dL (ref 0.61–1.24)
Calcium: 9.1 mg/dL (ref 8.9–10.3)
Chloride: 102 mmol/L (ref 98–111)
GFR calc Af Amer: >60 mL/min (ref >60)
GFR calc non Af Amer: >60 mL/min (ref >60)
GLUCOSE: 195 mg/dL — AB (ref 70–99)
Potassium: 3.9 mmol/L (ref 3.5–5.1)
SODIUM: 136 mmol/L (ref 135–145)

## 2018-02-13 LAB — CBC WITH DIFFERENTIAL/PLATELET
ABS IMMATURE GRANULOCYTES: 0.02 10*3/uL (ref 0.00–0.07)
Basophils Absolute: 0.1 10*3/uL (ref 0.0–0.1)
Basophils Relative: 1 %
EOS ABS: 0.3 10*3/uL (ref 0.0–0.5)
Eosinophils Relative: 5 %
HCT: 46.8 % (ref 39.0–52.0)
Hemoglobin: 15.1 g/dL (ref 13.0–17.0)
IMMATURE GRANULOCYTES: 0 %
Lymphocytes Relative: 35 %
Lymphs Abs: 1.9 10*3/uL (ref 0.7–4.0)
MCH: 29 pg (ref 26.0–34.0)
MCHC: 32.3 g/dL (ref 30.0–36.0)
MCV: 90 fL (ref 80.0–100.0)
MONO ABS: 0.5 10*3/uL (ref 0.1–1.0)
MONOS PCT: 10 %
NEUTROS ABS: 2.7 10*3/uL (ref 1.7–7.7)
NEUTROS PCT: 49 %
PLATELETS: 180 10*3/uL (ref 150–400)
RBC: 5.2 MIL/uL (ref 4.22–5.81)
RDW: 13 % (ref 11.5–15.5)
WBC: 5.5 10*3/uL (ref 4.0–10.5)
nRBC: 0 % (ref 0.0–0.2)

## 2018-02-13 NOTE — Progress Notes (Signed)
   02/13/18 1241  OBSTRUCTIVE SLEEP APNEA  Have you ever been diagnosed with sleep apnea through a sleep study? No  If yes, do you have and use a CPAP or BPAP machine every night? 0  Do you snore loudly (loud enough to be heard through closed doors)?  1  Do you often feel tired, fatigued, or sleepy during the daytime (such as falling asleep during driving or talking to someone)? 0  Has anyone observed you stop breathing during your sleep? 1  Do you have, or are you being treated for high blood pressure? 1  BMI more than 35 kg/m2? 1  Age > 50 (1-yes) 1  Neck circumference greater than:Male 16 inches or larger, Male 17inches or larger? 1  Male Gender (Yes=1) 1  Obstructive Sleep Apnea Score 7  Score 5 or greater  Results sent to PCP

## 2018-02-18 ENCOUNTER — Ambulatory Visit (HOSPITAL_COMMUNITY): Payer: PPO

## 2018-02-18 ENCOUNTER — Ambulatory Visit (HOSPITAL_COMMUNITY): Payer: PPO | Admitting: Anesthesiology

## 2018-02-18 ENCOUNTER — Ambulatory Visit (HOSPITAL_COMMUNITY)
Admission: RE | Admit: 2018-02-18 | Discharge: 2018-02-18 | Disposition: A | Discharge status: Home / Self Care | Payer: PPO | Source: Ambulatory Visit | Attending: Podiatry | Admitting: Podiatry

## 2018-02-18 ENCOUNTER — Encounter (HOSPITAL_COMMUNITY): Payer: Self-pay | Admitting: *Deleted

## 2018-02-18 ENCOUNTER — Encounter (HOSPITAL_COMMUNITY)
Admission: RE | Disposition: A | Discharge status: Home / Self Care | Payer: Self-pay | Source: Ambulatory Visit | Attending: Podiatry

## 2018-02-18 DIAGNOSIS — M19071 Primary osteoarthritis, right ankle and foot: Secondary | ICD-10-CM | POA: Insufficient documentation

## 2018-02-18 DIAGNOSIS — M2141 Flat foot [pes planus] (acquired), right foot: Secondary | ICD-10-CM | POA: Insufficient documentation

## 2018-02-18 DIAGNOSIS — I10 Essential (primary) hypertension: Secondary | ICD-10-CM | POA: Insufficient documentation

## 2018-02-18 DIAGNOSIS — M79671 Pain in right foot: Secondary | ICD-10-CM | POA: Diagnosis not present

## 2018-02-18 DIAGNOSIS — M76821 Posterior tibial tendinitis, right leg: Secondary | ICD-10-CM | POA: Insufficient documentation

## 2018-02-18 DIAGNOSIS — M216X1 Other acquired deformities of right foot: Secondary | ICD-10-CM | POA: Diagnosis not present

## 2018-02-18 DIAGNOSIS — Z9889 Other specified postprocedural states: Secondary | ICD-10-CM

## 2018-02-18 DIAGNOSIS — R52 Pain, unspecified: Secondary | ICD-10-CM

## 2018-02-18 DIAGNOSIS — Z981 Arthrodesis status: Secondary | ICD-10-CM | POA: Diagnosis not present

## 2018-02-18 HISTORY — PX: FOOT ARTHRODESIS: SHX1655

## 2018-02-18 HISTORY — PX: FLEXOR TENOTOMY: SHX6342

## 2018-02-18 SURGERY — FUSION, JOINT, FOOT
Anesthesia: General | Site: Foot | Laterality: Right

## 2018-02-18 MED ORDER — LIDOCAINE HCL (CARDIAC) PF 50 MG/5ML IV SOSY
PREFILLED_SYRINGE | INTRAVENOUS | Status: DC | PRN
  Administered 2018-02-18: 60 mg via INTRAVENOUS

## 2018-02-18 MED ORDER — CHLORHEXIDINE GLUCONATE CLOTH 2 % EX PADS: 6.0000 | MEDICATED_PAD | Freq: Once | CUTANEOUS | Status: DC

## 2018-02-18 MED ORDER — SUCCINYLCHOLINE 20MG/ML (10ML) SYRINGE FOR MEDFUSION PUMP - OPTIME
INTRAMUSCULAR | Status: DC | PRN
  Administered 2018-02-18: 140 mg via INTRAVENOUS

## 2018-02-18 MED ORDER — CEFAZOLIN SODIUM-DEXTROSE 1-4 GM/50ML-% IV SOLN
INTRAVENOUS | Status: DC | PRN
  Administered 2018-02-18: 1 g via INTRAVENOUS

## 2018-02-18 MED ORDER — FENTANYL CITRATE (PF) 100 MCG/2ML IJ SOLN
INTRAMUSCULAR | Status: DC | PRN
  Administered 2018-02-18: 50 ug via INTRAVENOUS
  Administered 2018-02-18 (×2): 25 ug via INTRAVENOUS
  Administered 2018-02-18 (×4): 50 ug via INTRAVENOUS
  Administered 2018-02-18: 25 ug via INTRAVENOUS

## 2018-02-18 MED ORDER — MIDAZOLAM HCL 2 MG/2ML IJ SOLN
INTRAMUSCULAR | Status: AC
  Filled 2018-02-18: qty 2

## 2018-02-18 MED ORDER — PROPOFOL 10 MG/ML IV BOLUS
INTRAVENOUS | Status: AC
  Filled 2018-02-18: qty 40

## 2018-02-18 MED ORDER — KETOROLAC TROMETHAMINE 30 MG/ML IJ SOLN
30.0000 mg | Freq: Once | INTRAMUSCULAR | Status: AC
  Administered 2018-02-18: 30 mg via INTRAVENOUS

## 2018-02-18 MED ORDER — LACTATED RINGERS IV SOLN
INTRAVENOUS | Status: DC
  Administered 2018-02-18 (×2): via INTRAVENOUS

## 2018-02-18 MED ORDER — FENTANYL CITRATE (PF) 250 MCG/5ML IJ SOLN
INTRAMUSCULAR | Status: AC
  Filled 2018-02-18: qty 5

## 2018-02-18 MED ORDER — BUPIVACAINE HCL (PF) 0.5 % IJ SOLN
INTRAMUSCULAR | Status: DC | PRN
  Administered 2018-02-18: 30 mL

## 2018-02-18 MED ORDER — MIDAZOLAM HCL 2 MG/2ML IJ SOLN: 0.5000 mg | Freq: Once | INTRAMUSCULAR | Status: DC | PRN

## 2018-02-18 MED ORDER — ROCURONIUM 10MG/ML (10ML) SYRINGE FOR MEDFUSION PUMP - OPTIME
INTRAVENOUS | Status: DC | PRN
  Administered 2018-02-18: 10 mg via INTRAVENOUS
  Administered 2018-02-18: 8 mg via INTRAVENOUS
  Administered 2018-02-18: 10 mg via INTRAVENOUS
  Administered 2018-02-18: 20 mg via INTRAVENOUS
  Administered 2018-02-18 (×2): 10 mg via INTRAVENOUS
  Administered 2018-02-18: 22 mg via INTRAVENOUS
  Administered 2018-02-18: 10 mg via INTRAVENOUS

## 2018-02-18 MED ORDER — EPHEDRINE 5 MG/ML INJ
INTRAVENOUS | Status: AC
  Filled 2018-02-18: qty 30

## 2018-02-18 MED ORDER — ONDANSETRON HCL 4 MG/2ML IJ SOLN
INTRAMUSCULAR | Status: DC | PRN
  Administered 2018-02-18: 4 mg via INTRAVENOUS

## 2018-02-18 MED ORDER — FENTANYL CITRATE (PF) 100 MCG/2ML IJ SOLN
INTRAMUSCULAR | Status: AC
  Filled 2018-02-18: qty 2

## 2018-02-18 MED ORDER — ACETAMINOPHEN 10 MG/ML IV SOLN
INTRAVENOUS | Status: AC
  Filled 2018-02-18: qty 100

## 2018-02-18 MED ORDER — CEFAZOLIN SODIUM-DEXTROSE 2-4 GM/100ML-% IV SOLN
INTRAVENOUS | Status: AC
  Filled 2018-02-18: qty 100

## 2018-02-18 MED ORDER — MIDAZOLAM HCL 5 MG/5ML IJ SOLN
INTRAMUSCULAR | Status: DC | PRN
  Administered 2018-02-18: 2 mg via INTRAVENOUS

## 2018-02-18 MED ORDER — ONDANSETRON HCL 4 MG/2ML IJ SOLN
INTRAMUSCULAR | Status: AC
  Filled 2018-02-18: qty 2

## 2018-02-18 MED ORDER — LIDOCAINE 2% (20 MG/ML) 5 ML SYRINGE
INTRAMUSCULAR | Status: AC
  Filled 2018-02-18: qty 5

## 2018-02-18 MED ORDER — CEFAZOLIN SODIUM 10 G IJ SOLR
3.0000 g | INTRAMUSCULAR | Status: AC
  Administered 2018-02-18: 3 g via INTRAVENOUS
  Filled 2018-02-18: qty 3000

## 2018-02-18 MED ORDER — HYDROMORPHONE HCL 1 MG/ML IJ SOLN
INTRAMUSCULAR | Status: AC
  Filled 2018-02-18: qty 0.5

## 2018-02-18 MED ORDER — PROPOFOL 10 MG/ML IV BOLUS
INTRAVENOUS | Status: DC | PRN
  Administered 2018-02-18: 180 mg via INTRAVENOUS

## 2018-02-18 MED ORDER — 0.9 % SODIUM CHLORIDE (POUR BTL) OPTIME
TOPICAL | Status: DC | PRN
  Administered 2018-02-18 (×2): 1000 mL

## 2018-02-18 MED ORDER — PROMETHAZINE HCL 25 MG/ML IJ SOLN
6.2500 mg | INTRAMUSCULAR | Status: DC | PRN
  Administered 2018-02-18: 6.25 mg via INTRAVENOUS
  Filled 2018-02-18: qty 1

## 2018-02-18 MED ORDER — KETOROLAC TROMETHAMINE 30 MG/ML IJ SOLN
INTRAMUSCULAR | Status: AC
  Filled 2018-02-18: qty 1

## 2018-02-18 MED ORDER — ACETAMINOPHEN 10 MG/ML IV SOLN
1000.0000 mg | Freq: Four times a day (QID) | INTRAVENOUS | Status: DC
  Administered 2018-02-18: 1000 mg via INTRAVENOUS

## 2018-02-18 MED ORDER — HYDROMORPHONE HCL 1 MG/ML IJ SOLN
0.5000 mg | INTRAMUSCULAR | Status: DC | PRN
  Administered 2018-02-18: 0.5 mg via INTRAVENOUS

## 2018-02-18 MED ORDER — CEFAZOLIN SODIUM-DEXTROSE 1-4 GM/50ML-% IV SOLN
INTRAVENOUS | Status: AC
  Filled 2018-02-18: qty 50

## 2018-02-18 MED ORDER — BUPIVACAINE HCL (PF) 0.5 % IJ SOLN
INTRAMUSCULAR | Status: AC
  Filled 2018-02-18: qty 30

## 2018-02-18 MED ORDER — SUCCINYLCHOLINE CHLORIDE 200 MG/10ML IV SOSY
PREFILLED_SYRINGE | INTRAVENOUS | Status: AC
  Filled 2018-02-18: qty 10

## 2018-02-18 MED ORDER — EPHEDRINE SULFATE 50 MG/ML IJ SOLN
INTRAMUSCULAR | Status: DC | PRN
  Administered 2018-02-18: 10 mg via INTRAVENOUS

## 2018-02-18 SURGICAL SUPPLY — 70 items
ALLOGRAFT CANC WEDGE 8 (Tissue) ×2 IMPLANT
APL SKNCLS STERI-STRIP NONHPOA (GAUZE/BANDAGES/DRESSINGS) ×1
BANDAGE ELASTIC 4 LF NS (GAUZE/BANDAGES/DRESSINGS) ×4 IMPLANT
BANDAGE ELASTIC 6 LF NS (GAUZE/BANDAGES/DRESSINGS) ×4 IMPLANT
BANDAGE ESMARK 4X12 BL STRL LF (DISPOSABLE) ×1 IMPLANT
BENZOIN TINCTURE PRP APPL 2/3 (GAUZE/BANDAGES/DRESSINGS) ×3 IMPLANT
BIT DRILL LONG ACUTRAK2 4.7 (BIT) IMPLANT
BIT DRILL LONG ACUTRAK2 7.5 (BIT) IMPLANT
BIT DRILL MEMOFIX 2.0 (BIT) ×2 IMPLANT
BLADE AVERAGE 25MMX9MM (BLADE) ×1
BLADE AVERAGE 25X9 (BLADE) ×2 IMPLANT
BLADE SURG 15 STRL LF DISP TIS (BLADE) ×2 IMPLANT
BLADE SURG 15 STRL SS (BLADE) ×6
BNDG CMPR 12X4 ELC STRL LF (DISPOSABLE) ×1
BNDG CMPR MED 5X4 ELC HKLP NS (GAUZE/BANDAGES/DRESSINGS) ×2
BNDG CMPR MED 5X6 ELC HKLP NS (GAUZE/BANDAGES/DRESSINGS) ×2
BNDG CONFORM 2 STRL LF (GAUZE/BANDAGES/DRESSINGS) IMPLANT
BNDG ESMARK 4X12 BLUE STRL LF (DISPOSABLE) ×3
BNDG GAUZE ELAST 4 BULKY (GAUZE/BANDAGES/DRESSINGS) ×3 IMPLANT
CHLORAPREP W/TINT 26ML (MISCELLANEOUS) ×3 IMPLANT
CLOSURE WOUND 1/2 X4 (GAUZE/BANDAGES/DRESSINGS) ×2
CLOTH BEACON ORANGE TIMEOUT ST (SAFETY) ×3 IMPLANT
COVER LIGHT HANDLE STERIS (MISCELLANEOUS) ×6 IMPLANT
CUFF TOURNIQUET SINGLE 34IN LL (TOURNIQUET CUFF) ×2 IMPLANT
DECANTER SPIKE VIAL GLASS SM (MISCELLANEOUS) ×3 IMPLANT
DRAPE C-ARM FOLDED MOBILE STRL (DRAPES) ×2 IMPLANT
DRAPE HALF SHEET 40X57 (DRAPES) ×2 IMPLANT
DRAPE OEC MINIVIEW 54X84 (DRAPES) ×3 IMPLANT
DRILL LONG ACUTRAK2 4.7 (BIT) ×3
DRILL LONG ACUTRAK2 7.5 (BIT) ×3
DRSG ADAPTIC 3X8 NADH LF (GAUZE/BANDAGES/DRESSINGS) ×3 IMPLANT
EINSTEIN IBM (Putty) ×2 IMPLANT
ELECT REM PT RETURN 9FT ADLT (ELECTROSURGICAL) ×3
ELECTRODE REM PT RTRN 9FT ADLT (ELECTROSURGICAL) ×1 IMPLANT
GAUZE 4X4 16PLY RFD (DISPOSABLE) ×2 IMPLANT
GAUZE SPONGE 4X4 12PLY STRL (GAUZE/BANDAGES/DRESSINGS) ×3 IMPLANT
GLOVE BIO SURGEON STRL SZ7.5 (GLOVE) ×3 IMPLANT
GLOVE BIOGEL PI IND STRL 7.0 (GLOVE) ×2 IMPLANT
GLOVE BIOGEL PI INDICATOR 7.0 (GLOVE) ×6
GOWN STRL REUS W/TWL LRG LVL3 (GOWN DISPOSABLE) ×7 IMPLANT
GUIDEWIRE ORTHO .094INX9.25IN (WIRE) ×2 IMPLANT
GUIDEWIRE ORTHO.062IN X 9.25IN (WIRE) ×4 IMPLANT
KIT TURNOVER CYSTO (KITS) ×3 IMPLANT
MANIFOLD NEPTUNE II (INSTRUMENTS) ×3 IMPLANT
NDL HYPO 27GX1-1/4 (NEEDLE) ×3 IMPLANT
NEEDLE HYPO 27GX1-1/4 (NEEDLE) ×6 IMPLANT
NS IRRIG 1000ML POUR BTL (IV SOLUTION) ×5 IMPLANT
PACK BASIC LIMB (CUSTOM PROCEDURE TRAY) ×3 IMPLANT
PAD ABD 5X9 TENDERSORB (GAUZE/BANDAGES/DRESSINGS) ×2 IMPLANT
PAD ARMBOARD 7.5X6 YLW CONV (MISCELLANEOUS) ×3 IMPLANT
SCREW ACUTRAK2  4.7 35.0MM (Screw) ×2 IMPLANT
SCREW ACUTRAK2  4.7 40.0MM (Screw) ×2 IMPLANT
SCREW ACUTRAK2  4.7 50.0MM (Screw) ×2 IMPLANT
SCREW ACUTRAK2  7.5 80.0MM (Screw) ×2 IMPLANT
SCREW ACUTRAK2  7.5 85.0MM (Screw) ×2 IMPLANT
SCREW ACUTRAK2 4.7 35.0MM (Screw) IMPLANT
SCREW ACUTRAK2 4.7 40.0MM (Screw) IMPLANT
SCREW ACUTRAK2 4.7 50.0MM (Screw) IMPLANT
SCREW ACUTRAK2 7.5 80.0MM (Screw) IMPLANT
SCREW ACUTRAK2 7.5 85.0MM (Screw) IMPLANT
SET BASIN LINEN APH (SET/KITS/TRAYS/PACK) ×3 IMPLANT
SPLINT FIBERGLASS 4X30 (CAST SUPPLIES) ×2 IMPLANT
SPONGE LAP 18X18 RF (DISPOSABLE) ×3 IMPLANT
STAPLE MEMOFIX 18X14X14 (Screw) ×2 IMPLANT
STAPLER VISISTAT 35W (STAPLE) ×2 IMPLANT
STRIP CLOSURE SKIN 1/2X4 (GAUZE/BANDAGES/DRESSINGS) ×4 IMPLANT
SUT ETHILON 3 0 FSL (SUTURE) ×2 IMPLANT
SUT VICRYL AB 3-0 FS1 BRD 27IN (SUTURE) ×5 IMPLANT
SYR CONTROL 10ML LL (SYRINGE) ×6 IMPLANT
synthetic prepadded splint ×2 IMPLANT

## 2018-02-18 NOTE — Anesthesia Postprocedure Evaluation (Signed)
Anesthesia Post Note  Patient: BRASON BERTHELOT  Procedure(s) Performed: TRIPLE ARTHRODESIS RIGHT FOOT (Right Foot) PERCUTANEOUS FLEXOR TENOTOMY/APPLICATION OF BK POSTERIOR SPLINT RLE (Right Foot)  Patient location during evaluation: Short Stay Anesthesia Type: General Level of consciousness: awake and alert and patient cooperative Pain management: satisfactory to patient Vital Signs Assessment: post-procedure vital signs reviewed and stable Respiratory status: spontaneous breathing Cardiovascular status: stable Postop Assessment: no apparent nausea or vomiting Anesthetic complications: no     Last Vitals:  Vitals:   02/18/18 1501 02/18/18 1507  BP: 121/60 (!) 118/58  Pulse: 74 72  Resp: 16 16  Temp:  36.8 C  SpO2: 93% 94%    Last Pain:  Vitals:   02/18/18 1507  TempSrc: Oral  PainSc: 5                  Nekita Pita

## 2018-02-18 NOTE — Op Note (Signed)
OPERATIVE NOTE  DATE OF PROCEDURE 02/18/2018  SURGEON Marcheta Grammes, DPM  ASSISTANT SURGEON Jilda Panda, DPM  OR STAFF Circulator: Towanda Malkin, RN Radiology Technologist: Haynes Hoehn Relief Circulator: CoxGershon Mussel, RN Relief Scrub: Angelena Form A Scrub Person: Karin Lieu, CST Vendor Representative : Amelia Jo   PREOPERATIVE DIAGNOSIS 1.  Acquired flat foot deformity, right foot 2.  Posterior tibal tendon dysfunction, right foot 3.  Osteoarthritis, right foot 4.  Equinus, right lower extremity 5.  Pain, right foot  POSTOPERATIVE DIAGNOSIS Same  PROCEDURE 1.  Triple arthrodesis, right foot 2.  Percutaneous Achilles tendon lengthening, right lower extremity 3.  Application of below knee fiberglass splint, right lower extremity  ANESTHESIA General   HEMOSTASIS Pneumatic thigh tourniquet set at 300 mmHg  ESTIMATED BLOOD LOSS 100 cc  MATERIALS USED Acumed screw x 3 Integra allograft bone wedge Integra bone graft  INJECTABLES 0.5% Marcaine plain  PATHOLOGY None  COMPLICATIONS None  DESCRIPTION OF THE PROCEDURE:  The patient was brought to the operating room and placed on the operative table in the supine position.  General anesthesia was administered.  A pneumatic thigh tourniquet was applied to the patient's right lower extremity.  The limb was then elevated exsanguinated and the pneumatic ankle tourniquet inflated to 300 mmHg.  Attention was directed to the medial aspect of the right foot.  A linear longitudinal incision was made overlying the talonavicular joint.  Dissection was continued deep down to the level of the talonavicular joint.  Approximately 75% of the talar head was uncovered medially.  The medial portion of the talar head was soft.  The lateral portion of the talar head was firm.  The articular surface was resected from the talar head using a curette.  The articular surface was resected from the corresponding  navicular using a curette.  The medial portion of the navicular was soft.  The lateral portion of the navicular was firm.  Attention was directed to the lateral aspect of the right rear foot.  A curvilinear incision was made along the lateral aspect of the right foot overlying the subtalar joint and calcaneocuboid joint.  Dissection was continued deep.  The peroneal tendons were identified and retracted inferiorly.  The muscle belly of the extensor digitorum brevis was reflected dorsally.  Significant degenerative changes of the lateral aspect of the subtalar joint was identified.  Adaptive changes consistent with a contact from the distal fibula was noted.  The adaptive changes were removed with a bone rongeur.  The subtalar joint was distracted using a lamina spreader to allow for improved visualization.  The articular surfaces of the subtalar joint were removed using a curette.  Attention was directed distally to the level of the calcaneocuboid joint.  Adaptive changes were noted along the dorsal and lateral aspects of the calcaneal cuboid joint.  The adaptive changes were resected using a bone rongeur.  The articular surfaces of the calcaneal cuboid joint were resected using a power bone saw and curette.  All surgical wounds were irrigated with copious amounts of sterile irrigant.  Integra sizers were used to determine appropriate size of the wedge allograft.  A size 8 was used in place without difficulty.  The bone graft was stable with good compression.  Therefore no fixation was deemed necessary.  Attention was directed to the posterior inferior aspect of the heel.  A stab incision was made allowing for passage of a guidewire from the [place] set.  An Acumed Acutrack 2  7.5 screw 80 mm in length was inserted across the subtalar joint.  I was then unable to obtain good visualization of the screw position on AP of the ankle using the mini C arm.  A large C arm was then brought in to allow for improved  visualization.  The position of the screw appeared too short.  It was removed and replaced with an 85 mm screw.  Adequate compression was obtained.  The tourniquet was deflated and a prompt hyperemic response was noted to all digits of the right foot.  The tourniquet was not reinflated.  Attention was directed to the talonavicular joint.  An Acumed AccuTrack 4.7 screw 50 mm in length was inserted across the talonavicular joint.  It appeared too long.  It was removed and replaced with an AccuTrack screw 40 mm in length.  Adequate compression was obtained.  I attempted to use a rim of thick staple measuring 18 x 14 x 14.  However, it was not stable due to the soft nature of the bone and pulled through the navicular.  A single screw was positioned from a medial distal to proximal lateral direction adequate compression was noted.  Position of the screws were evaluated.  Radiographically the screws appeared to only purchase a small portion of the navicular.  Given the adequate compression and soft nature of the bone,  I elected not to reposition the screws.  All surgical wounds were irrigated with copious amounts of sterile irrigant.  [employer] bone matrix was reconstituted using the patient's blood and used to pack the subtalar joint.  The periosteal and capsular structures were reapproximated using 3-0 Vicryl.  The subcutaneous structures were reapproximated using 4-0 Vicryl.  The skin was closed using skin staples.  Dorsiflexion of the right foot was limited.  Attention was directed to the posterior aspect of the right heel.  3 small incisions were made along the Achilles tendon.  2 were positioned medially and 1 laterally.  3 hemisections the tendon were performed.  Dorsiflexion improved.  Each incision was closed using skin staples.  An ankle block was then performed using 0.5% Marcaine plain.  A sterile compressive dressing was applied to the right foot.  A posterior splint was applied to the right  lower extremity with the ankle joint in neutral position.  The patient tolerated procedures and anesthesia well.  He was transferred from the operating room to the postanesthesia care unit with vital signs stable and vascular status intact to all digits of the operative foot.

## 2018-02-18 NOTE — Anesthesia Preprocedure Evaluation (Signed)
Anesthesia Evaluation  Patient identified by MRN, date of birth, ID band Patient awake    Reviewed: Allergy & Precautions, NPO status , Patient's Chart, lab work & pertinent test results  Airway Mallampati: I  TM Distance: >3 FB Neck ROM: Full    Dental no notable dental hx. (+) Teeth Intact   Pulmonary neg pulmonary ROS,  occ dips tobacco    Pulmonary exam normal breath sounds clear to auscultation       Cardiovascular Exercise Tolerance: Good hypertension, Pt. on medications negative cardio ROS Normal cardiovascular examI Rhythm:Regular Rate:Normal     Neuro/Psych Depression negative neurological ROS  negative psych ROS   GI/Hepatic negative GI ROS, Neg liver ROS,   Endo/Other  negative endocrine ROS  Renal/GU negative Renal ROS  negative genitourinary   Musculoskeletal  (+) Arthritis , Osteoarthritis,    Abdominal   Peds negative pediatric ROS (+)  Hematology negative hematology ROS (+)   Anesthesia Other Findings   Reproductive/Obstetrics negative OB ROS                             Anesthesia Physical Anesthesia Plan  ASA: II  Anesthesia Plan: General   Post-op Pain Management:    Induction: Intravenous  PONV Risk Score and Plan:   Airway Management Planned: Oral ETT  Additional Equipment:   Intra-op Plan:   Post-operative Plan: Extubation in OR  Informed Consent: I have reviewed the patients History and Physical, chart, labs and discussed the procedure including the risks, benefits and alternatives for the proposed anesthesia with the patient or authorized representative who has indicated his/her understanding and acceptance.   Dental advisory given  Plan Discussed with: CRNA  Anesthesia Plan Comments:         Anesthesia Quick Evaluation

## 2018-02-18 NOTE — Brief Op Note (Signed)
BRIEF OPERATIVE NOTE  DATE OF PROCEDURE 02/18/2018  SURGEON Marcheta Grammes, DPM  ASSISTANT SURGEON Jilda Panda, DPM  OR STAFF Circulator: Towanda Malkin, RN Radiology Technologist: Haynes Hoehn Relief Circulator: CoxGershon Mussel, RN Relief Scrub: Angelena Form A Scrub Person: Karin Lieu, CST Vendor Representative : Amelia Jo   PREOPERATIVE DIAGNOSIS 1.  Acquired flat foot deformity, right foot 2.  Posterior tibal tendon dysfunction, right foot 3.  Osteoarthritis, right foot 4.  Equinus, right lower extremity 5.  Pain, right foot  POSTOPERATIVE DIAGNOSIS Same  PROCEDURE 1.  Triple arthrodesis, right foot 2.  Percutaneous Achilles tendon lengthening, right lower extremity 3.  Application of below knee fiberglass splint, right lower extremity  ANESTHESIA General   HEMOSTASIS Pneumatic thigh tourniquet set at 300 mmHg  ESTIMATED BLOOD LOSS 100 cc  MATERIALS USED Acumed screw x 3 Integra allograft bone wedge Integra bone graft  INJECTABLES 0.5% Marcaine plain  PATHOLOGY None  COMPLICATIONS None

## 2018-02-18 NOTE — Transfer of Care (Signed)
Immediate Anesthesia Transfer of Care Note  Patient: Ethan Oliver  Procedure(s) Performed: TRIPLE ARTHRODESIS RIGHT FOOT (Right Foot) PERCUTANEOUS FLEXOR TENOTOMY/APPLICATION OF BK POSTERIOR SPLINT RLE (Right Foot)  Patient Location: PACU  Anesthesia Type:General  Level of Consciousness: awake  Airway & Oxygen Therapy: Patient Spontanous Breathing  Post-op Assessment: Report given to RN  Post vital signs: Reviewed  Last Vitals:  Vitals Value Taken Time  BP 130/63 02/18/2018  1:51 PM  Temp 36.8 C 02/18/2018  1:48 PM  Pulse 79 02/18/2018  2:00 PM  Resp 16 02/18/2018  2:00 PM  SpO2 100 % 02/18/2018  2:00 PM  Vitals shown include unvalidated device data.  Last Pain:  Vitals:   02/18/18 1348  TempSrc:   PainSc: 8       Patients Stated Pain Goal: 7 (01/77/93 9030)  Complications: No apparent anesthesia complications

## 2018-02-18 NOTE — Discharge Instructions (Signed)
These instructions will give you an idea of what to expect after surgery and how to manage issues that may arise before your first post op office visit.  Pain Management Pain is best managed by staying ahead of it. If pain gets out of control, it is difficult to get it back under control. Local anesthesia that lasts 6-8 hours is used to numb the foot and decrease pain.  For the best pain control, take the pain medication every 4 hours for the first 2 days post op. On the third day pain medication can be taken as needed.   Post Op Nausea Nausea is common after surgery, so it is managed proactively.  If prescribed, use the prescribed nausea medication regularly for the first 2 days post op.  Bandages Do not worry if there is blood on the bandage. What looks like a lot of blood on the bandage is actually a small amount. Blood on the dressing spreads out as it is absorbed by the gauze, the same way a drop of water spreads out on a paper towel.  If the bandages feel wet or dry, stiff and uncomfortable, call the office during office hours and we will schedule a time for you to have the bandage changed.  Unless you are specifically told otherwise, we will do the first bandage change in the office.  Keep your bandage dry. If the bandage becomes wet or soiled, notify the office and we will schedule a time to change the bandage.  Activity It is best to spend most of the first 2 days after surgery lying down with the foot elevated above the level of your heart. You may NOT put weight on your right lower extremity. You may only get up to go to the restroom.  Driving Do not drive until you are able to respond in an emergency (i.e. slam on the brakes). This usually occurs after the bone has healed - 6 to 8 weeks.  Call the Office If you have a fever over 101F.  If you have increasing pain after the initial post op pain has settled down.  If you have increasing redness, swelling, or drainage.  If you  have any questions or concerns.  General Anesthesia, Adult, Care After These instructions provide you with information about caring for yourself after your procedure. Your health care provider may also give you more specific instructions. Your treatment has been planned according to current medical practices, but problems sometimes occur. Call your health care provider if you have any problems or questions after your procedure. What can I expect after the procedure? After the procedure, it is common to have:  Vomiting.  A sore throat.  Mental slowness.  It is common to feel:  Nauseous.  Cold or shivery.  Sleepy.  Tired.  Sore or achy, even in parts of your body where you did not have surgery.  Follow these instructions at home: For at least 24 hours after the procedure:  Do not: ? Participate in activities where you could fall or become injured. ? Drive. ? Use heavy machinery. ? Drink alcohol. ? Take sleeping pills or medicines that cause drowsiness. ? Make important decisions or sign legal documents. ? Take care of children on your own.  Rest. Eating and drinking  If you vomit, drink water, juice, or soup when you can drink without vomiting.  Drink enough fluid to keep your urine clear or pale yellow.  Make sure you have little or no nausea before eating  solid foods.  Follow the diet recommended by your health care provider. General instructions  Have a responsible adult stay with you until you are awake and alert.  Return to your normal activities as told by your health care provider. Ask your health care provider what activities are safe for you.  Take over-the-counter and prescription medicines only as told by your health care provider.  If you smoke, do not smoke without supervision.  Keep all follow-up visits as told by your health care provider. This is important. Contact a health care provider if:  You continue to have nausea or vomiting at home, and  medicines are not helpful.  You cannot drink fluids or start eating again.  You cannot urinate after 8-12 hours.  You develop a skin rash.  You have fever.  You have increasing redness at the site of your procedure. Get help right away if:  You have difficulty breathing.  You have chest pain.  You have unexpected bleeding.  You feel that you are having a life-threatening or urgent problem. This information is not intended to replace advice given to you by your health care provider. Make sure you discuss any questions you have with your health care provider. Document Released: 06/03/2000 Document Revised: 07/31/2015 Document Reviewed: 02/09/2015 Elsevier Interactive Patient Education  [employer].

## 2018-02-18 NOTE — H&P (Signed)
HISTORY AND PHYSICAL INTERVAL NOTE:  02/18/2018  8:09 AM  Ethan Oliver  has presented today for surgery, with the diagnosis of acquired flat foot deformity right foot, posterior tibal tendon dysfuction right foot, osteoarthritis right foot, equinus RLE and pain right foot.  The various methods of treatment have been discussed with the patient.  No guarantees were given.  After consideration of risks, benefits and other options for treatment, the patient has consented to surgery.  I have reviewed the patients' chart and labs.    Patient Vitals for the past 24 hrs:  BP Temp Temp src Pulse Resp SpO2  02/18/18 0722 (!) 157/98 98.5 F (36.9 C) Oral 67 20 97 %    A history and physical examination was performed in my office.  The patient was reexamined.  There have been no changes to this history and physical examination.  Marcheta Grammes, DPM

## 2018-02-18 NOTE — Anesthesia Procedure Notes (Signed)
Procedure Name: Intubation Date/Time: 02/18/2018 8:56 AM Performed by: Ollen Bowl, CRNA Pre-anesthesia Checklist: Patient identified, Patient being monitored, Timeout performed, Emergency Drugs available and Suction available Patient Re-evaluated:Patient Re-evaluated prior to induction Oxygen Delivery Method: Circle System Utilized Preoxygenation: Pre-oxygenation with 100% oxygen Induction Type: IV induction Ventilation: Mask ventilation without difficulty Laryngoscope Size: Mac and 4 Grade View: Grade I Tube type: Oral Tube size: 8.0 mm Number of attempts: 1 Airway Equipment and Method: stylet Placement Confirmation: ETT inserted through vocal cords under direct vision,  positive ETCO2 and breath sounds checked- equal and bilateral Secured at: 23 cm Tube secured with: Tape Dental Injury: Teeth and Oropharynx as per pre-operative assessment

## 2018-02-26 ENCOUNTER — Encounter (HOSPITAL_COMMUNITY): Payer: Self-pay | Admitting: Podiatry

## 2018-03-20 DIAGNOSIS — Z4889 Encounter for other specified surgical aftercare: Secondary | ICD-10-CM | POA: Diagnosis not present

## 2018-04-03 DIAGNOSIS — Z4889 Encounter for other specified surgical aftercare: Secondary | ICD-10-CM | POA: Diagnosis not present

## 2018-04-17 DIAGNOSIS — Z4889 Encounter for other specified surgical aftercare: Secondary | ICD-10-CM | POA: Diagnosis not present

## 2018-05-08 DIAGNOSIS — Z4889 Encounter for other specified surgical aftercare: Secondary | ICD-10-CM | POA: Diagnosis not present

## 2018-05-29 DIAGNOSIS — Z4889 Encounter for other specified surgical aftercare: Secondary | ICD-10-CM | POA: Diagnosis not present

## 2018-05-29 DIAGNOSIS — M960 Pseudarthrosis after fusion or arthrodesis: Secondary | ICD-10-CM | POA: Diagnosis not present

## 2018-06-19 DIAGNOSIS — Z4889 Encounter for other specified surgical aftercare: Secondary | ICD-10-CM | POA: Diagnosis not present

## 2018-06-19 DIAGNOSIS — I361 Nonrheumatic tricuspid (valve) insufficiency: Secondary | ICD-10-CM | POA: Diagnosis not present

## 2018-06-19 DIAGNOSIS — M960 Pseudarthrosis after fusion or arthrodesis: Secondary | ICD-10-CM | POA: Diagnosis not present

## 2018-07-07 DIAGNOSIS — M960 Pseudarthrosis after fusion or arthrodesis: Secondary | ICD-10-CM | POA: Diagnosis not present

## 2018-07-17 DIAGNOSIS — Z4889 Encounter for other specified surgical aftercare: Secondary | ICD-10-CM | POA: Diagnosis not present

## 2018-07-17 DIAGNOSIS — M960 Pseudarthrosis after fusion or arthrodesis: Secondary | ICD-10-CM | POA: Diagnosis not present

## 2018-07-23 DIAGNOSIS — E119 Type 2 diabetes mellitus without complications: Secondary | ICD-10-CM | POA: Diagnosis not present

## 2018-08-05 DIAGNOSIS — M5489 Other dorsalgia: Secondary | ICD-10-CM | POA: Diagnosis not present

## 2018-08-05 DIAGNOSIS — M47816 Spondylosis without myelopathy or radiculopathy, lumbar region: Secondary | ICD-10-CM | POA: Diagnosis not present

## 2018-08-05 DIAGNOSIS — M5416 Radiculopathy, lumbar region: Secondary | ICD-10-CM | POA: Diagnosis not present

## 2018-08-05 DIAGNOSIS — M545 Low back pain: Secondary | ICD-10-CM | POA: Diagnosis not present

## 2018-08-28 DIAGNOSIS — Z4889 Encounter for other specified surgical aftercare: Secondary | ICD-10-CM | POA: Diagnosis not present

## 2018-10-27 DIAGNOSIS — E1165 Type 2 diabetes mellitus with hyperglycemia: Secondary | ICD-10-CM | POA: Diagnosis not present

## 2018-11-20 DIAGNOSIS — M960 Pseudarthrosis after fusion or arthrodesis: Secondary | ICD-10-CM | POA: Diagnosis not present

## 2018-11-24 DIAGNOSIS — Z23 Encounter for immunization: Secondary | ICD-10-CM | POA: Diagnosis not present

## 2019-02-01 DIAGNOSIS — M5416 Radiculopathy, lumbar region: Secondary | ICD-10-CM | POA: Diagnosis not present

## 2019-02-01 DIAGNOSIS — M5489 Other dorsalgia: Secondary | ICD-10-CM | POA: Diagnosis not present

## 2019-02-01 DIAGNOSIS — M47816 Spondylosis without myelopathy or radiculopathy, lumbar region: Secondary | ICD-10-CM | POA: Diagnosis not present

## 2019-02-01 DIAGNOSIS — M545 Low back pain: Secondary | ICD-10-CM | POA: Diagnosis not present

## 2019-02-03 DIAGNOSIS — E1165 Type 2 diabetes mellitus with hyperglycemia: Secondary | ICD-10-CM | POA: Diagnosis not present

## 2019-02-03 DIAGNOSIS — E119 Type 2 diabetes mellitus without complications: Secondary | ICD-10-CM | POA: Diagnosis not present

## 2019-02-03 DIAGNOSIS — Z136 Encounter for screening for cardiovascular disorders: Secondary | ICD-10-CM | POA: Diagnosis not present

## 2019-02-03 DIAGNOSIS — Z0000 Encounter for general adult medical examination without abnormal findings: Secondary | ICD-10-CM | POA: Diagnosis not present

## 2019-02-03 DIAGNOSIS — Z13220 Encounter for screening for lipoid disorders: Secondary | ICD-10-CM | POA: Diagnosis not present

## 2019-02-03 DIAGNOSIS — H538 Other visual disturbances: Secondary | ICD-10-CM | POA: Diagnosis not present

## 2019-02-03 DIAGNOSIS — Z125 Encounter for screening for malignant neoplasm of prostate: Secondary | ICD-10-CM | POA: Diagnosis not present

## 2019-05-14 ENCOUNTER — Other Ambulatory Visit: Payer: Self-pay

## 2019-05-14 ENCOUNTER — Ambulatory Visit
Admission: EM | Admit: 2019-05-14 | Discharge: 2019-05-14 | Disposition: A | Discharge status: Home / Self Care | Payer: PPO

## 2019-05-14 ENCOUNTER — Ambulatory Visit (INDEPENDENT_AMBULATORY_CARE_PROVIDER_SITE_OTHER): Payer: Medicare Other

## 2019-05-14 DIAGNOSIS — M25522 Pain in left elbow: Secondary | ICD-10-CM

## 2019-05-14 DIAGNOSIS — M19022 Primary osteoarthritis, left elbow: Secondary | ICD-10-CM | POA: Diagnosis not present

## 2019-05-14 DIAGNOSIS — L03114 Cellulitis of left upper limb: Secondary | ICD-10-CM

## 2019-05-14 DIAGNOSIS — M25422 Effusion, left elbow: Secondary | ICD-10-CM

## 2019-05-14 HISTORY — DX: Type 2 diabetes mellitus without complications: E11.9

## 2019-05-14 MED ORDER — MELOXICAM 7.5 MG PO TABS: 7.5000 mg | ORAL_TABLET | Freq: Every day | ORAL | 0 refills | Status: DC

## 2019-05-14 MED ORDER — DOXYCYCLINE HYCLATE 100 MG PO CAPS: 100.0000 mg | ORAL_CAPSULE | Freq: Two times a day (BID) | ORAL | 0 refills | Status: DC

## 2019-05-14 NOTE — ED Triage Notes (Signed)
Pt presents to UC w/ c/o left wrist pain  x1 month. Pt states yesterday pain went up to left elbow. Denies injury or pulling anything heavy. Limited ROM of elbow d/t pain. Hx arthritis.

## 2019-05-14 NOTE — ED Provider Notes (Addendum)
[facility]   [id number] 05/14/19 Arrival Time: 60  CC: LT elbow PAIN  SUBJECTIVE: History from: patient. Ethan Oliver is a 64 y.o. male complains of left elbow pain and swelling x 1 day.  Denies a precipitating event or specific injury.  Denies repetitive movement about the wrist or elbow.  Pain diffuse about back of elbow.  Describes the pain as intermittent and achy in character.  Has tried lidocaine gel without relief.  Symptoms are made worse with ROM about the elbow.  Denies similar symptoms in the past. Complains of associated swelling, weakness, and warmth to the touch.  Denies fever, chills, nausea, vomiting, erythema, ecchymosis,numbness and tingling.    ROS: As per HPI.  All other pertinent ROS negative.     Past Medical History:  Diagnosis Date  . Arthritis   . Chronic back pain   . Depression   . Diabetes mellitus without complication ([place])   . History of gout   . History of kidney stones   . Hypercholesteremia   . Hypertension    Past Surgical History:  Procedure Laterality Date  . ACHILLES TENDON SURGERY Left 12/29/2013   Procedure: PERCUTANEOUS TENDO-ACHILLES LENGTHENING;  Surgeon: Marcheta Grammes, DPM;  Location: [facility];  Service: Podiatry;  Laterality: Left;  . CALCANEAL OSTEOTOMY WITH ILIAC CREST BONE GRAFT AND REPAIR Railroad TENDON AND ACHILLES TENDON Left 12/29/2013   Procedure: CALCANEOCUBOID JOINT FUSION PERFORMED WITH BONE GRAFT;  Surgeon: Marcheta Grammes, DPM;  Location: [facility];  Service: Podiatry;  Laterality: Left;  . CARPAL TUNNEL RELEASE Bilateral   . Disk fusion     lumbar  . FLEXOR TENOTOMY Right 02/18/2018   Procedure: PERCUTANEOUS FLEXOR TENOTOMY/APPLICATION OF BK POSTERIOR SPLINT RLE;  Surgeon: Caprice Beaver, DPM;  Location: [facility];  Service: Podiatry;  Laterality: Right;  . FOOT ARTHRODESIS Left 12/29/2013   Procedure: TRIPLE ARTHRODESIS FOOT;  Surgeon: Marcheta Grammes, DPM;  Location: [facility];  Service:  Podiatry;  Laterality: Left;  . FOOT ARTHRODESIS Right 02/18/2018   Procedure: TRIPLE ARTHRODESIS RIGHT FOOT;  Surgeon: Caprice Beaver, DPM;  Location: [facility];  Service: Podiatry;  Laterality: Right;  . KNEE SURGERY Right    Surgery on this knee twice.    Allergies  Allergen Reactions  . Codeine Nausea Only   No current facility-administered medications on file prior to encounter.   Current Outpatient Medications on File Prior to Encounter  Medication Sig Dispense Refill  . glipiZIDE (GLUCOTROL) 5 MG tablet Take by mouth daily before breakfast.    . metFORMIN (GLUCOPHAGE) 500 MG tablet Take by mouth daily with breakfast.    . Oxycodone HCl 10 MG TABS Take by mouth.    . citalopram (CELEXA) 20 MG tablet Take 20 mg by mouth daily.    . hydrochlorothiazide (HYDRODIURIL) 12.5 MG tablet Take 12.5 mg by mouth daily.  3  . lisinopril (PRINIVIL,ZESTRIL) 20 MG tablet Take 20 mg by mouth daily.    . naproxen sodium (ALEVE) 220 MG tablet Take 220 mg by mouth 2 (two) times daily as needed (for pain or headache).     . pravastatin (PRAVACHOL) 40 MG tablet Take 40 mg by mouth daily.     Social History   Socioeconomic History  . Marital status: Married    Spouse name: Not on file  . Number of children: Not on file  . Years of education: Not on file  . Highest education level: Not on file  Occupational History  . Not on  file  Tobacco Use  . Smoking status: Never Smoker  . Smokeless tobacco: Former Systems developer    Types: Chew  Substance and Sexual Activity  . Alcohol use: No  . Drug use: No  . Sexual activity: Yes    Birth control/protection: None  Other Topics Concern  . Not on file  Social History Narrative  . Not on file   Social Determinants of Health   Financial Resource Strain:   . Difficulty of Paying Living Expenses: Not on file  Food Insecurity:   . Worried About Charity fundraiser in the Last Year: Not on file  . Ran Out of Food in the Last Year: Not on file    Transportation Needs:   . Lack of Transportation (Medical): Not on file  . Lack of Transportation (Non-Medical): Not on file  Physical Activity:   . Days of Exercise per Week: Not on file  . Minutes of Exercise per Session: Not on file  Stress:   . Feeling of Stress : Not on file  Social Connections:   . Frequency of Communication with Friends and Family: Not on file  . Frequency of Social Gatherings with Friends and Family: Not on file  . Attends Religious Services: Not on file  . Active Member of Clubs or Organizations: Not on file  . Attends Archivist Meetings: Not on file  . Marital Status: Not on file  Intimate Partner Violence:   . Fear of Current or Ex-Partner: Not on file  . Emotionally Abused: Not on file  . Physically Abused: Not on file  . Sexually Abused: Not on file   Family History  Problem Relation Age of Onset  . Healthy Mother   . Healthy Father     OBJECTIVE:  Vitals:   05/14/19 1033  BP: 134/81  Pulse: 72  Resp: 18  Temp: 98.3 F (36.8 C)  TempSrc: Oral  SpO2: 95%    General appearance: ALERT; in no acute distress.  Head: NCAT Lungs: Normal respiratory effort CV: Radial pulses 2+. Cap refill < 2 seconds Musculoskeletal: LT elbow Inspection: posterior aspect with swelling, warm to the touch, no overlying erythema or wounds present; small pustule present over proximal lateral forearm Palpation: Diffusely TTP over olecranon process, medial and lateral epicondyles ROM: LROM about the elbow Strength:  4/5 elbow flexion, 4/5 elbow extension, 4/5 grip strength Skin: warm and dry Neurologic: Ambulates without difficulty; Sensation intact about the upper extremities Psychological: alert and cooperative; normal mood and affect  DIAGNOSTIC STUDIES:  DG Elbow Complete Left  Result Date: 05/14/2019 CLINICAL DATA:  Chronic left elbow pain and limited range of motion. EXAM: LEFT ELBOW - COMPLETE 3+ VIEW COMPARISON:  None. FINDINGS: No acute bony  or joint abnormality. The patient has severe osteoarthritis about the elbow with multiple loose bodies in the joint. 2.6 cm body projects posterior to the olecranon. A second loose body in the periphery of the radiocapitellar joint measures 1.3 cm. There is a joint effusion. IMPRESSION: Severe osteoarthritis with multiple loose bodies. No acute finding. Electronically Signed   By: Inge Rise M.D.   On: 05/14/2019 11:14    X-rays negative for bony abnormalities including fracture, or dislocation.    I have reviewed the x-rays myself and the radiologist interpretation. I am in agreement with the radiologist interpretation.     ASSESSMENT & PLAN:  1. Left elbow pain   2. Elbow swelling, left   3. Cellulitis of left upper extremity  Meds ordered this encounter  Medications  . doxycycline (VIBRAMYCIN) 100 MG capsule    Sig: Take 1 capsule (100 mg total) by mouth 2 (two) times daily.    Dispense:  20 capsule    Refill:  0    Order Specific Question:   Supervising Provider    Answer:   Raylene Everts [id number]  . meloxicam (MOBIC) 7.5 MG tablet    Sig: Take 1 tablet (7.5 mg total) by mouth daily.    Dispense:  10 tablet    Refill:  0    Order Specific Question:   Supervising Provider    Answer:   Raylene Everts [id number]   X-rays negative for fracture or dislocation, just significant arthritis Given warmth and swelling will treat for possible cellulitis Doxycycline prescribed.  Take as directed and to completion Continue conservative management of rest, ice, and elevation Take mobic as needed for pain relief (may cause abdominal discomfort, ulcers, and GI bleeds avoid taking with other NSAIDs) Follow up with PCP if symptoms persist Return or go to the ER if you have any new or worsening symptoms (fever, chills, chest pain, increased swelling, redness, worsening symptoms despite treatment, etc...)   Reviewed expectations re: course of current medical issues. Questions  answered. Outlined signs and symptoms indicating need for more acute intervention. Patient verbalized understanding. After Visit Summary given.    Lestine Box, PA-C 05/14/19 [place], [place], PA-C 05/14/19 1145

## 2019-05-14 NOTE — Discharge Instructions (Signed)
X-rays negative for fracture or dislocation, just significant arthritis Given warmth and swelling will treat for possible cellulitis Doxycycline prescribed.  Take as directed and to completion Continue conservative management of rest, ice, and elevation Take mobic as needed for pain relief (may cause abdominal discomfort, ulcers, and GI bleeds avoid taking with other NSAIDs) Follow up with PCP if symptoms persist Return or go to the ER if you have any new or worsening symptoms (fever, chills, chest pain, increased swelling, redness, worsening symptoms despite treatment, etc...)

## 2019-05-17 DIAGNOSIS — M25522 Pain in left elbow: Secondary | ICD-10-CM | POA: Diagnosis not present

## 2019-05-31 DIAGNOSIS — R9389 Abnormal findings on diagnostic imaging of other specified body structures: Secondary | ICD-10-CM | POA: Diagnosis not present

## 2019-05-31 DIAGNOSIS — M25522 Pain in left elbow: Secondary | ICD-10-CM | POA: Diagnosis not present

## 2019-06-09 ENCOUNTER — Other Ambulatory Visit: Payer: Self-pay | Admitting: Family Medicine

## 2019-06-09 DIAGNOSIS — R9389 Abnormal findings on diagnostic imaging of other specified body structures: Secondary | ICD-10-CM

## 2019-06-09 DIAGNOSIS — M25522 Pain in left elbow: Secondary | ICD-10-CM

## 2019-06-14 ENCOUNTER — Other Ambulatory Visit: Payer: Self-pay

## 2019-06-14 ENCOUNTER — Ambulatory Visit
Admission: RE | Admit: 2019-06-14 | Discharge: 2019-06-14 | Disposition: A | Discharge status: Home / Self Care | Payer: PPO | Source: Ambulatory Visit | Attending: Family Medicine | Admitting: Family Medicine

## 2019-06-14 DIAGNOSIS — M25522 Pain in left elbow: Secondary | ICD-10-CM

## 2019-06-14 DIAGNOSIS — S56512A Strain of other extensor muscle, fascia and tendon at forearm level, left arm, initial encounter: Secondary | ICD-10-CM | POA: Diagnosis not present

## 2019-06-14 DIAGNOSIS — R9389 Abnormal findings on diagnostic imaging of other specified body structures: Secondary | ICD-10-CM

## 2019-06-21 DIAGNOSIS — G5622 Lesion of ulnar nerve, left upper limb: Secondary | ICD-10-CM | POA: Diagnosis not present

## 2019-06-21 DIAGNOSIS — R9389 Abnormal findings on diagnostic imaging of other specified body structures: Secondary | ICD-10-CM | POA: Diagnosis not present

## 2019-06-21 DIAGNOSIS — M25522 Pain in left elbow: Secondary | ICD-10-CM | POA: Diagnosis not present

## 2019-06-21 DIAGNOSIS — M19022 Primary osteoarthritis, left elbow: Secondary | ICD-10-CM | POA: Insufficient documentation

## 2019-08-02 DIAGNOSIS — M5416 Radiculopathy, lumbar region: Secondary | ICD-10-CM | POA: Diagnosis not present

## 2019-08-02 DIAGNOSIS — M47816 Spondylosis without myelopathy or radiculopathy, lumbar region: Secondary | ICD-10-CM | POA: Diagnosis not present

## 2019-08-02 DIAGNOSIS — M5489 Other dorsalgia: Secondary | ICD-10-CM | POA: Diagnosis not present

## 2019-08-02 DIAGNOSIS — M545 Low back pain: Secondary | ICD-10-CM | POA: Diagnosis not present

## 2019-08-17 DIAGNOSIS — E1165 Type 2 diabetes mellitus with hyperglycemia: Secondary | ICD-10-CM | POA: Diagnosis not present

## 2019-08-17 DIAGNOSIS — M25522 Pain in left elbow: Secondary | ICD-10-CM | POA: Diagnosis not present

## 2019-08-17 DIAGNOSIS — N528 Other male erectile dysfunction: Secondary | ICD-10-CM | POA: Diagnosis not present

## 2019-11-18 ENCOUNTER — Other Ambulatory Visit: Payer: Self-pay

## 2019-11-18 ENCOUNTER — Other Ambulatory Visit: Payer: PPO

## 2019-11-18 DIAGNOSIS — Z20822 Contact with and (suspected) exposure to covid-19: Secondary | ICD-10-CM

## 2019-11-19 DIAGNOSIS — M960 Pseudarthrosis after fusion or arthrodesis: Secondary | ICD-10-CM | POA: Diagnosis not present

## 2019-11-19 DIAGNOSIS — Z969 Presence of functional implant, unspecified: Secondary | ICD-10-CM | POA: Diagnosis not present

## 2019-11-20 LAB — SARS-COV-2, NAA 2 DAY TAT

## 2019-11-20 LAB — NOVEL CORONAVIRUS, NAA: SARS-CoV-2, NAA: NOT DETECTED

## 2020-02-07 ENCOUNTER — Encounter (INDEPENDENT_AMBULATORY_CARE_PROVIDER_SITE_OTHER): Payer: Self-pay | Admitting: *Deleted

## 2020-02-07 DIAGNOSIS — Z136 Encounter for screening for cardiovascular disorders: Secondary | ICD-10-CM | POA: Diagnosis not present

## 2020-02-07 DIAGNOSIS — Z0000 Encounter for general adult medical examination without abnormal findings: Secondary | ICD-10-CM | POA: Diagnosis not present

## 2020-02-07 DIAGNOSIS — Z125 Encounter for screening for malignant neoplasm of prostate: Secondary | ICD-10-CM | POA: Diagnosis not present

## 2020-02-07 DIAGNOSIS — Z1211 Encounter for screening for malignant neoplasm of colon: Secondary | ICD-10-CM | POA: Diagnosis not present

## 2020-02-07 DIAGNOSIS — M25522 Pain in left elbow: Secondary | ICD-10-CM | POA: Diagnosis not present

## 2020-02-07 DIAGNOSIS — E7800 Pure hypercholesterolemia, unspecified: Secondary | ICD-10-CM | POA: Diagnosis not present

## 2020-02-07 DIAGNOSIS — Z13220 Encounter for screening for lipoid disorders: Secondary | ICD-10-CM | POA: Diagnosis not present

## 2020-02-07 DIAGNOSIS — I10 Essential (primary) hypertension: Secondary | ICD-10-CM | POA: Diagnosis not present

## 2020-02-07 DIAGNOSIS — E1165 Type 2 diabetes mellitus with hyperglycemia: Secondary | ICD-10-CM | POA: Diagnosis not present

## 2020-02-07 DIAGNOSIS — Z23 Encounter for immunization: Secondary | ICD-10-CM | POA: Diagnosis not present

## 2020-02-09 ENCOUNTER — Encounter (INDEPENDENT_AMBULATORY_CARE_PROVIDER_SITE_OTHER): Payer: Self-pay | Admitting: *Deleted

## 2020-02-22 ENCOUNTER — Encounter (INDEPENDENT_AMBULATORY_CARE_PROVIDER_SITE_OTHER): Payer: Self-pay

## 2020-02-22 ENCOUNTER — Telehealth (INDEPENDENT_AMBULATORY_CARE_PROVIDER_SITE_OTHER): Payer: Self-pay

## 2020-02-22 ENCOUNTER — Other Ambulatory Visit (INDEPENDENT_AMBULATORY_CARE_PROVIDER_SITE_OTHER): Payer: Self-pay

## 2020-02-22 DIAGNOSIS — Z1211 Encounter for screening for malignant neoplasm of colon: Secondary | ICD-10-CM

## 2020-02-22 DIAGNOSIS — M62830 Muscle spasm of back: Secondary | ICD-10-CM | POA: Diagnosis not present

## 2020-02-22 MED ORDER — NA SULFATE-K SULFATE-MG SULF 17.5-3.13-1.6 GM/177ML PO SOLN: 354.0000 mL | Freq: Once | ORAL | 0 refills | Status: AC

## 2020-02-22 NOTE — Telephone Encounter (Signed)
Referring MD/PCP: Huel Cote   Procedure: tcs  Reason/Indication:  screening  Has patient had this procedure before?  no  If so, when, by whom and where?    Is there a family history of colon cancer?  no  Who?  What age when diagnosed?    Is patient diabetic?   yes      Does patient have prosthetic heart valve or mechanical valve?  no  Do you have a pacemaker/defibrillator?  no  Has patient ever had endocarditis/atrial fibrillation? no  Have you had a stroke/heart attack last 6 mths? no  Does patient use oxygen? no  Has patient had joint replacement within last 12 months?  no  Is patient constipated or do they take laxatives? no  Does patient have a history of alcohol/drug use?  non  Is patient on blood thinner such as Coumadin, Plavix and/or Aspirin? no  Medications: celecoxib 100 mg daily, citalopram 20 mg daily, glipizide 5 mg daily, hctz 12.5 mg daily, lisinopril 20 mg daily, metformin 500 mg daily, oxycodone 5 mg prn, pravastatin 40 mg daily  Allergies: codeine  Medication Adjustment per Dr Rehman/Dr Jenetta Downer: Metoformin the eveing prior and morning of procedure  Procedure date & time: 03/16/20 1:15

## 2020-02-22 NOTE — Telephone Encounter (Signed)
LeighAnn Reily Treloar, CMA  

## 2020-03-01 ENCOUNTER — Telehealth (INDEPENDENT_AMBULATORY_CARE_PROVIDER_SITE_OTHER): Payer: Self-pay

## 2020-03-01 ENCOUNTER — Telehealth (INDEPENDENT_AMBULATORY_CARE_PROVIDER_SITE_OTHER): Payer: Self-pay | Admitting: Internal Medicine

## 2020-03-01 DIAGNOSIS — Z1211 Encounter for screening for malignant neoplasm of colon: Secondary | ICD-10-CM

## 2020-03-01 MED ORDER — PEG 3350-KCL-NA BICARB-NACL 420 G PO SOLR: 4000.0000 mL | ORAL | 0 refills | Status: DC

## 2020-03-01 NOTE — Telephone Encounter (Signed)
LeighAnn Saylor Sheckler, CMA  

## 2020-03-01 NOTE — Telephone Encounter (Signed)
Spouse called states patient needs to talk to you regarding his colonoscopy prep - ph# [phone]

## 2020-03-01 NOTE — Telephone Encounter (Signed)
I called and spoke to Mr Nikolai and answered his questions regarding his bowel prep

## 2020-03-09 DIAGNOSIS — Z23 Encounter for immunization: Secondary | ICD-10-CM | POA: Diagnosis not present

## 2020-03-15 ENCOUNTER — Other Ambulatory Visit (HOSPITAL_COMMUNITY): Payer: PPO

## 2020-03-16 ENCOUNTER — Ambulatory Visit (HOSPITAL_COMMUNITY): Admit: 2020-03-16 | Payer: PPO | Admitting: Internal Medicine

## 2020-03-16 ENCOUNTER — Encounter (HOSPITAL_COMMUNITY): Payer: Self-pay

## 2020-03-16 SURGERY — COLONOSCOPY
Anesthesia: Moderate Sedation

## 2020-04-06 ENCOUNTER — Other Ambulatory Visit: Payer: Self-pay

## 2020-04-06 ENCOUNTER — Other Ambulatory Visit: Payer: PPO

## 2020-04-06 DIAGNOSIS — Z20822 Contact with and (suspected) exposure to covid-19: Secondary | ICD-10-CM

## 2020-04-07 LAB — NOVEL CORONAVIRUS, NAA: SARS-CoV-2, NAA: NOT DETECTED

## 2020-04-07 LAB — SARS-COV-2, NAA 2 DAY TAT

## 2020-04-19 DIAGNOSIS — M47816 Spondylosis without myelopathy or radiculopathy, lumbar region: Secondary | ICD-10-CM | POA: Diagnosis not present

## 2020-04-19 DIAGNOSIS — M5416 Radiculopathy, lumbar region: Secondary | ICD-10-CM | POA: Diagnosis not present

## 2020-04-19 DIAGNOSIS — M545 Low back pain, unspecified: Secondary | ICD-10-CM | POA: Diagnosis not present

## 2020-06-28 DIAGNOSIS — M25562 Pain in left knee: Secondary | ICD-10-CM | POA: Diagnosis not present

## 2020-06-28 DIAGNOSIS — M25561 Pain in right knee: Secondary | ICD-10-CM | POA: Diagnosis not present

## 2020-08-08 DIAGNOSIS — E1165 Type 2 diabetes mellitus with hyperglycemia: Secondary | ICD-10-CM | POA: Diagnosis not present

## 2020-08-08 DIAGNOSIS — M778 Other enthesopathies, not elsewhere classified: Secondary | ICD-10-CM | POA: Diagnosis not present

## 2020-10-02 DIAGNOSIS — L821 Other seborrheic keratosis: Secondary | ICD-10-CM | POA: Diagnosis not present

## 2020-10-02 DIAGNOSIS — C4359 Malignant melanoma of other part of trunk: Secondary | ICD-10-CM | POA: Diagnosis not present

## 2020-10-18 DIAGNOSIS — M545 Low back pain, unspecified: Secondary | ICD-10-CM | POA: Diagnosis not present

## 2020-10-18 DIAGNOSIS — I10 Essential (primary) hypertension: Secondary | ICD-10-CM | POA: Diagnosis not present

## 2020-10-18 DIAGNOSIS — M25561 Pain in right knee: Secondary | ICD-10-CM | POA: Diagnosis not present

## 2020-10-18 DIAGNOSIS — M25562 Pain in left knee: Secondary | ICD-10-CM | POA: Diagnosis not present

## 2020-10-18 DIAGNOSIS — M5416 Radiculopathy, lumbar region: Secondary | ICD-10-CM | POA: Diagnosis not present

## 2020-10-18 DIAGNOSIS — Z6841 Body Mass Index (BMI) 40.0 and over, adult: Secondary | ICD-10-CM | POA: Diagnosis not present

## 2020-10-31 DIAGNOSIS — C439 Malignant melanoma of skin, unspecified: Secondary | ICD-10-CM | POA: Diagnosis not present

## 2020-11-20 IMAGING — DX DG FOOT COMPLETE 3+V*R*
3 series · 3 of 3 positions shown · non-contrast
Comparison: 05/17/2016

CLINICAL DATA: Right foot arthrodesis, pes planus

EXAM:
RIGHT FOOT COMPLETE - 3+ VIEW

[foot ap]
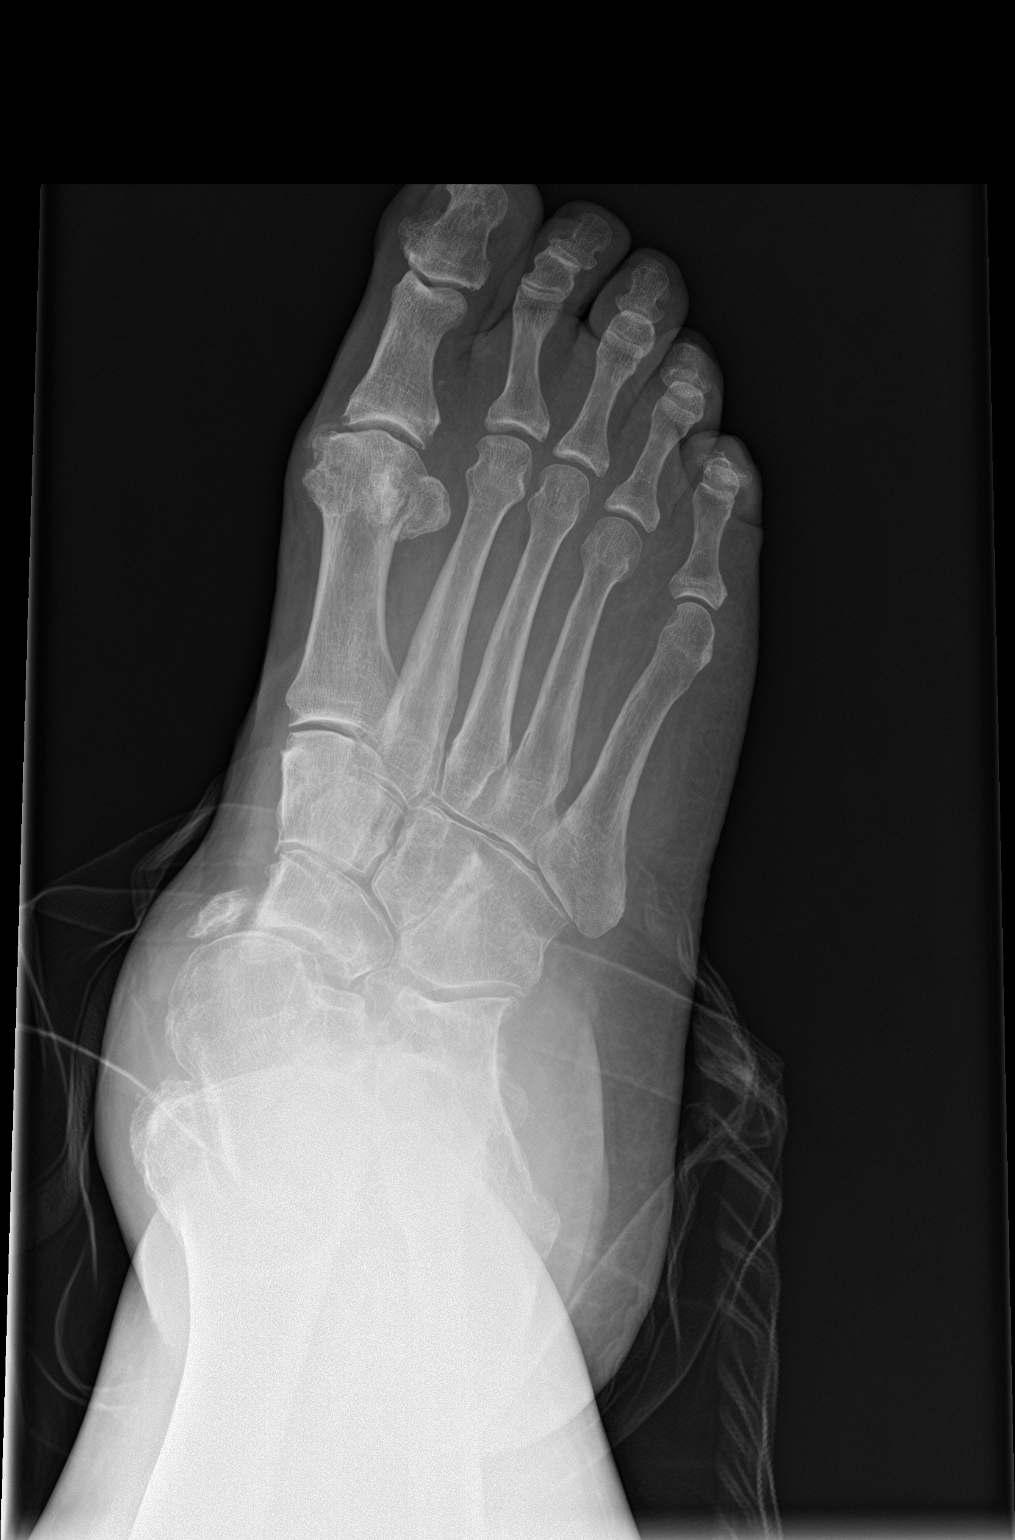

[foot obl]
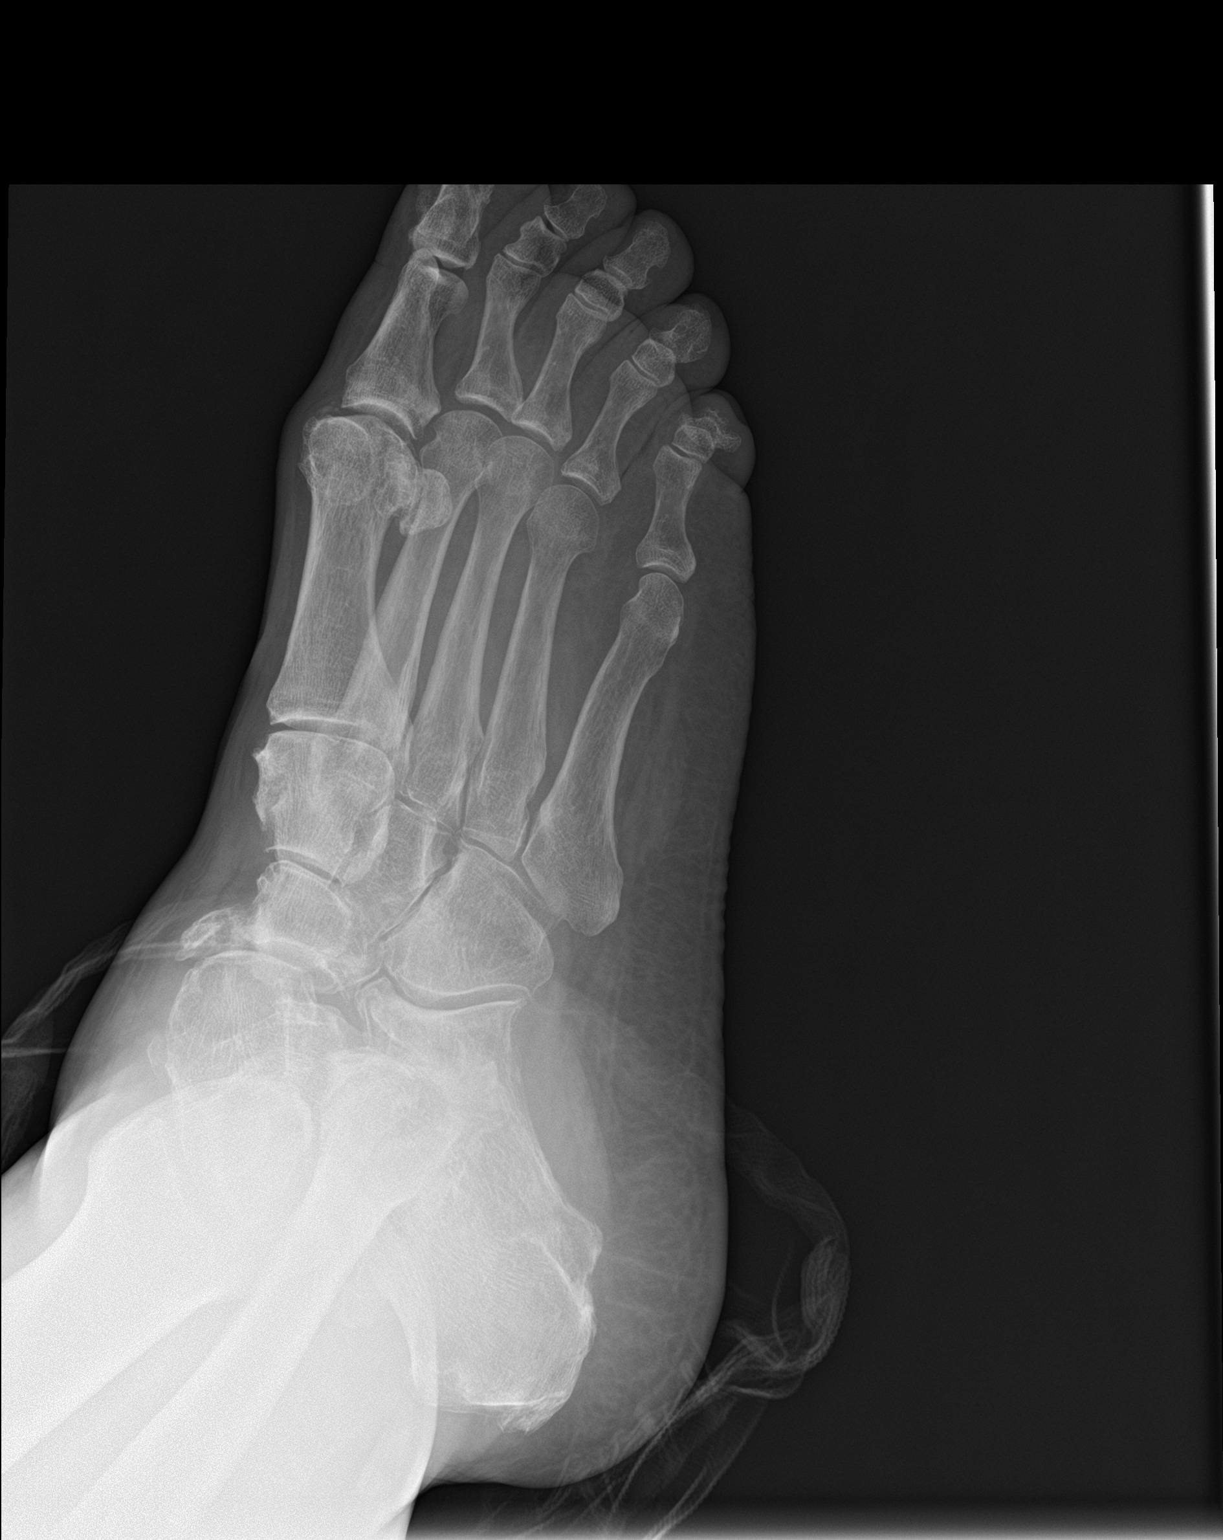

[foot lat]
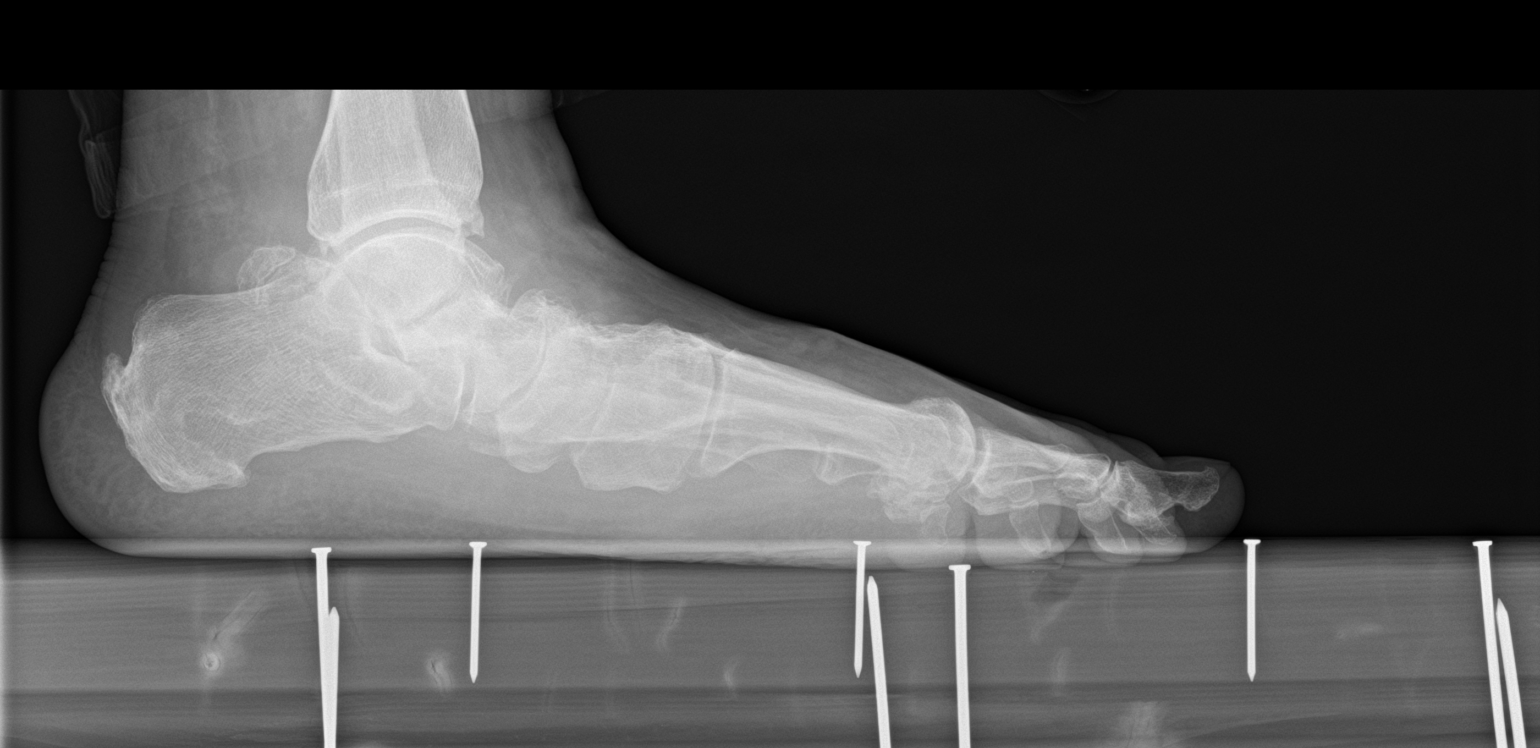

[3 of 3 positions shown; findings below may reference images not displayed]

FINDINGS: No acute fracture or dislocation. No aggressive osseous lesion.
Relative pes planus. Moderate osteoarthritis of the talonavicular
joint. Mild osteoarthritis of the navicular-medial cuneiform joint.
Mild osteoarthritis of the calcaneocuboid joint. Moderate-severe
osteoarthritis of the subtalar joints. Moderate osteoarthritis of
first MTP joint. Enthesopathic changes of the Achilles tendon
insertion. Small plantar calcaneal spur.
IMPRESSION: Osteoarthritis of the talonavicular joint, subtalar joints and
calcaneocuboid joint as described above.

## 2020-11-25 IMAGING — CR DG FOOT COMPLETE 3+V*R*
1 series · 3 of 3 positions shown · non-contrast
Comparison: RIGHT foot radiograph February 13, 2018

CLINICAL DATA: Postoperative evaluation.

EXAM:
RIGHT FOOT COMPLETE - 3+ VIEW

[Series 1: ap · 0.17mm/px · 3 of 3 slices shown]
[im 1/3]
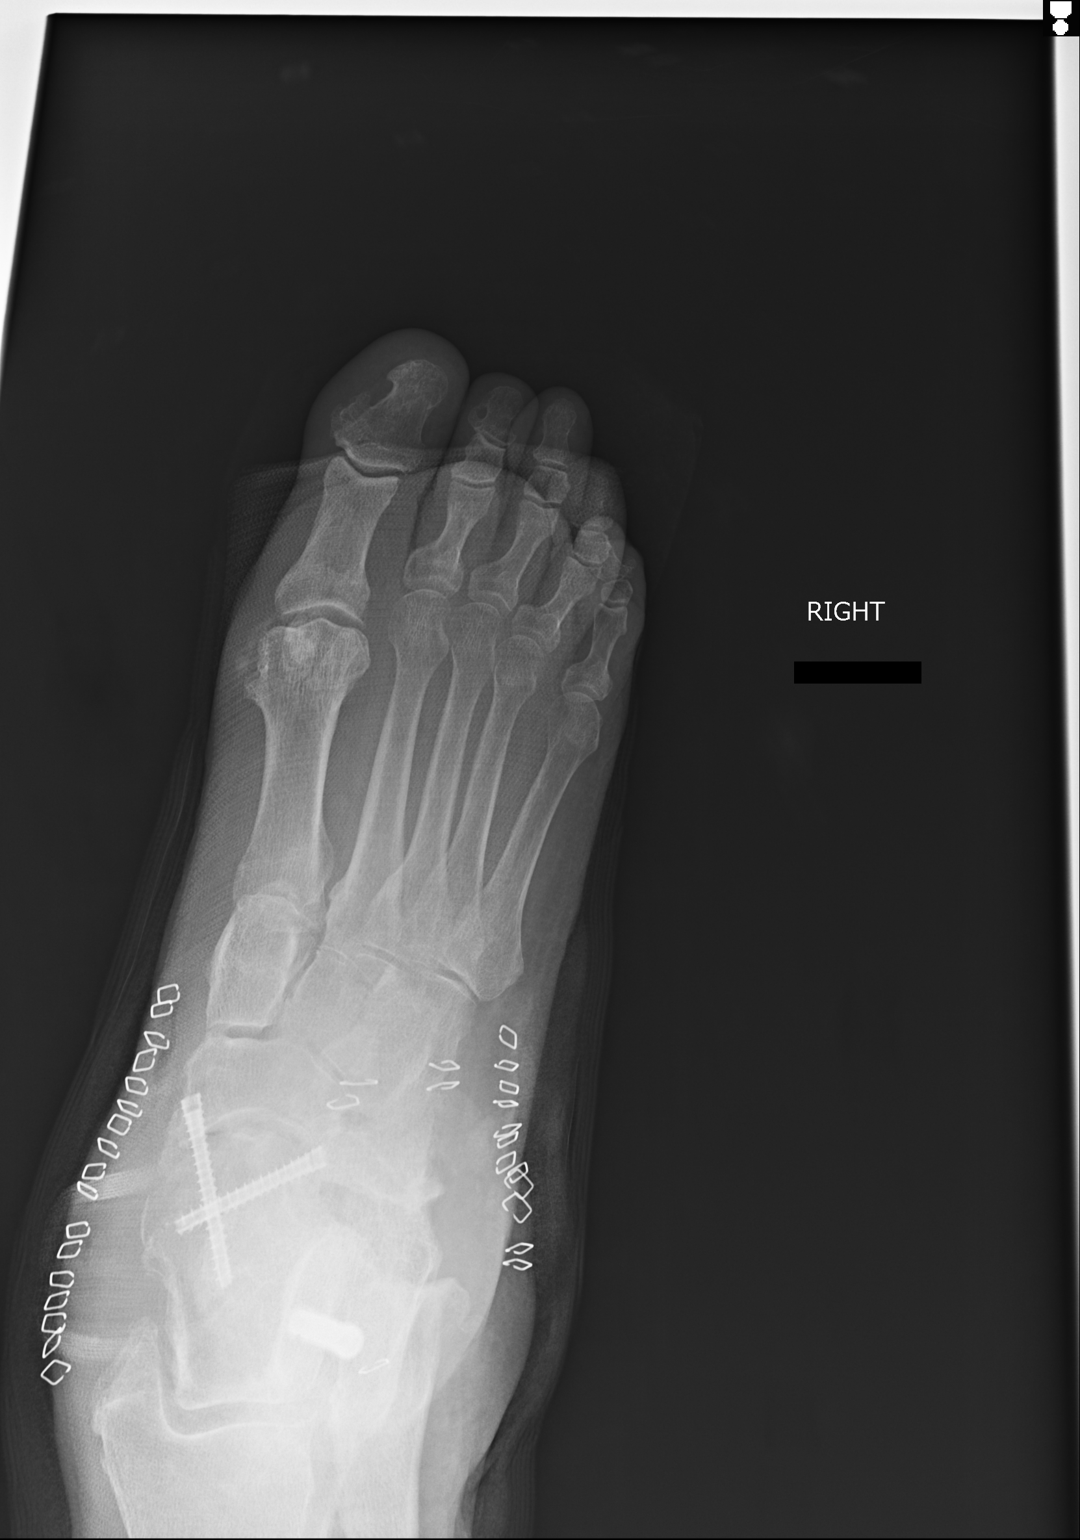
[im 2/3]
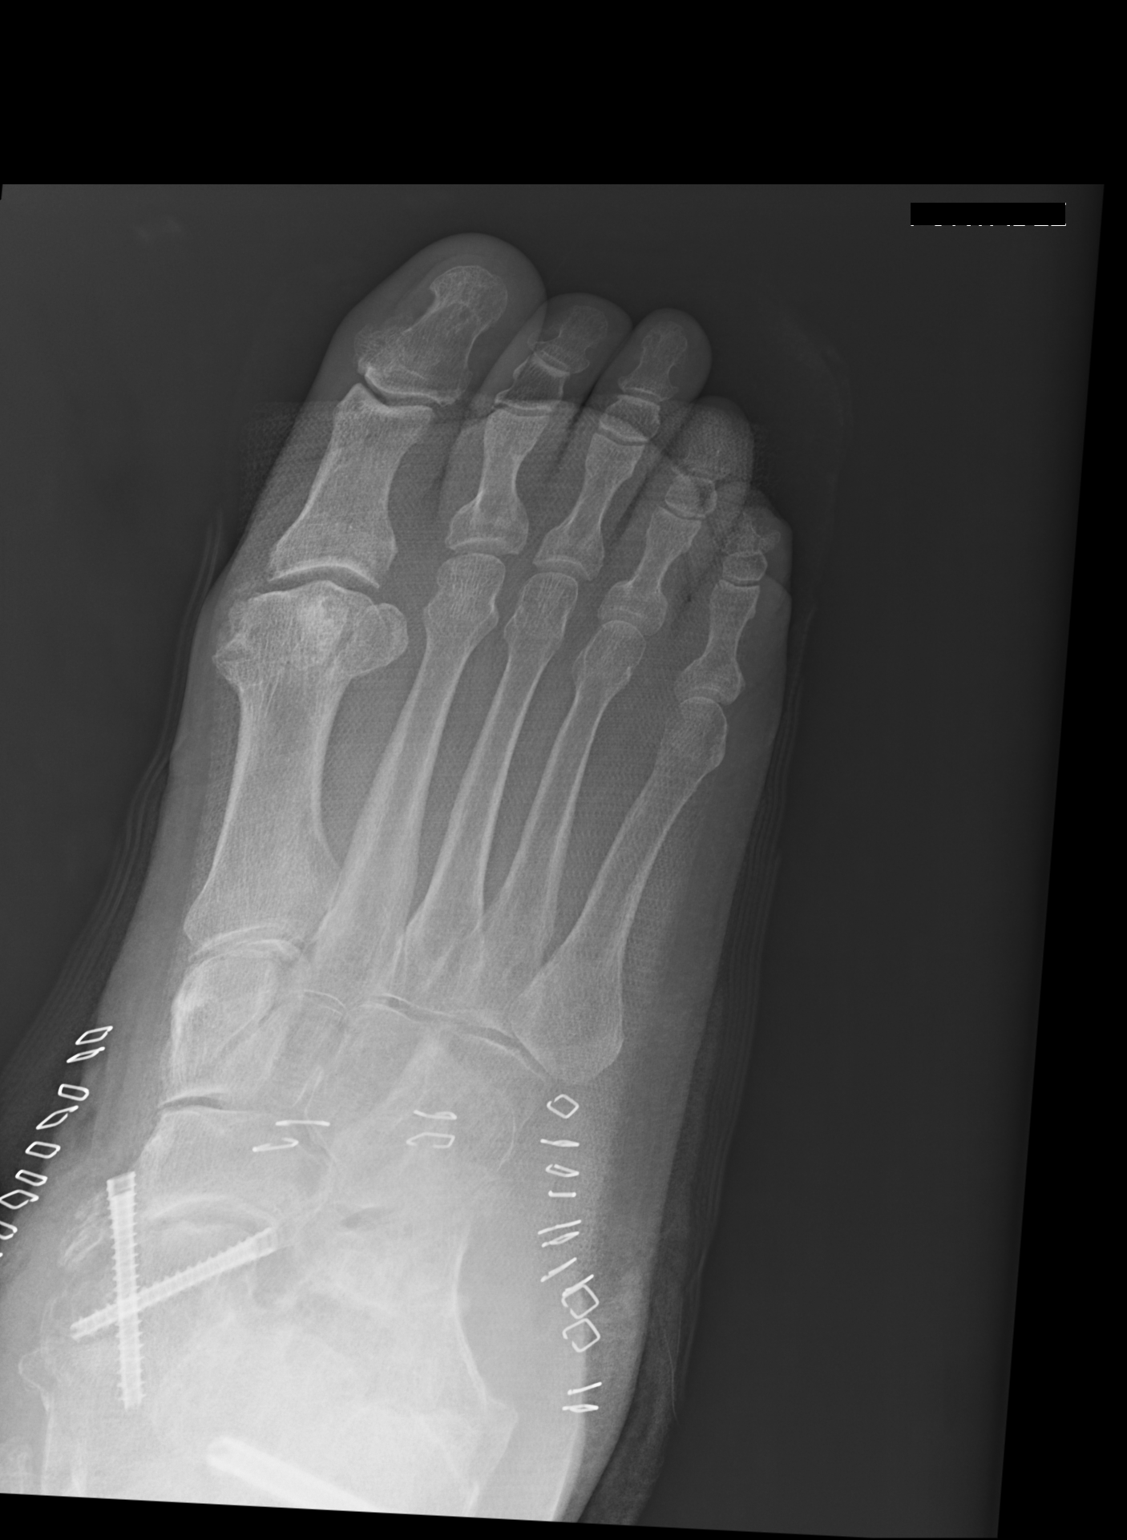
[im 3/3]
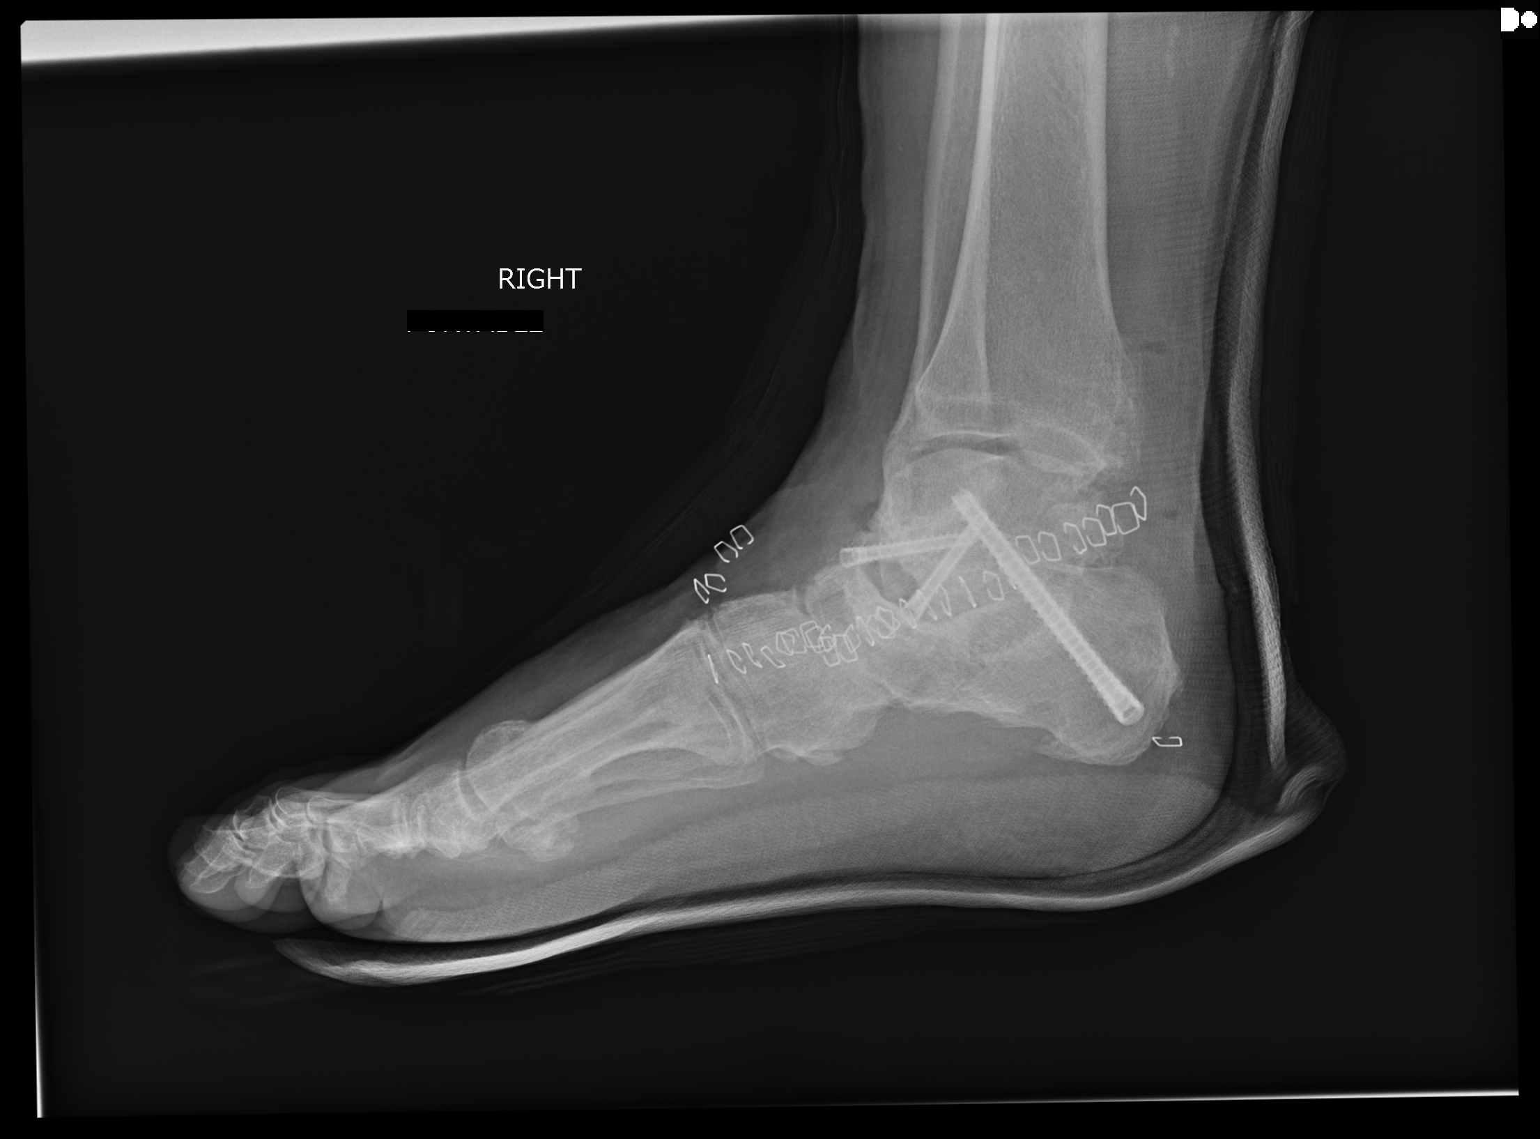

[3 of 3 positions shown; findings below may reference images not displayed]

FINDINGS: Three cannulated and threaded screws transfixing calcaneus to the
navicular bone with interval partial osteotomy. Possible
talonavicular transfixing screw. Advanced first MTP osteoarthrosis,
not tailored for evaluation. No dislocation. No destructive bony
lesions. Soft tissue swelling with subcutaneous gas and skin staples
consistent with recent surgery. Fiberglass splint in place.
IMPRESSION: 1. Interval hindfoot fusion.

## 2020-12-05 DIAGNOSIS — D485 Neoplasm of uncertain behavior of skin: Secondary | ICD-10-CM | POA: Diagnosis not present

## 2020-12-06 DIAGNOSIS — C439 Malignant melanoma of skin, unspecified: Secondary | ICD-10-CM | POA: Diagnosis not present

## 2020-12-06 DIAGNOSIS — L905 Scar conditions and fibrosis of skin: Secondary | ICD-10-CM | POA: Diagnosis not present

## 2020-12-06 DIAGNOSIS — C4359 Malignant melanoma of other part of trunk: Secondary | ICD-10-CM | POA: Diagnosis not present

## 2020-12-26 DIAGNOSIS — C4359 Malignant melanoma of other part of trunk: Secondary | ICD-10-CM | POA: Diagnosis not present

## 2021-01-08 DIAGNOSIS — L820 Inflamed seborrheic keratosis: Secondary | ICD-10-CM | POA: Diagnosis not present

## 2021-01-08 DIAGNOSIS — Z08 Encounter for follow-up examination after completed treatment for malignant neoplasm: Secondary | ICD-10-CM | POA: Diagnosis not present

## 2021-01-08 DIAGNOSIS — Z1283 Encounter for screening for malignant neoplasm of skin: Secondary | ICD-10-CM | POA: Diagnosis not present

## 2021-01-08 DIAGNOSIS — Z85820 Personal history of malignant melanoma of skin: Secondary | ICD-10-CM | POA: Diagnosis not present

## 2021-01-08 DIAGNOSIS — L821 Other seborrheic keratosis: Secondary | ICD-10-CM | POA: Diagnosis not present

## 2021-01-08 DIAGNOSIS — D225 Melanocytic nevi of trunk: Secondary | ICD-10-CM | POA: Diagnosis not present

## 2021-02-07 ENCOUNTER — Encounter (INDEPENDENT_AMBULATORY_CARE_PROVIDER_SITE_OTHER): Payer: Self-pay | Admitting: Internal Medicine

## 2021-03-22 ENCOUNTER — Telehealth (INDEPENDENT_AMBULATORY_CARE_PROVIDER_SITE_OTHER): Payer: Self-pay | Admitting: Gastroenterology

## 2021-03-22 NOTE — Telephone Encounter (Signed)
Patients wife called the office stated he has been waiting several months to get his colonoscopy scheduled - states patient has turned his paperwork in - please advise - ph# [phone]

## 2021-03-26 ENCOUNTER — Telehealth: Payer: Self-pay | Admitting: *Deleted

## 2021-03-26 NOTE — Telephone Encounter (Signed)
Called about setting TCS   Please call back

## 2021-03-27 ENCOUNTER — Telehealth (INDEPENDENT_AMBULATORY_CARE_PROVIDER_SITE_OTHER): Payer: Self-pay | Admitting: *Deleted

## 2021-03-27 ENCOUNTER — Encounter (INDEPENDENT_AMBULATORY_CARE_PROVIDER_SITE_OTHER): Payer: Self-pay | Admitting: *Deleted

## 2021-03-27 MED ORDER — PEG 3350-KCL-NA BICARB-NACL 420 G PO SOLR: 4000.0000 mL | Freq: Once | ORAL | 0 refills | Status: AC

## 2021-03-27 NOTE — Telephone Encounter (Signed)
Referring MD/PCP: rucker  Procedure: tcs  Reason/Indication:  screening  Has patient had this procedure before?  Yes, 10 yrs ago  If so, when, by whom and where?    Is there a family history of colon cancer?  no  Who?  What age when diagnosed?    Is patient diabetic? If yes, Type 1 or Type 2   yes, type 2      Does patient have prosthetic heart valve or mechanical valve?  no  Do you have a pacemaker/defibrillator?  no  Has patient ever had endocarditis/atrial fibrillation? no  Does patient use oxygen? no  Has patient had joint replacement within last 12 months?  no  Is patient constipated or do they take laxatives? no  Does patient have a history of alcohol/drug use?  no  Have you had a stroke/heart attack last 6 mths? no  Do you take medicine for weight loss?  no  For male patients,: have you had a hysterectomy or are you post menopausal and do you still have your menstrual cycle? N/a  Is patient on blood thinner such as Coumadin, Plavix and/or Aspirin? no  Medications: celecoxib 100 mg daily, citalopram 20 mg daily, glipizide 5 mg daily, hctz 12.5 mg daily, lisinopril 20 mg daily, metformin 200 mg daily, oxycodone 5 mg prn, pravastatin 40 mg daily  Allergies: codeine  Medication Adjustment per Dr Rehman/Dr Jenetta Downer hold diabetic meds evening before and morning of procedure  Procedure date & time: 05/02/21 .

## 2021-03-27 NOTE — Telephone Encounter (Signed)
Patient needs trilyte 

## 2021-03-27 NOTE — Telephone Encounter (Signed)
Error   This encounter was created in error - please disregard. 

## 2021-03-27 NOTE — Telephone Encounter (Signed)
Medication sent.

## 2021-03-27 NOTE — Telephone Encounter (Signed)
Spoke to patient, TCS sch'd 05/02/21, instructions mailed to patient

## 2021-03-30 ENCOUNTER — Other Ambulatory Visit (INDEPENDENT_AMBULATORY_CARE_PROVIDER_SITE_OTHER): Payer: Self-pay

## 2021-03-30 DIAGNOSIS — Z1211 Encounter for screening for malignant neoplasm of colon: Secondary | ICD-10-CM

## 2021-04-25 NOTE — Patient Instructions (Signed)
ATHA MCBAIN  04/25/2021     @PREFPERIOPPHARMACY @   Your procedure is scheduled on  05/02/2021.   Report to [facility] at  0730  A.M.   Call this number if you have problems the morning of surgery:  [phone]   Remember:  Follow the diet and prep instructions given to you by the office.    DO NOT take any medications for diabetes the morning of your procedure.    Take these medicines the morning of surgery with A SIP OF WATER                              celexa, oxycodone(if needed).     Do not wear jewelry, make-up or nail polish.  Do not wear lotions, powders, or perfumes, or deodorant.  Do not shave 48 hours prior to surgery.  Men may shave face and neck.  Do not bring valuables to the hospital.  [facility] is not responsible for any belongings or valuables.  Contacts, dentures or bridgework may not be worn into surgery.  Leave your suitcase in the car.  After surgery it may be brought to your room.  For patients admitted to the hospital, discharge time will be determined by your treatment team.  Patients discharged the day of surgery will not be allowed to drive home and must have someone with them for 24 hours.    Special instructions:   DO NOT smoke tobacco or vape for 24 hours before your procedure.  Please read over the following fact sheets that you were given. Anesthesia Post-op Instructions and Care and Recovery After Surgery      Colonoscopy, Adult, Care After This sheet gives you information about how to care for yourself after your procedure. Your health care provider may also give you more specific instructions. If you have problems or questions, contact your health care provider. What can I expect after the procedure? After the procedure, it is common to have: A small amount of blood in your stool for 24 hours after the procedure. Some gas. Mild cramping or bloating of your abdomen. Follow these instructions at home: Eating and  drinking  Drink enough fluid to keep your urine pale yellow. Follow instructions from your health care provider about eating or drinking restrictions. Resume your normal diet as instructed by your health care provider. Avoid heavy or fried foods that are hard to digest. Activity Rest as told by your health care provider. Avoid sitting for a long time without moving. Get up to take short walks every 1-2 hours. This is important to improve blood flow and breathing. Ask for help if you feel weak or unsteady. Return to your normal activities as told by your health care provider. Ask your health care provider what activities are safe for you. Managing cramping and bloating  Try walking around when you have cramps or feel bloated. Apply heat to your abdomen as told by your health care provider. Use the heat source that your health care provider recommends, such as a moist heat pack or a heating pad. Place a towel between your skin and the heat source. Leave the heat on for 20-30 minutes. Remove the heat if your skin turns bright red. This is especially important if you are unable to feel pain, heat, or cold. You may have a greater risk of getting burned. General instructions If you were given a sedative during  the procedure, it can affect you for several hours. Do not drive or operate machinery until your health care provider says that it is safe. For the first 24 hours after the procedure: Do not sign important documents. Do not drink alcohol. Do your regular daily activities at a slower pace than normal. Eat soft foods that are easy to digest. Take over-the-counter and prescription medicines only as told by your health care provider. Keep all follow-up visits as told by your health care provider. This is important. Contact a health care provider if: You have blood in your stool 2-3 days after the procedure. Get help right away if you have: More than a small spotting of blood in your  stool. Large blood clots in your stool. Swelling of your abdomen. Nausea or vomiting. A fever. Increasing pain in your abdomen that is not relieved with medicine. Summary After the procedure, it is common to have a small amount of blood in your stool. You may also have mild cramping and bloating of your abdomen. If you were given a sedative during the procedure, it can affect you for several hours. Do not drive or operate machinery until your health care provider says that it is safe. Get help right away if you have a lot of blood in your stool, nausea or vomiting, a fever, or increased pain in your abdomen. This information is not intended to replace advice given to you by your health care provider. Make sure you discuss any questions you have with your health care provider. Document Revised: 01/01/2019 Document Reviewed: 09/21/2018 Elsevier Patient Education  [place] After This sheet gives you information about how to care for yourself after your procedure. Your health care provider may also give you more specific instructions. If you have problems or questions, contact your health care provider. What can I expect after the procedure? After the procedure, it is common to have: Tiredness. Forgetfulness about what happened after the procedure. Impaired judgment for important decisions. Nausea or vomiting. Some difficulty with balance. Follow these instructions at home: For the time period you were told by your health care provider:   Rest as needed. Do not participate in activities where you could fall or become injured. Do not drive or use machinery. Do not drink alcohol. Do not take sleeping pills or medicines that cause drowsiness. Do not make important decisions or sign legal documents. Do not take care of children on your own. Eating and drinking Follow the diet that is recommended by your health care provider. Drink enough fluid to  keep your urine pale yellow. If you vomit: Drink water, juice, or soup when you can drink without vomiting. Make sure you have little or no nausea before eating solid foods. General instructions Have a responsible adult stay with you for the time you are told. It is important to have someone help care for you until you are awake and alert. Take over-the-counter and prescription medicines only as told by your health care provider. If you have sleep apnea, surgery and certain medicines can increase your risk for breathing problems. Follow instructions from your health care provider about wearing your sleep device: Anytime you are sleeping, including during daytime naps. While taking prescription pain medicines, sleeping medicines, or medicines that make you drowsy. Avoid smoking. Keep all follow-up visits as told by your health care provider. This is important. Contact a health care provider if: You keep feeling nauseous or you keep vomiting. You feel light-headed.  You are still sleepy or having trouble with balance after 24 hours. You develop a rash. You have a fever. You have redness or swelling around the IV site. Get help right away if: You have trouble breathing. You have new-onset confusion at home. Summary For several hours after your procedure, you may feel tired. You may also be forgetful and have poor judgment. Have a responsible adult stay with you for the time you are told. It is important to have someone help care for you until you are awake and alert. Rest as told. Do not drive or operate machinery. Do not drink alcohol or take sleeping pills. Get help right away if you have trouble breathing, or if you suddenly become confused. This information is not intended to replace advice given to you by your health care provider. Make sure you discuss any questions you have with your health care provider. Document Revised: 11/11/2019 Document Reviewed: 01/28/2019 Elsevier Patient  Education  2022 [employer].

## 2021-04-26 ENCOUNTER — Other Ambulatory Visit: Payer: Self-pay

## 2021-04-26 ENCOUNTER — Encounter (HOSPITAL_COMMUNITY): Payer: Self-pay

## 2021-04-26 ENCOUNTER — Encounter (HOSPITAL_COMMUNITY)
Admission: RE | Admit: 2021-04-26 | Discharge: 2021-04-26 | Disposition: A | Discharge status: Home / Self Care | Payer: PPO | Source: Ambulatory Visit | Attending: Internal Medicine | Admitting: Internal Medicine

## 2021-04-26 DIAGNOSIS — Z01818 Encounter for other preprocedural examination: Secondary | ICD-10-CM | POA: Insufficient documentation

## 2021-04-26 DIAGNOSIS — Z1211 Encounter for screening for malignant neoplasm of colon: Secondary | ICD-10-CM | POA: Diagnosis not present

## 2021-04-26 LAB — BASIC METABOLIC PANEL
Anion gap: 11 (ref 5–15)
BUN: 19 mg/dL (ref 8–23)
CO2: 24 mmol/L (ref 22–32)
Calcium: 9.4 mg/dL (ref 8.9–10.3)
Chloride: 99 mmol/L (ref 98–111)
Creatinine, Ser: 0.83 mg/dL (ref 0.61–1.24)
GFR, Estimated: >60 mL/min (ref >60)
Glucose, Bld: 141 mg/dL — ABNORMAL HIGH (ref 70–99)
Potassium: 4 mmol/L (ref 3.5–5.1)
Sodium: 134 mmol/L — ABNORMAL LOW (ref 135–145)

## 2021-05-02 ENCOUNTER — Ambulatory Visit (HOSPITAL_BASED_OUTPATIENT_CLINIC_OR_DEPARTMENT_OTHER): Payer: PPO | Admitting: Anesthesiology

## 2021-05-02 ENCOUNTER — Encounter (HOSPITAL_COMMUNITY)
Admission: RE | Disposition: A | Discharge status: Home / Self Care | Payer: Self-pay | Source: Home / Self Care | Attending: Internal Medicine

## 2021-05-02 ENCOUNTER — Ambulatory Visit (HOSPITAL_COMMUNITY): Payer: PPO | Admitting: Anesthesiology

## 2021-05-02 ENCOUNTER — Other Ambulatory Visit: Payer: Self-pay

## 2021-05-02 ENCOUNTER — Encounter (HOSPITAL_COMMUNITY): Payer: Self-pay | Admitting: Internal Medicine

## 2021-05-02 ENCOUNTER — Encounter (INDEPENDENT_AMBULATORY_CARE_PROVIDER_SITE_OTHER): Payer: Self-pay | Admitting: *Deleted

## 2021-05-02 ENCOUNTER — Ambulatory Visit (HOSPITAL_COMMUNITY)
Admission: RE | Admit: 2021-05-02 | Discharge: 2021-05-02 | Disposition: A | Discharge status: Home / Self Care | Payer: PPO | Attending: Internal Medicine | Admitting: Internal Medicine

## 2021-05-02 DIAGNOSIS — Z6841 Body Mass Index (BMI) 40.0 and over, adult: Secondary | ICD-10-CM | POA: Insufficient documentation

## 2021-05-02 DIAGNOSIS — I10 Essential (primary) hypertension: Secondary | ICD-10-CM | POA: Insufficient documentation

## 2021-05-02 DIAGNOSIS — K5730 Diverticulosis of large intestine without perforation or abscess without bleeding: Secondary | ICD-10-CM | POA: Insufficient documentation

## 2021-05-02 DIAGNOSIS — E119 Type 2 diabetes mellitus without complications: Secondary | ICD-10-CM | POA: Insufficient documentation

## 2021-05-02 DIAGNOSIS — K648 Other hemorrhoids: Secondary | ICD-10-CM | POA: Insufficient documentation

## 2021-05-02 DIAGNOSIS — Z1211 Encounter for screening for malignant neoplasm of colon: Secondary | ICD-10-CM

## 2021-05-02 DIAGNOSIS — K635 Polyp of colon: Secondary | ICD-10-CM

## 2021-05-02 DIAGNOSIS — F32A Depression, unspecified: Secondary | ICD-10-CM | POA: Insufficient documentation

## 2021-05-02 DIAGNOSIS — D125 Benign neoplasm of sigmoid colon: Secondary | ICD-10-CM | POA: Diagnosis not present

## 2021-05-02 DIAGNOSIS — K644 Residual hemorrhoidal skin tags: Secondary | ICD-10-CM | POA: Insufficient documentation

## 2021-05-02 HISTORY — PX: COLONOSCOPY WITH PROPOFOL: SHX5780

## 2021-05-02 HISTORY — PX: POLYPECTOMY: SHX149

## 2021-05-02 LAB — HM COLONOSCOPY

## 2021-05-02 LAB — GLUCOSE, CAPILLARY: Glucose-Capillary: 196 mg/dL — ABNORMAL HIGH (ref 70–99)

## 2021-05-02 SURGERY — COLONOSCOPY WITH PROPOFOL
Anesthesia: General

## 2021-05-02 MED ORDER — PROPOFOL 500 MG/50ML IV EMUL
INTRAVENOUS | Status: DC | PRN
  Administered 2021-05-02: 100 ug/kg/min via INTRAVENOUS

## 2021-05-02 MED ORDER — LACTATED RINGERS IV SOLN: INTRAVENOUS | Status: DC

## 2021-05-02 MED ORDER — PROPOFOL 10 MG/ML IV BOLUS
INTRAVENOUS | Status: DC | PRN
  Administered 2021-05-02: 100 mg via INTRAVENOUS
  Administered 2021-05-02 (×2): 30 mg via INTRAVENOUS

## 2021-05-02 MED ORDER — STERILE WATER FOR IRRIGATION IR SOLN
Status: DC | PRN
  Administered 2021-05-02: 120 mL
  Administered 2021-05-02 (×3): 60 mL

## 2021-05-02 MED ORDER — LIDOCAINE HCL (CARDIAC) PF 100 MG/5ML IV SOSY
PREFILLED_SYRINGE | INTRAVENOUS | Status: DC | PRN
Start: 2021-05-02 — End: 2021-05-02
  Administered 2021-05-02: 50 mg via INTRAVENOUS

## 2021-05-02 MED ORDER — PHENYLEPHRINE 40 MCG/ML (10ML) SYRINGE FOR IV PUSH (FOR BLOOD PRESSURE SUPPORT)
PREFILLED_SYRINGE | INTRAVENOUS | Status: DC | PRN
  Administered 2021-05-02: 120 ug via INTRAVENOUS

## 2021-05-02 NOTE — Anesthesia Procedure Notes (Signed)
Date/Time: 05/02/2021 8:50 AM Performed by: Orlie Dakin, CRNA Pre-anesthesia Checklist: Patient identified, Emergency Drugs available, Suction available and Patient being monitored Patient Re-evaluated:Patient Re-evaluated prior to induction Oxygen Delivery Method: Nasal cannula Induction Type: IV induction Placement Confirmation: positive ETCO2

## 2021-05-02 NOTE — Discharge Instructions (Addendum)
Resume Celebrex on 05/03/2021 Resume other medications as before High-fiber diet No driving for 24 hours Physician will call with biopsy results.

## 2021-05-02 NOTE — Anesthesia Postprocedure Evaluation (Signed)
Anesthesia Post Note  Patient: Ethan Oliver  Procedure(s) Performed: COLONOSCOPY WITH PROPOFOL POLYPECTOMY INTESTINAL  Patient location during evaluation: Phase II Anesthesia Type: General Level of consciousness: awake and alert and oriented Pain management: pain level controlled Vital Signs Assessment: post-procedure vital signs reviewed and stable Respiratory status: spontaneous breathing, nonlabored ventilation and respiratory function stable Cardiovascular status: blood pressure returned to baseline and stable Postop Assessment: no apparent nausea or vomiting Anesthetic complications: no   No notable events documented.   Last Vitals:  Vitals:   05/02/21 0748 05/02/21 0910  BP: (!) 145/91 97/62  Pulse: 86 99  Resp: 18 20  Temp: 36.7 C 36.5 C  SpO2: 95% 95%    Last Pain:  Vitals:   05/02/21 0910  TempSrc: Oral  PainSc: 0-No pain                 [name]

## 2021-05-02 NOTE — H&P (Signed)
Ethan Oliver is an 66 y.o. male.   Chief Complaint: Patient is here for colonoscopy. HPI: Patient is 66 year old Caucasian male who is here for screening colonoscopy.  Last exam was in December 2011 revealing single diverticulum and external hemorrhoids.  He was scheduled for colonoscopy over a year ago but procedure was canceled because his sister was sick with liver problems. He denies abdominal pain change in bowel habits or rectal bleeding.  He is on Celebrex 100 mg daily.  He tries not to take the second dose.  He did not take Celebrex yesterday.  He states he takes Excedrin Migraine occasionally. He is also on pain medication for chronic back pain.  He has had back surgery. Family history is negative for colon cancer.  Past Medical History:  Diagnosis Date   Arthritis    Chronic back pain    Depression    Diabetes mellitus without complication ([place])    History of gout    History of kidney stones    Hypercholesteremia    Hypertension     Past Surgical History:  Procedure Laterality Date   ACHILLES TENDON SURGERY Left 12/29/2013   Procedure: PERCUTANEOUS TENDO-ACHILLES LENGTHENING;  Surgeon: Marcheta Grammes, DPM;  Location: [facility];  Service: Podiatry;  Laterality: Left;   CALCANEAL OSTEOTOMY WITH ILIAC CREST BONE GRAFT AND REPAIR Kraemer TENDON AND ACHILLES TENDON Left 12/29/2013   Procedure: CALCANEOCUBOID JOINT FUSION PERFORMED WITH BONE GRAFT;  Surgeon: Marcheta Grammes, DPM;  Location: [facility];  Service: Podiatry;  Laterality: Left;   CARPAL TUNNEL RELEASE Bilateral    Disk fusion     lumbar   FLEXOR TENOTOMY  Right 02/18/2018   Procedure: PERCUTANEOUS FLEXOR TENOTOMY/APPLICATION OF BK POSTERIOR SPLINT RLE;  Surgeon: Caprice Beaver, DPM;  Location: [facility];  Service: Podiatry;  Laterality: Right;   FOOT ARTHRODESIS Left 12/29/2013   Procedure: TRIPLE ARTHRODESIS FOOT;  Surgeon: Marcheta Grammes, DPM;  Location: [facility];  Service: Podiatry;  Laterality:  Left;   FOOT ARTHRODESIS Right 02/18/2018   Procedure: TRIPLE ARTHRODESIS RIGHT FOOT;  Surgeon: Caprice Beaver, DPM;  Location: [facility];  Service: Podiatry;  Laterality: Right;   KNEE SURGERY Right    Surgery on this knee twice.     Family History  Problem Relation Age of Onset   Healthy Mother    Healthy Father    Social History:  reports that he has never smoked. He quit smokeless tobacco use about 7 years ago.  His smokeless tobacco use included chew. He reports that he does not drink alcohol and does not use drugs.  Allergies:  Allergies  Allergen Reactions   Codeine Nausea Only    Medications Prior to Admission  Medication Sig Dispense Refill   celecoxib (CELEBREX) 100 MG capsule Take 100 mg by mouth daily.     citalopram (CELEXA) 20 MG tablet Take 20 mg by mouth daily.     glipiZIDE (GLUCOTROL XL) 5 MG 24 hr tablet Take 5 mg by mouth every evening.     hydrochlorothiazide (HYDRODIURIL) 12.5 MG tablet Take 12.5 mg by mouth daily.  3   lisinopril (PRINIVIL,ZESTRIL) 20 MG tablet Take 20 mg by mouth daily.     metFORMIN (GLUCOPHAGE-XR) 500 MG 24 hr tablet Take 500 mg by mouth daily with breakfast.     Oxycodone HCl 10 MG TABS Take 5 mg by mouth daily as needed (pain).     polyethylene glycol-electrolytes (TRILYTE) 420 g solution Take 4,000 mLs by mouth as directed. [place]  mL 0   pravastatin (PRAVACHOL) 40 MG tablet Take 40 mg by mouth daily.     aspirin-acetaminophen-caffeine (EXCEDRIN MIGRAINE) 250-250-65 MG tablet Take 1 tablet by mouth every 6 (six) hours as needed for headache.      Results for orders placed or performed during the hospital encounter of 05/02/21 (from the past 48 hour(s))  Glucose, capillary     Status: Abnormal   Collection Time: 05/02/21  7:48 AM  Result Value Ref Range   Glucose-Capillary 196 (H) 70 - 99 mg/dL    Comment: Glucose reference range applies only to samples taken after fasting for at least 8 hours.   No results found.  Review of  Systems  Blood pressure (!) 145/91, pulse 86, temperature 98 F (36.7 C), temperature source Oral, resp. rate 18, height 5\' 10"  (1.778 m), weight (!) 137.6 kg, SpO2 95 %. Physical Exam HENT:     Mouth/Throat:     Mouth: Mucous membranes are moist.     Pharynx: Oropharynx is clear.  Eyes:     Conjunctiva/sclera: Conjunctivae normal.  Cardiovascular:     Rate and Rhythm: Normal rate and regular rhythm.     Heart sounds: Normal heart sounds. No murmur heard. Pulmonary:     Effort: Pulmonary effort is normal.     Breath sounds: Normal breath sounds.  Abdominal:     Comments: Abdomen is full but soft and nontender with organomegaly or masses.  Musculoskeletal:        General: No swelling.     Cervical back: Neck supple.  Lymphadenopathy:     Cervical: No cervical adenopathy.  Skin:    General: Skin is warm and dry.  Neurological:     Mental Status: He is alert.     Assessment/Plan  Average risk screening colonoscopy.  Hildred Laser, MD 05/02/2021, 7:57 AM

## 2021-05-02 NOTE — Anesthesia Preprocedure Evaluation (Signed)
Anesthesia Evaluation  Patient identified by MRN, date of birth, ID band Patient awake    Reviewed: Allergy & Precautions, NPO status , Patient's Chart, lab work & pertinent test results  Airway Mallampati: II  TM Distance: >3 FB Neck ROM: Full    Dental  (+) Dental Advisory Given, Chipped   Pulmonary neg pulmonary ROS,    Pulmonary exam normal breath sounds clear to auscultation       Cardiovascular Exercise Tolerance: Good hypertension, Pt. on medications Normal cardiovascular exam Rhythm:Regular Rate:Normal     Neuro/Psych PSYCHIATRIC DISORDERS Depression negative neurological ROS     GI/Hepatic negative GI ROS, Neg liver ROS,   Endo/Other  diabetes, Well Controlled, Type 2, Oral Hypoglycemic AgentsMorbid obesity  Renal/GU negative Renal ROS     Musculoskeletal  (+) Arthritis , Osteoarthritis,    Abdominal   Peds  Hematology   Anesthesia Other Findings   Reproductive/Obstetrics                             Anesthesia Physical Anesthesia Plan  ASA: 3  Anesthesia Plan: General   Post-op Pain Management: Minimal or no pain anticipated   Induction: Intravenous  PONV Risk Score and Plan: TIVA  Airway Management Planned: Nasal Cannula and Natural Airway  Additional Equipment:   Intra-op Plan:   Post-operative Plan:   Informed Consent: I have reviewed the patients History and Physical, chart, labs and discussed the procedure including the risks, benefits and alternatives for the proposed anesthesia with the patient or authorized representative who has indicated his/her understanding and acceptance.     Dental advisory given  Plan Discussed with: CRNA and Surgeon  Anesthesia Plan Comments:         Anesthesia Quick Evaluation

## 2021-05-02 NOTE — Transfer of Care (Signed)
Immediate Anesthesia Transfer of Care Note  Patient: Ethan Oliver  Procedure(s) Performed: COLONOSCOPY WITH PROPOFOL POLYPECTOMY INTESTINAL  Patient Location: Short Stay  Anesthesia Type:General  Level of Consciousness: awake, alert  and oriented  Airway & Oxygen Therapy: Patient Spontanous Breathing  Post-op Assessment: Report given to RN, Post -op Vital signs reviewed and stable and Patient moving all extremities X 4  Post vital signs: Reviewed and stable  Last Vitals:  Vitals Value Taken Time  BP 97/62 05/02/21 0910  Temp 36.5 C 05/02/21 0910  Pulse 99 05/02/21 0910  Resp 20 05/02/21 0910  SpO2 95 % 05/02/21 0910    Last Pain:  Vitals:   05/02/21 0910  TempSrc: Oral  PainSc: 0-No pain         Complications: No notable events documented.

## 2021-05-02 NOTE — Op Note (Signed)
[facility] Patient Name: Ethan Oliver Procedure Date: 05/02/2021 8:38 AM MRN: [id number] Date of Birth: 09/19/1955 Attending MD: Hildred Laser , MD CSN: [id number] Age: 66 Admit Type: Outpatient Procedure:                Colonoscopy Indications:              Screening for colorectal malignant neoplasm Providers:                Hildred Laser, MD, Crystal Page, Raphael Gibney,                            Technician Referring MD:             Catalina Antigua, MD Medicines:                Propofol per Anesthesia Complications:            No immediate complications. Estimated Blood Loss:     Estimated blood loss was minimal. Procedure:                Pre-Anesthesia Assessment:                           - Prior to the procedure, a History and Physical                            was performed, and patient medications and                            allergies were reviewed. The patient's tolerance of                            previous anesthesia was also reviewed. The risks                            and benefits of the procedure and the sedation                            options and risks were discussed with the patient.                            All questions were answered, and informed consent                            was obtained. Prior Anticoagulants: The patient has                            taken no previous anticoagulant or antiplatelet                            agents except for NSAID medication. ASA Grade                            Assessment: III - A patient with severe systemic  disease. After reviewing the risks and benefits,                            the patient was deemed in satisfactory condition to                            undergo the procedure.                           After obtaining informed consent, the colonoscope                            was passed under direct vision. Throughout the                            procedure, the  patient's blood pressure, pulse, and                            oxygen saturations were monitored continuously. The                            [id number]-[id number] ([id number]) scope was introduced through                            the anus and advanced to the the cecum, identified                            by appendiceal orifice and ileocecal valve. The                            colonoscopy was performed without difficulty. The                            patient tolerated the procedure well. The quality                            of the bowel preparation was good. The ileocecal                            valve, appendiceal orifice, and rectum were                            photographed. Scope In: 8:49:15 AM Scope Out: 9:06:36 AM Scope Withdrawal Time: 0 hours 13 minutes 1 second  Total Procedure Duration: 0 hours 17 minutes 21 seconds  Findings:      The perianal and digital rectal examinations were normal.      A few diverticula were found in the sigmoid colon and ascending colon.      A small polyp was found in the mid sigmoid colon. The polyp was sessile.       Biopsies were taken with a cold forceps for histology.      External and internal hemorrhoids were found during retroflexion. The       hemorrhoids were small. Impression:               -  Diverticulosis in the sigmoid colon and in the                            ascending colon.                           - One small polyp in the mid sigmoid colon.                            Biopsied.                           - External and internal hemorrhoids. Moderate Sedation:      Per Anesthesia Care Recommendation:           - Patient has a contact number available for                            emergencies. The signs and symptoms of potential                            delayed complications were discussed with the                            patient. Return to normal activities tomorrow.                            Written discharge instructions  were provided to the                            patient.                           - High fiber diet today.                           - Continue present medications.                           - Await pathology results.                           - Repeat colonoscopy is recommended. The                            colonoscopy date will be determined after pathology                            results from today's exam become available for                            review. Procedure Code(s):        --- Professional ---                           [phone], Colonoscopy, flexible; with biopsy, single  or multiple Diagnosis Code(s):        --- Professional ---                           Z12.11, Encounter for screening for malignant                            neoplasm of colon                           K64.8, Other hemorrhoids                           K63.5, Polyp of colon                           K57.30, Diverticulosis of large intestine without                            perforation or abscess without bleeding CPT copyright 2019 American Medical [employer]. All rights reserved. The codes documented in this report are preliminary and upon coder review may  be revised to meet current compliance requirements. Hildred Laser, MD Hildred Laser, MD 05/02/2021 9:15:24 AM This report has been signed electronically. Number of Addenda: 0

## 2021-05-04 LAB — SURGICAL PATHOLOGY

## 2021-05-07 ENCOUNTER — Encounter (HOSPITAL_COMMUNITY): Payer: Self-pay | Admitting: Internal Medicine

## 2021-06-06 ENCOUNTER — Other Ambulatory Visit: Payer: Self-pay

## 2021-06-06 ENCOUNTER — Encounter: Payer: Self-pay | Admitting: Emergency Medicine

## 2021-06-06 ENCOUNTER — Ambulatory Visit
Admission: EM | Admit: 2021-06-06 | Discharge: 2021-06-06 | Disposition: A | Discharge status: Home / Self Care | Payer: PPO | Attending: Family Medicine | Admitting: Family Medicine

## 2021-06-06 DIAGNOSIS — J22 Unspecified acute lower respiratory infection: Secondary | ICD-10-CM | POA: Diagnosis not present

## 2021-06-06 MED ORDER — PREDNISONE 20 MG PO TABS: 40.0000 mg | ORAL_TABLET | Freq: Every day | ORAL | 0 refills | Status: DC

## 2021-06-06 MED ORDER — PROMETHAZINE-DM 6.25-15 MG/5ML PO SYRP: 5.0000 mL | ORAL_SOLUTION | Freq: Four times a day (QID) | ORAL | 0 refills | Status: DC | PRN

## 2021-06-06 MED ORDER — AZITHROMYCIN 250 MG PO TABS: ORAL_TABLET | ORAL | 0 refills | Status: DC

## 2021-06-06 NOTE — ED Provider Notes (Signed)
?[place] ? ? ? ?CSN: [id number] ?Arrival date & time: 06/06/21  0931 ? ? ?  ? ?History   ?Chief Complaint ?No chief complaint on file. ? ? ?HPI ?Ethan Oliver is a 66 y.o. male.  ? ?Presenting today with about 5-day history of nasal congestion, sinus pressure, dry cough, shortness of breath especially with exertion, fatigue, low-grade fevers.  Denies chest pain, abdominal pain, nausea vomiting or diarrhea.  So far trying Mucinex and other over-the-counter medications with no relief.  No known sick contacts recently.  No known history of chronic pulmonary disease. ? ? ?Past Medical History:  ?Diagnosis Date  ? Arthritis   ? Chronic back pain   ? Depression   ? Diabetes mellitus without complication ([place])   ? History of gout   ? History of kidney stones   ? Hypercholesteremia   ? Hypertension   ? ? ?There are no problems to display for this patient. ? ? ?Past Surgical History:  ?Procedure Laterality Date  ? ACHILLES TENDON SURGERY Left 12/29/2013  ? Procedure: PERCUTANEOUS TENDO-ACHILLES LENGTHENING;  Surgeon: Marcheta Grammes, DPM;  Location: [facility];  Service: Podiatry;  Laterality: Left;  ? CALCANEAL OSTEOTOMY WITH ILIAC CREST BONE GRAFT AND REPAIR SUBLEXING TENDON AND ACHILLES TENDON Left 12/29/2013  ? Procedure: CALCANEOCUBOID JOINT FUSION PERFORMED WITH BONE GRAFT;  Surgeon: Marcheta Grammes, DPM;  Location: [facility];  Service: Podiatry;  Laterality: Left;  ? CARPAL TUNNEL RELEASE Bilateral   ? COLONOSCOPY WITH PROPOFOL N/A 05/02/2021  ? Procedure: COLONOSCOPY WITH PROPOFOL;  Surgeon: Rogene Houston, MD;  Location: [facility];  Service: Endoscopy;  Laterality: N/A;  930  ? Disk fusion    ? lumbar  ? FLEXOR TENOTOMY  Right 02/18/2018  ? Procedure: PERCUTANEOUS FLEXOR TENOTOMY/APPLICATION OF BK POSTERIOR SPLINT RLE;  Surgeon: Caprice Beaver, DPM;  Location: [facility];  Service: Podiatry;  Laterality: Right;  ? FOOT ARTHRODESIS Left 12/29/2013  ? Procedure: TRIPLE ARTHRODESIS FOOT;   Surgeon: Marcheta Grammes, DPM;  Location: [facility];  Service: Podiatry;  Laterality: Left;  ? FOOT ARTHRODESIS Right 02/18/2018  ? Procedure: TRIPLE ARTHRODESIS RIGHT FOOT;  Surgeon: Caprice Beaver, DPM;  Location: [facility];  Service: Podiatry;  Laterality: Right;  ? KNEE SURGERY Right   ? Surgery on this knee twice.   ? POLYPECTOMY  05/02/2021  ? Procedure: POLYPECTOMY INTESTINAL;  Surgeon: Rogene Houston, MD;  Location: [facility];  Service: Endoscopy;;  ? ? ? ? ? ?Home Medications   ? ?Prior to Admission medications   ?Medication Sig Start Date End Date Taking? Authorizing Provider  ?azithromycin (ZITHROMAX) 250 MG tablet Take first 2 tablets together, then 1 every day until finished. 06/06/21  Yes Volney American, PA-C  ?predniSONE (DELTASONE) 20 MG tablet Take 2 tablets (40 mg total) by mouth daily with breakfast. 06/06/21  Yes Volney American, PA-C  ?promethazine-dextromethorphan (PROMETHAZINE-DM) 6.25-15 MG/5ML syrup Take 5 mLs by mouth 4 (four) times daily as needed. 06/06/21  Yes Volney American, PA-C  ?aspirin-acetaminophen-caffeine (EXCEDRIN MIGRAINE) [phone] MG tablet Take 1 tablet by mouth every 6 (six) hours as needed for headache.    [provider]  ?celecoxib (CELEBREX) 100 MG capsule Take 1 capsule (100 mg total) by mouth daily. 05/03/21   Rogene Houston, MD  ?citalopram (CELEXA) 20 MG tablet Take 20 mg by mouth daily.    [provider]  ?glipiZIDE (GLUCOTROL XL) 5 MG 24 hr tablet Take 5 mg by mouth  every evening.    [provider]  ?hydrochlorothiazide (HYDRODIURIL) 12.5 MG tablet Take 12.5 mg by mouth daily. 01/31/18   [provider]  ?lisinopril (PRINIVIL,ZESTRIL) 20 MG tablet Take 20 mg by mouth daily.    [provider]  ?metFORMIN (GLUCOPHAGE-XR) 500 MG 24 hr tablet Take 500 mg by mouth daily with breakfast.    [provider]  ?Oxycodone HCl 10 MG TABS Take 5 mg by mouth daily as needed (pain).     [provider]  ?pravastatin (PRAVACHOL) 40 MG tablet Take 40 mg by mouth daily.    [provider]  ? ? ?Family History ?Family History  ?Problem Relation Age of Onset  ? Healthy Mother   ? Healthy Father   ? ? ?Social History ?Social History  ? ?Tobacco Use  ? Smoking status: Never  ? Smokeless tobacco: Former  ?  Types: Chew  ?  Quit date: 09/21/2013  ?Vaping Use  ? Vaping Use: Never used  ?Substance Use Topics  ? Alcohol use: No  ? Drug use: No  ? ? ? ?Allergies   ?Codeine ? ? ?Review of Systems ?Review of Systems ?Per HPI ? ?Physical Exam ?Triage Vital Signs ?ED Triage Vitals  ?Enc Vitals Group  ?   BP 06/06/21 0939 130/81  ?   Pulse Rate 06/06/21 0939 (!) 103  ?   Resp 06/06/21 0939 18  ?   Temp 06/06/21 0939 98.3 ?F (36.8 ?C)  ?   Temp Source 06/06/21 0939 Oral  ?   SpO2 06/06/21 0939 93 %  ?   Weight --   ?   Height --   ?   Head Circumference --   ?   Peak Flow --   ?   Pain Score 06/06/21 0940 7  ?   Pain Loc --   ?   Pain Edu? --   ?   Excl. in [place]? --   ? ?No data found. ? ?Updated Vital Signs ?BP 130/81 (BP Location: Right Arm)   Pulse (!) 103   Temp 98.3 ?F (36.8 ?C) (Oral)   Resp 18   SpO2 93%  ? ?Visual Acuity ?Right Eye Distance:   ?Left Eye Distance:   ?Bilateral Distance:   ? ?Right Eye Near:   ?Left Eye Near:    ?Bilateral Near:    ? ?Physical Exam ?Vitals and nursing note reviewed.  ?Constitutional:   ?   Appearance: He is well-developed.  ?HENT:  ?   Head: Atraumatic.  ?   Right Ear: External ear normal.  ?   Left Ear: External ear normal.  ?   Nose: Nose normal.  ?   Mouth/Throat:  ?   Mouth: Mucous membranes are moist.  ?   Pharynx: Posterior oropharyngeal erythema present. No oropharyngeal exudate.  ?Eyes:  ?   Conjunctiva/sclera: Conjunctivae normal.  ?   Pupils: Pupils are equal, round, and reactive to light.  ?Cardiovascular:  ?   Rate and Rhythm: Normal rate and regular rhythm.  ?Pulmonary:  ?   Effort: Pulmonary effort is normal. No respiratory distress.  ?   Breath  sounds: Wheezing present. No rales.  ?Musculoskeletal:     ?   General: Normal range of motion.  ?   Cervical back: Normal range of motion and neck supple.  ?Lymphadenopathy:  ?   Cervical: No cervical adenopathy.  ?Skin: ?   General: Skin is warm and dry.  ?Neurological:  ?   Mental Status:  He is alert and oriented to person, place, and time.  ?Psychiatric:     ?   Behavior: Behavior normal.  ? ? ? ?UC Treatments / Results  ?Labs ?(all labs ordered are listed, but only abnormal results are displayed) ?Labs Reviewed - No data to display ? ?EKG ? ? ?Radiology ?No results found. ? ?Procedures ?Procedures (including critical care time) ? ?Medications Ordered in UC ?Medications - No data to display ? ?Initial Impression / Assessment and Plan / UC Course  ?I have reviewed the triage vital signs and the nursing notes. ? ?Pertinent labs & imaging results that were available during my care of the patient were reviewed by me and considered in my medical decision making (see chart for details). ? ?  ? ?Overall fairly well-appearing, however given worsening course of symptoms, oxygen saturation lower than baseline and shortness of breath will cover for lower respiratory infection with azithromycin, prednisone, Phenergan DM.  Discussed ported over-the-counter medications and home care.  Return for any worsening symptoms. ? ?Final Clinical Impressions(s) / UC Diagnoses  ? ?Final diagnoses:  ?Lower respiratory infection  ? ?Discharge Instructions   ?None ?  ? ?ED Prescriptions   ? ? Medication Sig Dispense Auth. Provider  ? azithromycin (ZITHROMAX) 250 MG tablet Take first 2 tablets together, then 1 every day until finished. 6 tablet Volney American, [place]  ? predniSONE (DELTASONE) 20 MG tablet Take 2 tablets (40 mg total) by mouth daily with breakfast. 10 tablet Volney American, PA-C  ? promethazine-dextromethorphan (PROMETHAZINE-DM) 6.25-15 MG/5ML syrup Take 5 mLs by mouth 4 (four) times daily as needed. 100 mL  Volney American, [place]  ? ?  ? ?PDMP not reviewed this encounter. ?  ?Volney American, PA-C ?06/06/21 1049 ? ?

## 2021-06-06 NOTE — ED Triage Notes (Signed)
Head stopped up, dry cough, feels winded.  Can't blow anything out of nose x 4 to 5 days. ?

## 2021-07-10 ENCOUNTER — Other Ambulatory Visit: Payer: Self-pay | Admitting: Family Medicine

## 2021-08-09 ENCOUNTER — Telehealth: Payer: Self-pay | Admitting: Radiology

## 2021-08-09 NOTE — Telephone Encounter (Signed)
Wife called, asked to schedule appt with Dr Aline Brochure.  Patient has been seen in [facility] by another provider, she thinks it was Dr Percell Miller at [place].  She will call and request the notes and cd and bring them to [place], so we can have Dr Aline Brochure review and advise on scheduling appt for second opinion.

## 2021-09-12 ENCOUNTER — Encounter: Payer: Self-pay | Admitting: Orthopedic Surgery

## 2021-09-12 ENCOUNTER — Ambulatory Visit: Payer: PPO | Admitting: Orthopedic Surgery

## 2021-09-12 VITALS — BP 137/88 | HR 77 | Ht 70.0 in | Wt 307.0 lb

## 2021-09-12 DIAGNOSIS — M961 Postlaminectomy syndrome, not elsewhere classified: Secondary | ICD-10-CM | POA: Insufficient documentation

## 2021-09-12 DIAGNOSIS — M170 Bilateral primary osteoarthritis of knee: Secondary | ICD-10-CM | POA: Diagnosis not present

## 2021-09-12 DIAGNOSIS — F39 Unspecified mood [affective] disorder: Secondary | ICD-10-CM | POA: Insufficient documentation

## 2021-09-12 NOTE — Progress Notes (Signed)
  Chief Complaint  Patient presents with   Knee Pain    Bilateral     HPI: 66 year old male bilateral knee pain history of previous evaluation by Charyl Dancer 2 notes from August 10 and April 2022 are noted.  The patient had injection in both knees with Kenalog and triamcinolone injections and Mobic prescription and brace presents with bilateral knee pain evaluation and wishing to transfer care  Patient says his knees are very bad has a lot of pain but gets good relief with injection.  He would also like to consider viscosupplementation  Past Medical History:  Diagnosis Date   Arthritis    Chronic back pain    Depression    Diabetes mellitus without complication ([facility])    History of gout    History of kidney stones    Hypercholesteremia    Hypertension     BP 137/88   Pulse 77   Ht '5\' 10"'$  (1.778 m)   Wt (!) 307 lb (139.3 kg)   BMI 44.05 kg/m   The patient meets the AMA guidelines for Morbid (severe) obesity with a BMI > 40.0 and I have recommended weight loss.  General appearance: Well-developed well-nourished no gross deformities  Cardiovascular normal pulse and perfusion normal color without edema  Neurologically no sensation loss or deficits or pathologic reflexes  Psychological: Awake alert and oriented x3 mood and affect normal  Skin no lacerations or ulcerations no nodularity no palpable masses, no erythema or nodularity  Musculoskeletal: Bilateral knee varus with medial compartment tenderness bilaterally he actually has a good range of motion of 120 degrees in both knees no significant flexion contracture both knees are stable  Imaging please see media for images which were brought with the disc and pictures were obtained and placed in the chart  He is bone-on-bone with 8 to 10 degree varus both knees multiple osteophytes as well  A/P  Encounter Diagnosis  Name Primary?   Bilateral primary osteoarthritis of knee Yes   The patient will need to lose  weight we will need to know his A1c the last 1 was over 9  He is a candidate for viscosupplementation  Procedure note for bilateral knee injections  Procedure note left knee injection verbal consent was obtained to inject left knee joint  Timeout was completed to confirm the site of injection  The medications used were 40 mg depomedrol and 3 cc of 1% lidocaine  Anesthesia was provided by ethyl chloride and the skin was prepped with alcohol.  After cleaning the skin with alcohol a 20-gauge needle was used to inject the left knee joint. There were no complications. A sterile bandage was applied.   Procedure note right knee injection verbal consent was obtained to inject right knee joint  Timeout was completed to confirm the site of injection  The medications used were 40 mg depomedrol and 3 cc of 1% lidocaine  Anesthesia was provided by ethyl chloride and the skin was prepped with alcohol.  After cleaning the skin with alcohol a 20-gauge needle was used to inject the right knee joint. There were no complications. A sterile bandage was applied.   He will call in 2 months to get another injection if he is cleared for viscosupplementation we will go with that if not we will give him cortisone injections

## 2021-09-12 NOTE — Progress Notes (Signed)
Chief Complaint  Patient presents with   Knee Pain    Bilateral

## 2021-11-29 ENCOUNTER — Encounter: Payer: Self-pay | Admitting: Orthopedic Surgery

## 2021-11-29 ENCOUNTER — Ambulatory Visit: Payer: PPO | Admitting: Orthopedic Surgery

## 2021-11-29 DIAGNOSIS — M170 Bilateral primary osteoarthritis of knee: Secondary | ICD-10-CM

## 2021-11-29 MED ORDER — METHYLPREDNISOLONE ACETATE 40 MG/ML IJ SUSP
40.0000 mg | Freq: Once | INTRAMUSCULAR | Status: AC
  Administered 2021-11-29: 40 mg via INTRA_ARTICULAR

## 2021-11-29 NOTE — Progress Notes (Signed)
Chief Complaint  Patient presents with   Knee Pain    Bil knee pain, was at beach last week, left knee gave way.  Really hurting, worse than they were before.  +DM, recent A1C was 7.1. Wants injections, and to discuss all.    Encounter Diagnosis  Name Primary?   Bilateral primary osteoarthritis of knee Yes    Mr. Wainwright continues to try to lose weight he lost about 13 pounds and gained some of it back he is currently down about 8 pounds  His diabetes management has improved his hemoglobin A1c is 7.1 he was asking if he could get the surgery done despite his BMI being too high  That is on advisable we told him and he should continue with weight loss exercise nutrition consult if needed  I repeated his knee injections  Procedure note for bilateral knee injections  Procedure note left knee injection verbal consent was obtained to inject left knee joint  Timeout was completed to confirm the site of injection  The medications used were 40 mg depomedrol and 3 cc of 1% lidocaine  Anesthesia was provided by ethyl chloride and the skin was prepped with alcohol.  After cleaning the skin with alcohol a 20-gauge needle was used to inject the left knee joint. There were no complications. A sterile bandage was applied.   Procedure note right knee injection verbal consent was obtained to inject right knee joint  Timeout was completed to confirm the site of injection  The medications used were 40 mg depomedrol and 3 cc of 1% lidocaine  Anesthesia was provided by ethyl chloride and the skin was prepped with alcohol.  After cleaning the skin with alcohol a 20-gauge needle was used to inject the right knee joint. There were no complications. A sterile bandage was applied.

## 2022-01-25 ENCOUNTER — Encounter: Payer: Self-pay | Admitting: Orthopedic Surgery

## 2022-01-25 ENCOUNTER — Ambulatory Visit (INDEPENDENT_AMBULATORY_CARE_PROVIDER_SITE_OTHER): Payer: PPO | Admitting: Orthopedic Surgery

## 2022-01-25 DIAGNOSIS — M170 Bilateral primary osteoarthritis of knee: Secondary | ICD-10-CM | POA: Diagnosis not present

## 2022-01-25 MED ORDER — METHYLPREDNISOLONE ACETATE 40 MG/ML IJ SUSP
40.0000 mg | Freq: Once | INTRAMUSCULAR | Status: AC
  Administered 2022-01-25: 40 mg via INTRA_ARTICULAR

## 2022-01-25 NOTE — Progress Notes (Signed)
Chief Complaint  Patient presents with   Knee Pain    Bilateral knee pain   Injections    Bilat knee    Encounter Diagnosis  Name Primary?   Bilateral primary osteoarthritis of knee Yes    Procedure note for bilateral knee injections  Procedure note left knee injection verbal consent was obtained to inject left knee joint  Timeout was completed to confirm the site of injection  The medications used were 40 mg depomedrol and 3 cc of 1% lidocaine  Anesthesia was provided by ethyl chloride and the skin was prepped with alcohol.  After cleaning the skin with alcohol a 20-gauge needle was used to inject the left knee joint. There were no complications. A sterile bandage was applied.   Procedure note right knee injection verbal consent was obtained to inject right knee joint  Timeout was completed to confirm the site of injection  The medications used were 40 mg depomedrol and 3 cc of 1% lidocaine  Anesthesia was provided by ethyl chloride and the skin was prepped with alcohol.  After cleaning the skin with alcohol a 20-gauge needle was used to inject the right knee joint. There were no complications. A sterile bandage was applied.  He's ready to schedule total knee replacement.  He is lost 12 pounds.  We will see him in December to schedule in January

## 2022-03-08 ENCOUNTER — Ambulatory Visit: Payer: PPO | Admitting: Orthopedic Surgery

## 2022-03-15 ENCOUNTER — Encounter: Payer: Self-pay | Admitting: Orthopedic Surgery

## 2022-03-15 ENCOUNTER — Ambulatory Visit (INDEPENDENT_AMBULATORY_CARE_PROVIDER_SITE_OTHER): Payer: PPO

## 2022-03-15 ENCOUNTER — Ambulatory Visit (INDEPENDENT_AMBULATORY_CARE_PROVIDER_SITE_OTHER): Payer: PPO | Admitting: Orthopedic Surgery

## 2022-03-15 VITALS — BP 146/88 | HR 72 | Ht 71.0 in | Wt 303.0 lb

## 2022-03-15 DIAGNOSIS — Z01818 Encounter for other preprocedural examination: Secondary | ICD-10-CM

## 2022-03-15 DIAGNOSIS — M1712 Unilateral primary osteoarthritis, left knee: Secondary | ICD-10-CM | POA: Diagnosis not present

## 2022-03-15 DIAGNOSIS — G8929 Other chronic pain: Secondary | ICD-10-CM

## 2022-03-15 MED ORDER — BUPIVACAINE-MELOXICAM ER 400-12 MG/14ML IJ SOLN
400.0000 mg | Freq: Once | INTRAMUSCULAR | Status: DC
Start: 2022-03-15 — End: 2022-05-06

## 2022-03-15 NOTE — Patient Instructions (Addendum)
Your surgery will be at [facility] by Dr Harrison  plan to be in hospital overnight. The hospital will contact you with a preoperative appointment to discuss Anesthesia.  Please arrive on time or 15 minutes early for the preoperative appointment, they have a very tight schedule if you are late or do not come in your surgery will be cancelled.  The phone number for the preop area is [phone]. Please bring your medications with you for the appointment. They will tell you the arrival time for surgery and medication instructions when you have your preoperative evaluation. Do not wear nail polish the day of your surgery and if you take Phentermine you need to stop this medication ONE WEEK prior to your surgery. f you take Invokana, Farxiga, Jardiance, or Steglatro) - Hold 72 hours before the procedure.  If you take Ozempic,  Bydureon or Trulicity do not take for 8 days before your surgery. If you take Victoza, Rybelsis, Saxenda or Adlyxi stop 24 hours before the procedure. Please arrive at the hospital 2 hours before procedure if scheduled at 9:30 or later in the day or at the time the nurse tells you at your preoperative visit.   If you have my chart do not use the time given in my chart use the time given to you by the nurse during your preoperative visit.   Your surgery  time may change. Please be available for phone calls the day of your surgery and the day before. The Short Stay department may need to discuss changes about your surgery time. Not reaching the you could lead to procedure delays and possible cancellation.  You must have a ride home and someone to stay with you for 24 to 48 hours. The person taking you home will receive and sign for the your discharge instructions.  Please be prepared to give your support person's name and telephone number to [facility] [employer]. Dr Harrison will need that name and phone number post procedure.   You will also get a call from a representative of [employer], they have a machine that you will use in the first few weeks after surgery. It is called a CPM.   You will have home physical therapy for 2 weeks after surgery, the home health agency will call you before or just following the surgery to set up visits. [facility] is the agency we normally use, unless you request another agency.   You will get a call also from outpatient therapy for therapy starting when the home therapy is done.  If you have questions or need to Reschedule the surgery, call the office ask for Areatha Kalata.    You have decided to proceed with knee replacement surgery. You have decided not to continue with nonoperative measures such as but not limited to oral medication, weight loss, activity modification, physical therapy, bracing, or injection.  We will perform the procedure commonly known as total knee replacement. Some of the risks associated with knee replacement surgery include but are not limited to Bleeding Infection Swelling Stiffness Blood clot Pulmonary embolism  Loosening of the implant Pain that persists even after surgery  Infection is especially devastating complication of knee surgery although rare. If infection does occur your implant will usually have to be removed and several surgeries and antibiotics will be needed to eradicate the infection prior to performing a repeat replacement.   In some cases amputation is required to eradicate the infection. In other rare cases a knee fusion is   needed   In compliance with recent [place] law in federal regulation regarding opioid use and abuse and addiction, we will taper (stop) opioid medication after 2 weeks.  If you're not comfortable with these risks and would like to continue with nonoperative treatment please let Dr. Harrison know prior to your surgery.  

## 2022-03-15 NOTE — Progress Notes (Unsigned)
Chief Complaint  Patient presents with   Knee Pain    LT/ PRE-OP Would like to discuss TKA LT    HPI preop for left tka    67 year old male with chronic knee pain from osteoarthritis.  He is ready to have his left total knee.  He takes a little bit of oxycodone depending on his activity but sparingly  He also is also diabetic has hypertension he has disabling left knee pain  We did discuss the following arthrofibrosis, blood clot, infection and the increased risk with diabetes, the lengthening of the left leg and the need for lift on the right side  The patient lives in [place]  He has 2 steps to get in the house the bathroom and bedroom are on the first floor  The patient does have high risk for arthrofibrosis because he is not opioid nave.  We will probably have to increase his oxycodone after surgery and then taper over 6 weeks.  The anticoagulation will be needed for 6 weeks  His range of motion is 0-110.  He is in varus he has tenderness over the medial joint line mild effusion quadricep strength normal  He has bilateral pes planus  New images were obtained today.  Plan is for left total knee standard techniques  AP lateral right and left knee with left knee lateral and sunrise.  Major osteophytes calcification of the popliteal vessels as well.  Significant narrowing medial compartment.  Posterior osteophytes.  Varus alignment.  Narrowing medial compartment.  Subchondral sclerosis mild no cyst formation  Grade 4 arthritis left knee  The procedure has been fully reviewed with the patient; The risks and benefits of surgery have been discussed and explained and understood. Alternative treatment has also been reviewed, questions were encouraged and answered. The postoperative plan is also been reviewed.

## 2022-03-20 ENCOUNTER — Other Ambulatory Visit: Payer: Self-pay | Admitting: Orthopedic Surgery

## 2022-03-20 DIAGNOSIS — M1712 Unilateral primary osteoarthritis, left knee: Secondary | ICD-10-CM

## 2022-03-21 IMAGING — MR MR ELBOW*L* W/O CM
4 of 5 series · 25 of 40 positions shown · non-contrast
Comparison: None.

CLINICAL DATA: Left elbow pain and weakness for 1 and half months.

EXAM:
MRI OF THE LEFT ELBOW WITHOUT CONTRAST
TECHNIQUE: Multiplanar, multisequence MR imaging of the elbow was performed. No
intravenous contrast was administered.

[Series 4: T2 fat-sat · axial · 3.0mm · 0.29mm/px · z∈[-43,+81]mm · 8 of 32 slices shown (1 of 2)]
[im 1/32]
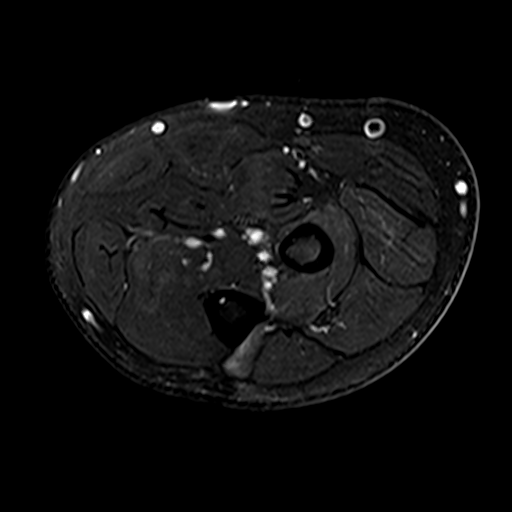
[im 4/32]
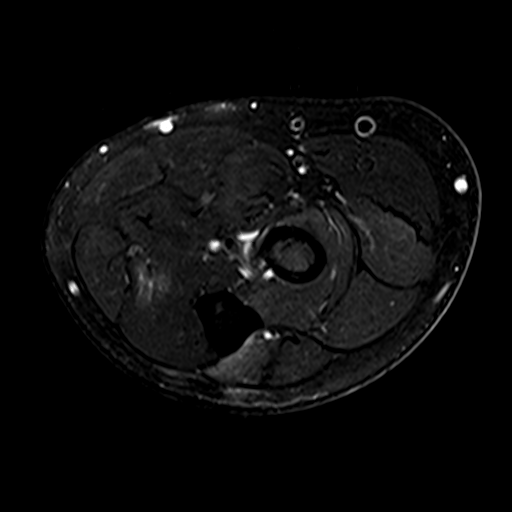
[im 11/32]
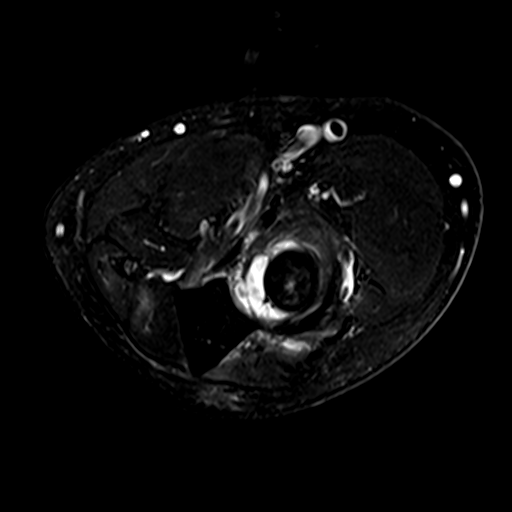
[im 14/32]
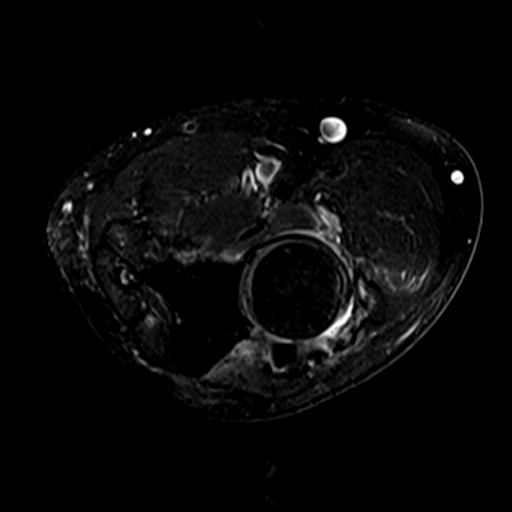
[im 18/32]
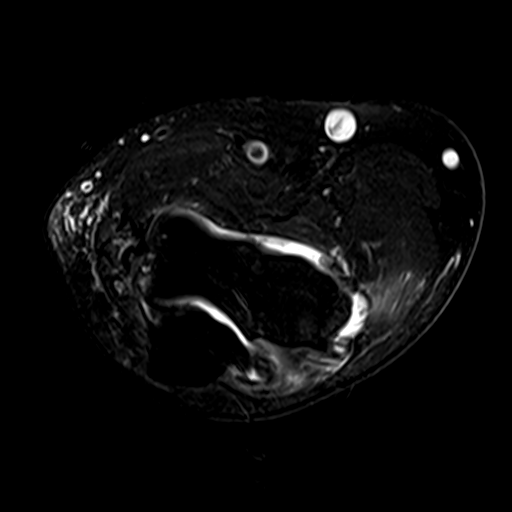
[im 21/32]
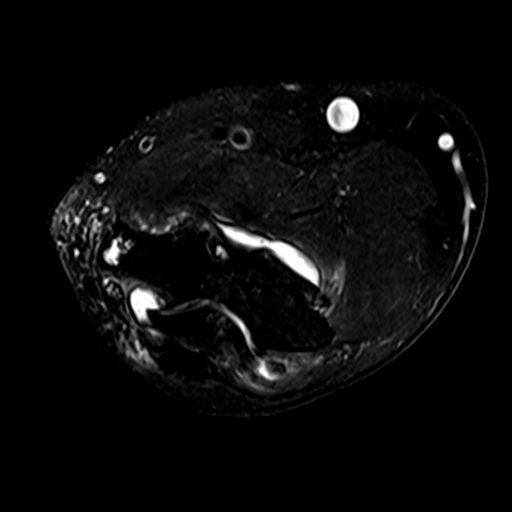
[im 28/32]
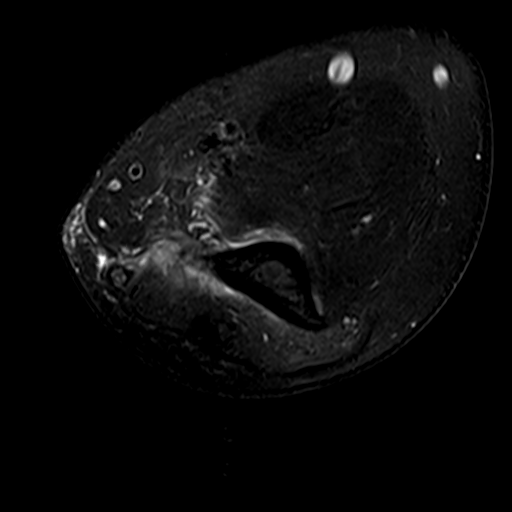
[im 32/32]
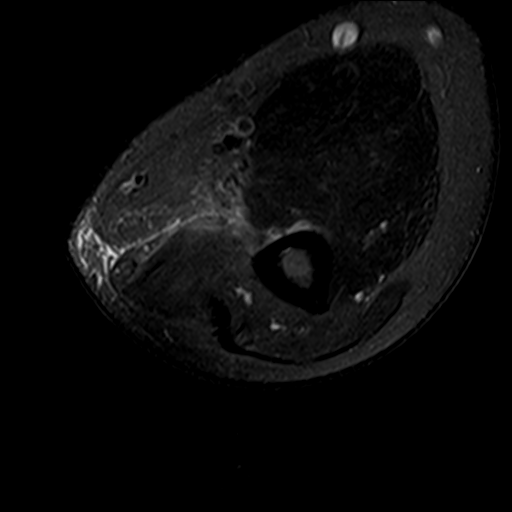

[Series 5: T1 · axial · 3.0mm · 0.29mm/px · z∈[-31,+65]mm · 3 of 32 slices shown]
[im 4/32]
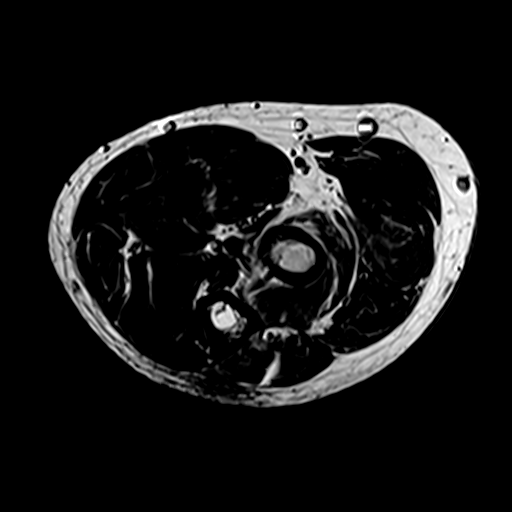
[im 18/32]
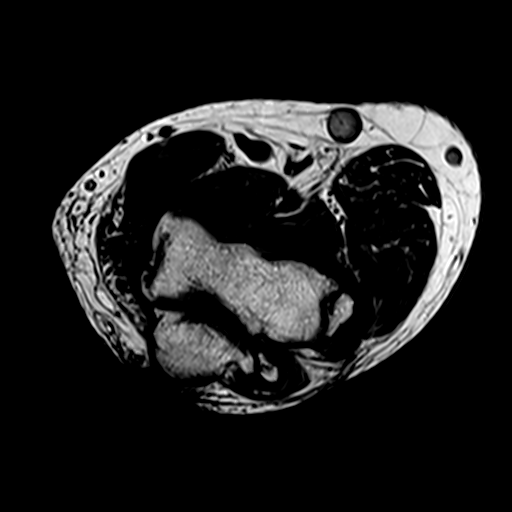
[im 28/32]
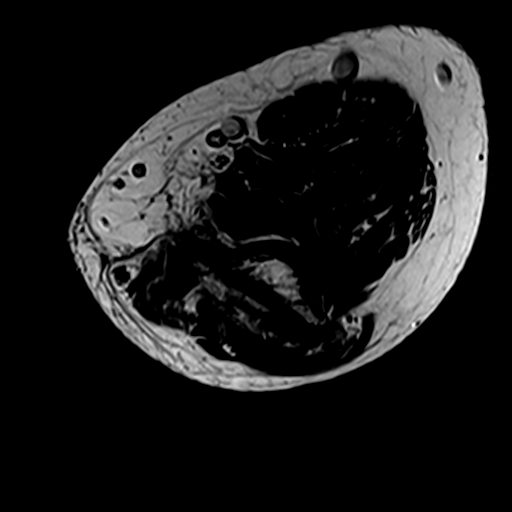

[Series 7: T2 fat-sat · coronal · 3.0mm · 0.29mm/px · 6 of 20 slices shown (2 of 2)]
[im 1/20]
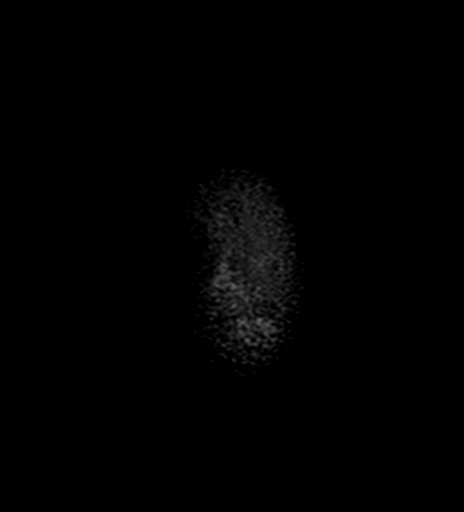
[im 4/20]
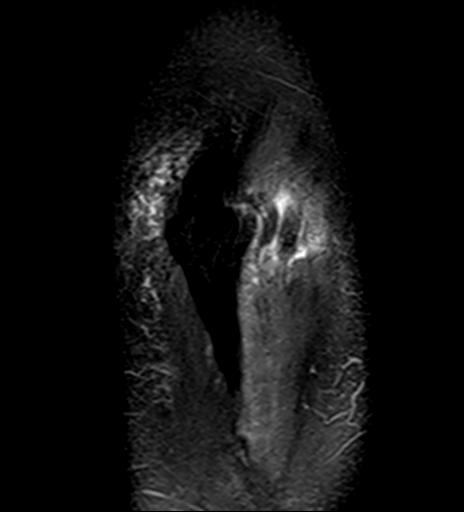
[im 8/20]
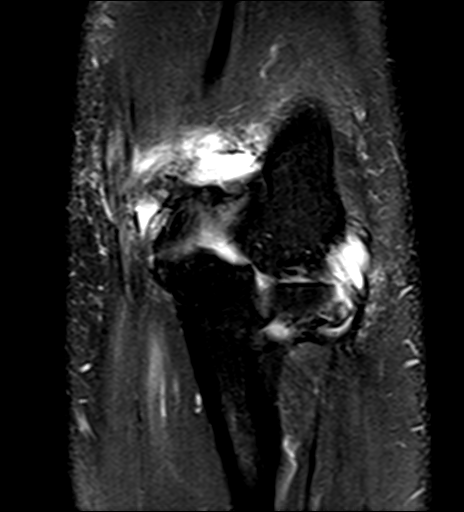
[im 12/20]
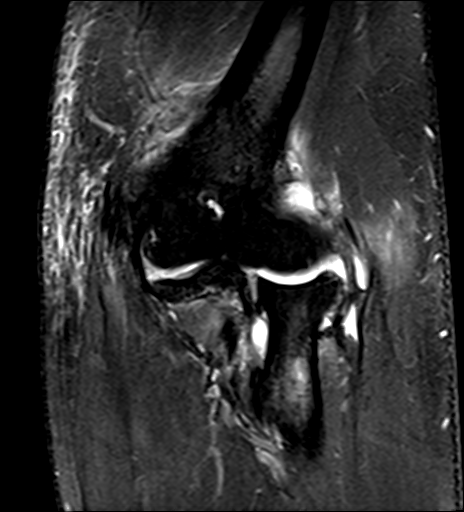
[im 16/20]
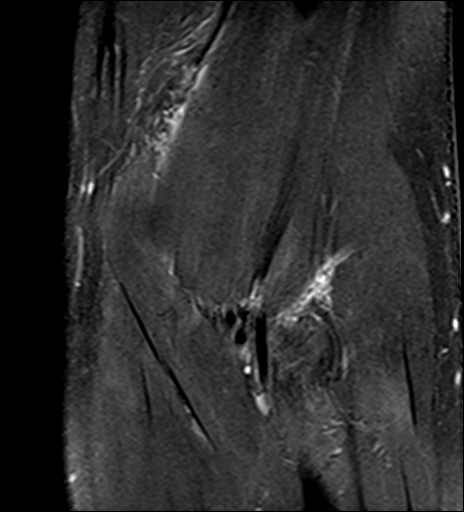
[im 20/20]
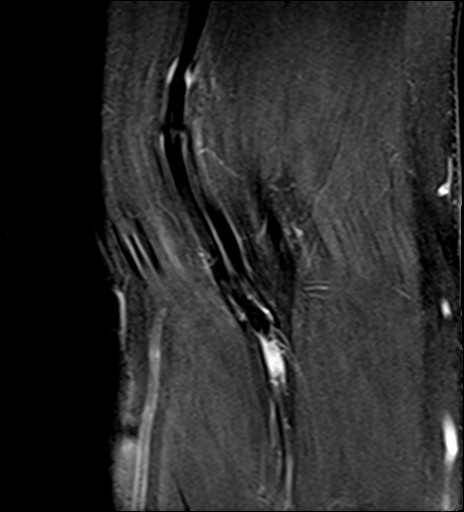

[Series 8: PD fat-sat · sagittal · 3.0mm · 0.55mm/px · 8 of 27 slices shown]
[im 1/27]
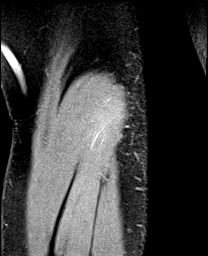
[im 4/27]
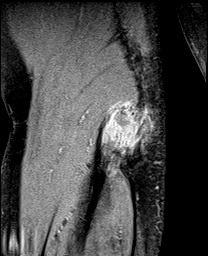
[im 8/27]
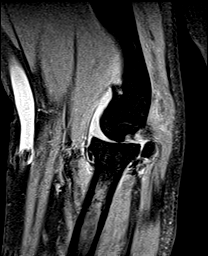
[im 12/27]
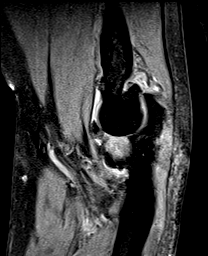
[im 15/27]
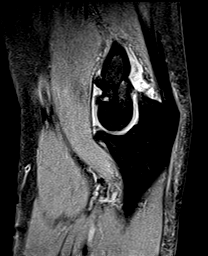
[im 19/27]
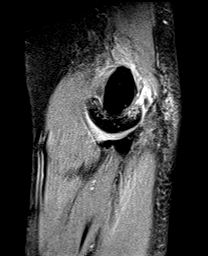
[im 23/27]
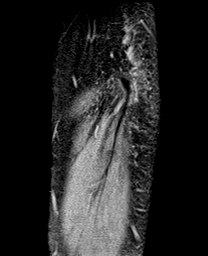
[im 27/27]
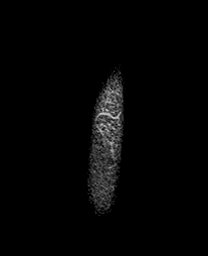

[25 of 40 positions shown; findings below may reference images not displayed]

FINDINGS: TENDONS

Common forearm flexor origin: Tendinopathy and partial-thickness
tearing of the deep attachment fibers.

Common forearm extensor origin: Severe tendinopathy the and
full-thickness retracted tear. Maximum retraction is 12.5 mm. There
is significant surrounding inflammation/edema.

Biceps: Intact

Triceps: Intact

LIGAMENTS

Medial stabilizers: Intact

Lateral stabilizers: Partial thickness tearing of the radial
collateral ligament.

Cartilage: Severe degenerative chondrosis with areas of
full-thickness cartilage loss, joint space narrowing and osteophytic
spurring.

Joint: Large joint effusion with numerous loose bodies. The largest
is in the posterior joint space and measures 16 mm. Findings could
be due to synovial osteochondromatosis.

Cubital tunnel: No significant mass effect on the ulnar nerve.

Bones: No acute bony findings.
IMPRESSION: 1. Severe tendinopathy and full-thickness retracted tear of the
common extensor tendon.
2. Tendinopathy and partial-thickness tearing of the deep attachment
fibers of the common flexor tendon.
3. Partial-thickness tearing of the radial collateral ligament.
4. Severe elbow joint degenerative changes with areas of
full-thickness cartilage loss, joint space narrowing, osteophytic
spurring, joint effusion and numerous loose bodies.

## 2022-04-10 ENCOUNTER — Other Ambulatory Visit: Payer: Self-pay | Admitting: Orthopedic Surgery

## 2022-04-10 DIAGNOSIS — M1712 Unilateral primary osteoarthritis, left knee: Secondary | ICD-10-CM

## 2022-04-17 NOTE — Patient Instructions (Signed)
Ethan Oliver  04/17/2022     '@[email or web address]'$ @   Your procedure is scheduled on  04/23/2022.   Report to [facility] at  0600 A.M.   Call this number if you have problems the morning of surgery:  [phone]  If you experience any cold or flu symptoms such as cough, fever, chills, shortness of breath, etc. between now and your scheduled surgery, please notify us at the above number.   Remember:  Do not eat or drink after midnight.         DO NOT take any medications for diabetes the morning of your procedure.     Take these medicines the morning of surgery with A SIP OF WATER                       celexa, oxycodone(if needed).     Do not wear jewelry, make-up or nail polish.  Do not wear lotions, powders, or perfumes, or deodorant.  Do not shave 48 hours prior to surgery.  Men may shave face and neck.  Do not bring valuables to the hospital.  [facility] is not responsible for any belongings or valuables.  Contacts, dentures or bridgework may not be worn into surgery.  Leave your suitcase in the car.  After surgery it may be brought to your room.  For patients admitted to the hospital, discharge time will be determined by your treatment team.  Patients discharged the day of surgery will not be allowed to drive home and must have someone with them for 24 hours.    Special instructions:   DO NOT smoke tobacco or vape for 24 hours before your procedure.  Please read over the following fact sheets that you were given. Pain Booklet, Coughing and Deep Breathing, Blood Transfusion Information, Total Joint Packet, Surgical Site Infection Prevention, Anesthesia Post-op Instructions, and Care and Recovery After Surgery      Total Knee Replacement, Care After This sheet gives you information about how to care for yourself after your procedure. Your health care provider may also give you more specific instructions. If you have problems or questions, contact your  health care provider. What can I expect after the procedure? After the procedure, it is common to have: Redness, pain, and swelling at the incision area. Stiffness. Discomfort. A small amount of blood or clear fluid coming from your incision. Follow these instructions at home: Medicines Take over-the-counter and prescription medicines only as told by your health care provider. If you were prescribed a blood thinner (anticoagulant), take it as told by your health care provider. Ask your health care provider if the medicine prescribed to you: Requires you to avoid driving or using machinery. Can cause constipation. You may need to take these actions to prevent or treat constipation: Drink enough fluid to keep your urine pale yellow. Take over-the-counter or prescription medicines. Eat foods that are high in fiber, such as beans, whole grains, and fresh fruits and vegetables. Limit foods that are high in fat and processed sugars, such as fried or sweet foods. Incision care  Follow instructions from your health care provider about how to take care of your incision. Make sure you: Wash your hands with soap and water for at least 20 seconds before and after you change your bandage (dressing). If soap and water are not available, use hand sanitizer. Change your dressing as told by your health care provider. Leave  stitches (sutures), staples, skin glue, or adhesive strips in place. These skin closures may need to stay in place for 2 weeks or longer. If adhesive strip edges start to loosen and curl up, you may trim the loose edges. Do not remove adhesive strips completely unless your health care provider tells you to do that. Do not take baths, swim, or use a hot tub until your health care provider approves. Check your incision area every day for signs of infection. Check for: More redness, swelling, or pain. More fluid or blood. Warmth. Pus or a bad smell. Activity Rest as told by your health  care provider. Avoid sitting for a long time without moving. Get up to take short walks every 1-2 hours. This is important to improve blood flow and breathing. Ask for help if you feel weak or unsteady. Follow instructions from your health care provider about using a walker, crutches, or a cane. You may use your legs to support (bear) your body weight as told by your health care provider. Follow instructions about how much weight you may safely support on your affected leg (weight-bearing restrictions). A physical therapist may show you how to get out of a bed and chair and how to go up and down stairs. You will first do this with a walker, crutches, or a cane and then without any of these devices. Once you are able to walk without a limp, you may stop using a walker, crutches, or a cane. Do exercises as told by your health care provider or physical therapist. Avoid high-impact activities, including running, jumping rope, and doing jumping jacks. Do not play contact sports until your health care provider approves. Return to your normal activities as told by your health care provider. Ask your health care provider what activities are safe for you. Managing pain, stiffness, and swelling  If directed, put ice on your knee. To do this: Put ice in a plastic bag or use the icing device (cold flow pad) that you were given. Follow instructions from your health care provider about how to use the icing device. Place a towel between your skin and the bag or between your skin and the icing device. Leave the ice on for 20 minutes, 2-3 times a day. Remove the ice if your skin turns bright red. This is very important. If you cannot feel pain, heat, or cold, you have a greater risk of damage to the area. Move your toes often to reduce stiffness and swelling. Raise (elevate) your leg above the level of your heart while you are sitting or lying down. Use several pillows to keep your leg straight. Do not put a  pillow just under the knee. If the knee is bent for a long time, this may lead to stiffness. Wear elastic knee support as told by your health care provider. Safety  To help prevent falls, keep floors clear of objects you may trip over. Place items that you may need within easy reach. Wear an apron or tool belt with pockets for carrying objects. This leaves your hands free to help with your balance. Ask your health care provider when it is safe to drive. General instructions Wear compression stockings as told by your health care provider. These stockings help to prevent blood clots and reduce swelling in your legs. Continue with breathing exercises. This helps prevent lung infection. Do not use any products that contain nicotine or tobacco. These products include cigarettes, chewing tobacco, and vaping devices, such as e-cigarettes. These  can delay healing after surgery. If you need help quitting, ask your health care provider. Tell your health care provider if you plan to have dental work. Also: Tell your dentist about your joint replacement. Ask your health care provider if there are any special instructions you need to follow before having dental care and routine cleanings. Keep all follow-up visits. This is important. Contact a health care provider if: You have a fever or chills. You have a cough or feel short of breath. Your medicine is not controlling your pain. You have any of these signs of infection: More redness, swelling, or pain around your incision. More fluid or blood coming from your incision. Warmth coming from your incision. Pus or a bad smell coming from your incision. You fall. Get help right away if: You have severe pain. You have trouble breathing. You have chest pain. You have redness, swelling, pain, or warmth in your calf or leg. Your incision breaks open after sutures or staples are removed. These symptoms may represent a serious problem that is an emergency. Do  not wait to see if the symptoms will go away. Get medical help right away. Call your local emergency services (911 in the [place] Do not drive yourself to the hospital. Summary After the procedure, it is common to have pain and swelling at the incision area, a small amount of blood or fluid coming from your incision, and stiffness. Follow instructions from your health care provider about how to take care of your incision. Use crutches, a walker, or a cane as told by your health care provider. This information is not intended to replace advice given to you by your health care provider. Make sure you discuss any questions you have with your health care provider. Document Revised: 08/17/2019 Document Reviewed: 08/17/2019 Elsevier Patient Education  [place] Anesthesia, Adult, Care After The following information offers guidance on how to care for yourself after your procedure. Your health care provider may also give you more specific instructions. If you have problems or questions, contact your health care provider. What can I expect after the procedure? After the procedure, it is common for people to: Have pain or discomfort at the IV site. Have nausea or vomiting. Have a sore throat or hoarseness. Have trouble concentrating. Feel cold or chills. Feel weak, sleepy, or tired (fatigue). Have soreness and body aches. These can affect parts of the body that were not involved in surgery. Follow these instructions at home: For the time period you were told by your health care provider:  Rest. Do not participate in activities where you could fall or become injured. Do not drive or use machinery. Do not drink alcohol. Do not take sleeping pills or medicines that cause drowsiness. Do not make important decisions or sign legal documents. Do not take care of children on your own. General instructions Drink enough fluid to keep your urine pale yellow. If you have sleep apnea,  surgery and certain medicines can increase your risk for breathing problems. Follow instructions from your health care provider about wearing your sleep device: Anytime you are sleeping, including during daytime naps. While taking prescription pain medicines, sleeping medicines, or medicines that make you drowsy. Return to your normal activities as told by your health care provider. Ask your health care provider what activities are safe for you. Take over-the-counter and prescription medicines only as told by your health care provider. Do not use any products that contain nicotine or tobacco. These products include  cigarettes, chewing tobacco, and vaping devices, such as e-cigarettes. These can delay incision healing after surgery. If you need help quitting, ask your health care provider. Contact a health care provider if: You have nausea or vomiting that does not get better with medicine. You vomit every time you eat or drink. You have pain that does not get better with medicine. You cannot urinate or have bloody urine. You develop a skin rash. You have a fever. Get help right away if: You have trouble breathing. You have chest pain. You vomit blood. These symptoms may be an emergency. Get help right away. Call 911. Do not wait to see if the symptoms will go away. Do not drive yourself to the hospital. Summary After the procedure, it is common to have a sore throat, hoarseness, nausea, vomiting, or to feel weak, sleepy, or fatigue. For the time period you were told by your health care provider, do not drive or use machinery. Get help right away if you have difficulty breathing, have chest pain, or vomit blood. These symptoms may be an emergency. This information is not intended to replace advice given to you by your health care provider. Make sure you discuss any questions you have with your health care provider. Document Revised: 05/25/2021 Document Reviewed: 05/25/2021 Elsevier Patient  Education  [place]. How to Use Chlorhexidine Before Surgery Chlorhexidine gluconate (CHG) is a germ-killing (antiseptic) solution that is used to clean the skin. It can get rid of the bacteria that normally live on the skin and can keep them away for about 24 hours. To clean your skin with CHG, you may be given: A CHG solution to use in the shower or as part of a sponge bath. A prepackaged cloth that contains CHG. Cleaning your skin with CHG may help lower the risk for infection: While you are staying in the intensive care unit of the hospital. If you have a vascular access, such as a central line, to provide short-term or long-term access to your veins. If you have a catheter to drain urine from your bladder. If you are on a ventilator. A ventilator is a machine that helps you breathe by moving air in and out of your lungs. After surgery. What are the risks? Risks of using CHG include: A skin reaction. Hearing loss, if CHG gets in your ears and you have a perforated eardrum. Eye injury, if CHG gets in your eyes and is not rinsed out. The CHG product catching fire. Make sure that you avoid smoking and flames after applying CHG to your skin. Do not use CHG: If you have a chlorhexidine allergy or have previously reacted to chlorhexidine. On babies younger than 69 months of age. How to use CHG solution Use CHG only as told by your health care provider, and follow the instructions on the label. Use the full amount of CHG as directed. Usually, this is one bottle. During a shower Follow these steps when using CHG solution during a shower (unless your health care provider gives you different instructions): Start the shower. Use your normal soap and shampoo to wash your face and hair. Turn off the shower or move out of the shower stream. Pour the CHG onto a clean washcloth. Do not use any type of brush or rough-edged sponge. Starting at your neck, lather your body down to your toes.  Make sure you follow these instructions: If you will be having surgery, pay special attention to the part of your body where you  will be having surgery. Scrub this area for at least 1 minute. Do not use CHG on your head or face. If the solution gets into your ears or eyes, rinse them well with water. Avoid your genital area. Avoid any areas of skin that have broken skin, cuts, or scrapes. Scrub your back and under your arms. Make sure to wash skin folds. Let the lather sit on your skin for 1-2 minutes or as long as told by your health care provider. Thoroughly rinse your entire body in the shower. Make sure that all body creases and crevices are rinsed well. Dry off with a clean towel. Do not put any substances on your body afterward--such as powder, lotion, or perfume--unless you are told to do so by your health care provider. Only use lotions that are recommended by the manufacturer. Put on clean clothes or pajamas. If it is the night before your surgery, sleep in clean sheets.  During a sponge bath Follow these steps when using CHG solution during a sponge bath (unless your health care provider gives you different instructions): Use your normal soap and shampoo to wash your face and hair. Pour the CHG onto a clean washcloth. Starting at your neck, lather your body down to your toes. Make sure you follow these instructions: If you will be having surgery, pay special attention to the part of your body where you will be having surgery. Scrub this area for at least 1 minute. Do not use CHG on your head or face. If the solution gets into your ears or eyes, rinse them well with water. Avoid your genital area. Avoid any areas of skin that have broken skin, cuts, or scrapes. Scrub your back and under your arms. Make sure to wash skin folds. Let the lather sit on your skin for 1-2 minutes or as long as told by your health care provider. Using a different clean, wet washcloth, thoroughly rinse your  entire body. Make sure that all body creases and crevices are rinsed well. Dry off with a clean towel. Do not put any substances on your body afterward--such as powder, lotion, or perfume--unless you are told to do so by your health care provider. Only use lotions that are recommended by the manufacturer. Put on clean clothes or pajamas. If it is the night before your surgery, sleep in clean sheets. How to use CHG prepackaged cloths Only use CHG cloths as told by your health care provider, and follow the instructions on the label. Use the CHG cloth on clean, dry skin. Do not use the CHG cloth on your head or face unless your health care provider tells you to. When washing with the CHG cloth: Avoid your genital area. Avoid any areas of skin that have broken skin, cuts, or scrapes. Before surgery Follow these steps when using a CHG cloth to clean before surgery (unless your health care provider gives you different instructions): Using the CHG cloth, vigorously scrub the part of your body where you will be having surgery. Scrub using a back-and-forth motion for 3 minutes. The area on your body should be completely wet with CHG when you are done scrubbing. Do not rinse. Discard the cloth and let the area air-dry. Do not put any substances on the area afterward, such as powder, lotion, or perfume. Put on clean clothes or pajamas. If it is the night before your surgery, sleep in clean sheets.  For general bathing Follow these steps when using CHG cloths for general bathing (unless  your health care provider gives you different instructions). Use a separate CHG cloth for each area of your body. Make sure you wash between any folds of skin and between your fingers and toes. Wash your body in the following order, switching to a new cloth after each step: The front of your neck, shoulders, and chest. Both of your arms, under your arms, and your hands. Your stomach and groin area, avoiding the  genitals. Your right leg and foot. Your left leg and foot. The back of your neck, your back, and your buttocks. Do not rinse. Discard the cloth and let the area air-dry. Do not put any substances on your body afterward--such as powder, lotion, or perfume--unless you are told to do so by your health care provider. Only use lotions that are recommended by the manufacturer. Put on clean clothes or pajamas. Contact a health care provider if: Your skin gets irritated after scrubbing. You have questions about using your solution or cloth. You swallow any chlorhexidine. Call your local poison control center (1-660-287-1054 in the [place] Get help right away if: Your eyes itch badly, or they become very red or swollen. Your skin itches badly and is red or swollen. Your hearing changes. You have trouble seeing. You have swelling or tingling in your mouth or throat. You have trouble breathing. These symptoms may represent a serious problem that is an emergency. Do not wait to see if the symptoms will go away. Get medical help right away. Call your local emergency services (911 in the [place] Do not drive yourself to the hospital. Summary Chlorhexidine gluconate (CHG) is a germ-killing (antiseptic) solution that is used to clean the skin. Cleaning your skin with CHG may help to lower your risk for infection. You may be given CHG to use for bathing. It may be in a bottle or in a prepackaged cloth to use on your skin. Carefully follow your health care provider's instructions and the instructions on the product label. Do not use CHG if you have a chlorhexidine allergy. Contact your health care provider if your skin gets irritated after scrubbing. This information is not intended to replace advice given to you by your health care provider. Make sure you discuss any questions you have with your health care provider. Document Revised: 06/25/2021 Document Reviewed: 05/08/2020 Elsevier Patient Education  [place].

## 2022-04-19 ENCOUNTER — Encounter (HOSPITAL_COMMUNITY)
Admission: RE | Admit: 2022-04-19 | Discharge: 2022-04-19 | Disposition: A | Discharge status: Home / Self Care | Payer: PPO | Source: Ambulatory Visit | Attending: Orthopedic Surgery | Admitting: Orthopedic Surgery

## 2022-04-19 ENCOUNTER — Encounter (HOSPITAL_COMMUNITY): Payer: Self-pay

## 2022-04-19 ENCOUNTER — Other Ambulatory Visit: Payer: Self-pay

## 2022-04-19 VITALS — BP 135/89 | HR 74 | Temp 97.8°F | Resp 18 | Ht 71.0 in | Wt 302.9 lb

## 2022-04-19 DIAGNOSIS — I10 Essential (primary) hypertension: Secondary | ICD-10-CM | POA: Insufficient documentation

## 2022-04-19 DIAGNOSIS — M1712 Unilateral primary osteoarthritis, left knee: Secondary | ICD-10-CM | POA: Insufficient documentation

## 2022-04-19 DIAGNOSIS — E119 Type 2 diabetes mellitus without complications: Secondary | ICD-10-CM | POA: Diagnosis not present

## 2022-04-19 DIAGNOSIS — Z01818 Encounter for other preprocedural examination: Secondary | ICD-10-CM | POA: Insufficient documentation

## 2022-04-19 LAB — BASIC METABOLIC PANEL
Anion gap: 10 (ref 5–15)
BUN: 25 mg/dL — ABNORMAL HIGH (ref 8–23)
CO2: 23 mmol/L (ref 22–32)
Calcium: 9.2 mg/dL (ref 8.9–10.3)
Chloride: 102 mmol/L (ref 98–111)
Creatinine, Ser: 0.88 mg/dL (ref 0.61–1.24)
GFR, Estimated: >60 mL/min (ref >60)
Glucose, Bld: 134 mg/dL — ABNORMAL HIGH (ref 70–99)
Potassium: 3.9 mmol/L (ref 3.5–5.1)
Sodium: 135 mmol/L (ref 135–145)

## 2022-04-19 LAB — CBC WITH DIFFERENTIAL/PLATELET
Abs Immature Granulocytes: 0.05 10*3/uL (ref 0.00–0.07)
Basophils Absolute: 0.1 10*3/uL (ref 0.0–0.1)
Basophils Relative: 1 %
Eosinophils Absolute: 0.2 10*3/uL (ref 0.0–0.5)
Eosinophils Relative: 4 %
HCT: 47.1 % (ref 39.0–52.0)
Hemoglobin: 15.3 g/dL (ref 13.0–17.0)
Immature Granulocytes: 1 %
Lymphocytes Relative: 29 %
Lymphs Abs: 1.6 10*3/uL (ref 0.7–4.0)
MCH: 29.9 pg (ref 26.0–34.0)
MCHC: 32.5 g/dL (ref 30.0–36.0)
MCV: 92.2 fL (ref 80.0–100.0)
Monocytes Absolute: 0.5 10*3/uL (ref 0.1–1.0)
Monocytes Relative: 9 %
Neutro Abs: 3.1 10*3/uL (ref 1.7–7.7)
Neutrophils Relative %: 56 %
Platelets: 182 10*3/uL (ref 150–400)
RBC: 5.11 MIL/uL (ref 4.22–5.81)
RDW: 13.6 % (ref 11.5–15.5)
WBC: 5.5 10*3/uL (ref 4.0–10.5)
nRBC: 0 % (ref 0.0–0.2)

## 2022-04-19 LAB — SURGICAL PCR SCREEN
MRSA, PCR: NEGATIVE
Staphylococcus aureus: NEGATIVE

## 2022-04-19 LAB — HEMOGLOBIN A1C
Hgb A1c MFr Bld: 7.2 % — ABNORMAL HIGH (ref 4.8–5.6)
Mean Plasma Glucose: 159.94 mg/dL

## 2022-04-19 LAB — PREPARE RBC (CROSSMATCH)

## 2022-04-19 NOTE — Progress Notes (Signed)
   04/19/22 0918  OBSTRUCTIVE SLEEP APNEA  Have you ever been diagnosed with sleep apnea through a sleep study? No  Do you snore loudly (loud enough to be heard through closed doors)?  1  Do you often feel tired, fatigued, or sleepy during the daytime (such as falling asleep during driving or talking to someone)? 0  Has anyone observed you stop breathing during your sleep? 1  Do you have, or are you being treated for high blood pressure? 1  BMI more than 35 kg/m2? 1  Age > 50 (1-yes) 1  Neck circumference greater than:Male 16 inches or larger, Male 17inches or larger? 0  Male Gender (Yes=1) 1  Obstructive Sleep Apnea Score 6  Score 5 or greater  Results sent to PCP

## 2022-04-22 NOTE — H&P (Signed)
TOTAL KNEE ADMISSION H&P  Patient is being admitted for left total knee arthroplasty.  Subjective:  Chief Complaint:left knee pain.  HPI: Ethan Oliver, 67 y.o. male, has a history of pain and functional disability in the left knee due to arthritis and has failed non-surgical conservative treatments for greater than 12 weeks to includeNSAID's and/or analgesics, corticosteriod injections, weight reduction as appropriate, and activity modification.  Onset of symptoms was gradual, starting 3 years ago with gradually worsening course since that time. The patient noted no past surgery on the left knee(s).  Patient currently rates pain in the left knee(s) at 9 out of 10 with activity. Patient has night pain, worsening of pain with activity and weight bearing, pain that interferes with activities of daily living, pain with passive range of motion, and crepitus.  Patient has evidence of subchondral sclerosis, periarticular osteophytes, and joint space narrowing by imaging studies. This patient has had There is no active infection.  Patient Active Problem List   Diagnosis Date Noted   Mood disorder ([place]) 09/12/2021   Postlaminectomy syndrome of lumbar region 09/12/2021   Arthritis of left elbow 06/21/2019   Cubital tunnel syndrome on left 06/21/2019   Inflammation of left elbow 06/21/2019   Arthritis, lumbar spine 08/05/2018   Benign essential hypertension 11/07/2016   Generalized osteoarthritis of multiple sites 11/07/2016   Type 2 diabetes mellitus without complication, without long-term current use of insulin ([place]) 11/07/2016   Diastasis recti 11/07/2016   Pure hypercholesterolemia 11/07/2016   Morbid obesity with BMI of 40.0-44.9, adult ([place]) 12/27/2013   Back pain 12/27/2013   Degeneration of lumbar intervertebral disc 12/27/2013   Lumbar radiculopathy 12/27/2013   Lumbosacral spondylosis without myelopathy 12/28/2012   Past Medical History:  Diagnosis Date   Arthritis    Chronic back  pain    Depression    Diabetes mellitus without complication ([place])    History of gout    History of kidney stones    Hypercholesteremia    Hypertension     Past Surgical History:  Procedure Laterality Date   ACHILLES TENDON SURGERY Left 12/29/2013   Procedure: PERCUTANEOUS TENDO-ACHILLES LENGTHENING;  Surgeon: Marcheta Grammes, DPM;  Location: [facility];  Service: Podiatry;  Laterality: Left;   CALCANEAL OSTEOTOMY WITH ILIAC CREST BONE GRAFT AND REPAIR [place] TENDON AND ACHILLES TENDON Left 12/29/2013   Procedure: CALCANEOCUBOID JOINT FUSION PERFORMED WITH BONE GRAFT;  Surgeon: Marcheta Grammes, DPM;  Location: [facility];  Service: Podiatry;  Laterality: Left;   CARPAL TUNNEL RELEASE Bilateral    COLONOSCOPY WITH PROPOFOL N/A 05/02/2021   Procedure: COLONOSCOPY WITH PROPOFOL;  Surgeon: Rogene Houston, MD;  Location: [facility];  Service: Endoscopy;  Laterality: N/A;  930   Disk fusion     lumbar   FLEXOR TENOTOMY  Right 02/18/2018   Procedure: PERCUTANEOUS FLEXOR TENOTOMY/APPLICATION OF BK POSTERIOR SPLINT RLE;  Surgeon: Caprice Beaver, DPM;  Location: [facility];  Service: Podiatry;  Laterality: Right;   FOOT ARTHRODESIS Left 12/29/2013   Procedure: TRIPLE ARTHRODESIS FOOT;  Surgeon: Marcheta Grammes, DPM;  Location: [facility];  Service: Podiatry;  Laterality: Left;   FOOT ARTHRODESIS Right 02/18/2018   Procedure: TRIPLE ARTHRODESIS RIGHT FOOT;  Surgeon: Caprice Beaver, DPM;  Location: [facility];  Service: Podiatry;  Laterality: Right;   KNEE SURGERY Right    Surgery on this knee twice.    POLYPECTOMY  05/02/2021   Procedure: POLYPECTOMY INTESTINAL;  Surgeon: Rogene Houston, MD;  Location: [facility];  Service: Endoscopy;;    Current Facility-Administered Medications  Medication Dose Route Frequency Provider Last Rate Last Admin   bupivacaine-meloxicam ER (ZYNRELEF) injection 400 mg  400 mg Infiltration Once Carole Civil, MD       Current Outpatient  Medications  Medication Sig Dispense Refill Last Dose   celecoxib (CELEBREX) 100 MG capsule Take 1 capsule (100 mg total) by mouth daily.      citalopram (CELEXA) 20 MG tablet Take 20 mg by mouth daily.      Cyanocobalamin (B-12 PO) Take 1 tablet by mouth daily.      glipiZIDE (GLUCOTROL XL) 10 MG 24 hr tablet Take 10 mg by mouth every evening.      hydrochlorothiazide (HYDRODIURIL) 12.5 MG tablet Take 12.5 mg by mouth daily.  3    lisinopril (ZESTRIL) 20 MG tablet Take 20 mg by mouth daily.      metFORMIN (GLUCOPHAGE-XR) 500 MG 24 hr tablet Take 500 mg by mouth daily with supper.      Oxycodone HCl 10 MG TABS Take 5 mg by mouth daily as needed (pain).      pravastatin (PRAVACHOL) 40 MG tablet Take 40 mg by mouth daily.      Allergies  Allergen Reactions   Codeine Nausea Only    Social History   Tobacco Use   Smoking status: Never   Smokeless tobacco: Former    Types: Chew    Quit date: 09/21/2013  Substance Use Topics   Alcohol use: No    Family History  Problem Relation Age of Onset   Healthy Mother    Healthy Father      Review of Systems  Constitutional:  Negative for activity change.  Respiratory: Negative.    Cardiovascular: Negative.   All other systems reviewed and are negative.   Objective:  Physical Exam Constitutional:      General: He is not in acute distress.    Appearance: Normal appearance. He is not toxic-appearing or diaphoretic.  HENT:     Head: Normocephalic and atraumatic.     Right Ear: There is no impacted cerumen.     Nose: No congestion or rhinorrhea.     Mouth/Throat:     Mouth: Mucous membranes are moist.  Eyes:     General: No scleral icterus.       Right eye: No discharge.        Left eye: No discharge.     Pupils: Pupils are equal, round, and reactive to light.  Cardiovascular:     Rate and Rhythm: Normal rate.     Pulses: Normal pulses.  Pulmonary:     Effort: Pulmonary effort is normal.     Breath sounds: Normal breath sounds.   Abdominal:     General: Abdomen is flat. There is no distension.     Palpations: There is no mass.  Musculoskeletal:     Comments: Left knee   Skin clear   Tenderness medial knee joint line   ROM: 0-110  Instability none detected  Motor: 5/5 extension power  Skin:    General: Skin is warm.     Capillary Refill: Capillary refill takes less than 2 seconds.  Neurological:     General: No focal deficit present.     Mental Status: He is alert and oriented to person, place, and time.     Sensory: No sensory deficit.     Coordination: Coordination normal.     Gait: Gait abnormal.     Deep  Tendon Reflexes: Reflexes normal.  Psychiatric:        Mood and Affect: Mood normal.        Behavior: Behavior normal.        Thought Content: Thought content normal.        Judgment: Judgment normal.     Vital signs in last 24 hours:    Labs:   Estimated body mass index is 42.25 kg/m as calculated from the following:   Height as of 04/19/22: 5' 11"$  (1.803 m).   Weight as of 04/19/22: 137.4 kg.   Imaging Review Plain radiographs demonstrate severe degenerative joint disease of the left knee(s). The overall alignment ismild varus. The bone quality appears to be good for age and reported activity level.      Assessment/Plan:  LEFT TOTAL KNEE  End stage arthritis, left knee   The patient history, physical examination, clinical judgment of the provider and imaging studies are consistent with end stage degenerative joint disease of the left knee(s) and total knee arthroplasty is deemed medically necessary. The treatment options including medical management, injection therapy arthroscopy and arthroplasty were discussed at length. The risks and benefits of total knee arthroplasty were presented and reviewed. The risks due to aseptic loosening, infection, stiffness, patella tracking problems, thromboembolic complications and other imponderables were discussed. The patient acknowledged the  explanation, agreed to proceed with the plan and consent was signed. Patient is being admitted for inpatient treatment for surgery, pain control, PT, OT, prophylactic antibiotics, VTE prophylaxis, progressive ambulation and ADL's and discharge planning. The patient is planning to be discharged

## 2022-04-23 ENCOUNTER — Observation Stay (HOSPITAL_COMMUNITY)
Admission: RE | Admit: 2022-04-23 | Discharge: 2022-04-24 | Disposition: A | Discharge status: Home Health | Payer: PPO | Source: Ambulatory Visit | Attending: Orthopedic Surgery | Admitting: Orthopedic Surgery

## 2022-04-23 ENCOUNTER — Other Ambulatory Visit: Payer: Self-pay

## 2022-04-23 ENCOUNTER — Encounter (HOSPITAL_COMMUNITY): Payer: Self-pay | Admitting: Orthopedic Surgery

## 2022-04-23 ENCOUNTER — Ambulatory Visit (HOSPITAL_COMMUNITY): Payer: PPO

## 2022-04-23 ENCOUNTER — Ambulatory Visit (HOSPITAL_BASED_OUTPATIENT_CLINIC_OR_DEPARTMENT_OTHER): Payer: PPO | Admitting: Anesthesiology

## 2022-04-23 ENCOUNTER — Encounter (HOSPITAL_COMMUNITY)
Admission: RE | Disposition: A | Discharge status: Home Health | Payer: Self-pay | Source: Ambulatory Visit | Attending: Orthopedic Surgery

## 2022-04-23 ENCOUNTER — Ambulatory Visit (HOSPITAL_COMMUNITY): Payer: PPO | Admitting: Anesthesiology

## 2022-04-23 DIAGNOSIS — Z79899 Other long term (current) drug therapy: Secondary | ICD-10-CM | POA: Diagnosis not present

## 2022-04-23 DIAGNOSIS — Z7984 Long term (current) use of oral hypoglycemic drugs: Secondary | ICD-10-CM | POA: Diagnosis not present

## 2022-04-23 DIAGNOSIS — I10 Essential (primary) hypertension: Secondary | ICD-10-CM | POA: Diagnosis not present

## 2022-04-23 DIAGNOSIS — M1712 Unilateral primary osteoarthritis, left knee: Secondary | ICD-10-CM | POA: Diagnosis not present

## 2022-04-23 DIAGNOSIS — Z87891 Personal history of nicotine dependence: Secondary | ICD-10-CM | POA: Insufficient documentation

## 2022-04-23 DIAGNOSIS — E119 Type 2 diabetes mellitus without complications: Secondary | ICD-10-CM | POA: Insufficient documentation

## 2022-04-23 HISTORY — PX: TOTAL KNEE ARTHROPLASTY: SHX125

## 2022-04-23 LAB — TYPE AND SCREEN
ABO/RH(D): O POS
Antibody Screen: NEGATIVE
Unit division: 0
Unit division: 0

## 2022-04-23 LAB — BPAM RBC
Blood Product Expiration Date: 202403142359
Blood Product Expiration Date: 202403142359
Unit Type and Rh: 5100
Unit Type and Rh: 5100

## 2022-04-23 LAB — GLUCOSE, CAPILLARY
Glucose-Capillary: 201 mg/dL — ABNORMAL HIGH (ref 70–99)
Glucose-Capillary: 215 mg/dL — ABNORMAL HIGH (ref 70–99)
Glucose-Capillary: 242 mg/dL — ABNORMAL HIGH (ref 70–99)
Glucose-Capillary: 240 mg/dL — ABNORMAL HIGH (ref 70–99)
Glucose-Capillary: 142 mg/dL — ABNORMAL HIGH (ref 70–99)

## 2022-04-23 SURGERY — ARTHROPLASTY, KNEE, TOTAL
Anesthesia: General | Site: Knee | Laterality: Left

## 2022-04-23 MED ORDER — LACTATED RINGERS IV SOLN: INTRAVENOUS | Status: DC

## 2022-04-23 MED ORDER — FENTANYL CITRATE (PF) 250 MCG/5ML IJ SOLN
INTRAMUSCULAR | Status: AC
  Filled 2022-04-23: qty 5

## 2022-04-23 MED ORDER — 0.9 % SODIUM CHLORIDE (POUR BTL) OPTIME
TOPICAL | Status: DC | PRN
  Administered 2022-04-23: 1000 mL

## 2022-04-23 MED ORDER — PROPOFOL 10 MG/ML IV BOLUS
INTRAVENOUS | Status: AC
  Filled 2022-04-23: qty 20

## 2022-04-23 MED ORDER — BUPIVACAINE-MELOXICAM ER 200-6 MG/7ML IJ SOLN
INTRAMUSCULAR | Status: DC | PRN
  Administered 2022-04-23: 400 mg

## 2022-04-23 MED ORDER — POVIDONE-IODINE 10 % EX SWAB: 2.0000 | Freq: Once | CUTANEOUS | Status: DC

## 2022-04-23 MED ORDER — HYDROCHLOROTHIAZIDE 12.5 MG PO TABS
12.5000 mg | ORAL_TABLET | Freq: Every day | ORAL | Status: DC
  Administered 2022-04-23 – 2022-04-24 (×2): 12.5 mg via ORAL
  Filled 2022-04-23 (×2): qty 1

## 2022-04-23 MED ORDER — METHOCARBAMOL 1000 MG/10ML IJ SOLN: 500.0000 mg | Freq: Four times a day (QID) | INTRAVENOUS | Status: DC | PRN

## 2022-04-23 MED ORDER — TRANEXAMIC ACID-NACL 1000-0.7 MG/100ML-% IV SOLN
1000.0000 mg | Freq: Once | INTRAVENOUS | Status: AC
  Administered 2022-04-23: 1000 mg via INTRAVENOUS
  Filled 2022-04-23: qty 100

## 2022-04-23 MED ORDER — ALUM & MAG HYDROXIDE-SIMETH 200-200-20 MG/5ML PO SUSP: 30.0000 mL | ORAL | Status: DC | PRN

## 2022-04-23 MED ORDER — ONDANSETRON HCL 4 MG/2ML IJ SOLN: 4.0000 mg | Freq: Four times a day (QID) | INTRAMUSCULAR | Status: DC | PRN

## 2022-04-23 MED ORDER — CITALOPRAM HYDROBROMIDE 20 MG PO TABS
20.0000 mg | ORAL_TABLET | Freq: Every day | ORAL | Status: DC
  Administered 2022-04-23 – 2022-04-24 (×2): 20 mg via ORAL
  Filled 2022-04-23 (×2): qty 1

## 2022-04-23 MED ORDER — METOCLOPRAMIDE HCL 10 MG PO TABS: 5.0000 mg | ORAL_TABLET | Freq: Three times a day (TID) | ORAL | Status: DC | PRN

## 2022-04-23 MED ORDER — FENTANYL CITRATE (PF) 100 MCG/2ML IJ SOLN
INTRAMUSCULAR | Status: AC
  Filled 2022-04-23: qty 2

## 2022-04-23 MED ORDER — OXYCODONE HCL 5 MG/5ML PO SOLN: 5.0000 mg | Freq: Once | ORAL | Status: DC | PRN

## 2022-04-23 MED ORDER — STERILE WATER FOR IRRIGATION IR SOLN
Status: DC | PRN
  Administered 2022-04-23: 1000 mL

## 2022-04-23 MED ORDER — VITAMIN B-12 1000 MCG PO TABS
1000.0000 ug | ORAL_TABLET | Freq: Every day | ORAL | Status: DC
  Administered 2022-04-24: 1000 ug via ORAL
  Filled 2022-04-23: qty 1

## 2022-04-23 MED ORDER — ACETAMINOPHEN 500 MG PO TABS
1000.0000 mg | ORAL_TABLET | Freq: Four times a day (QID) | ORAL | Status: AC
  Administered 2022-04-23 – 2022-04-24 (×4): 1000 mg via ORAL
  Filled 2022-04-23 (×4): qty 2

## 2022-04-23 MED ORDER — INSULIN ASPART 100 UNIT/ML IJ SOLN: 0.0000 [IU] | Freq: Every day | INTRAMUSCULAR | Status: DC

## 2022-04-23 MED ORDER — CEFAZOLIN SODIUM-DEXTROSE 2-4 GM/100ML-% IV SOLN
2.0000 g | Freq: Four times a day (QID) | INTRAVENOUS | Status: AC
  Administered 2022-04-23 (×2): 2 g via INTRAVENOUS
  Filled 2022-04-23 (×2): qty 100

## 2022-04-23 MED ORDER — ROPIVACAINE HCL 5 MG/ML IJ SOLN
INTRAMUSCULAR | Status: AC
  Filled 2022-04-23: qty 30

## 2022-04-23 MED ORDER — PROPOFOL 500 MG/50ML IV EMUL
INTRAVENOUS | Status: AC
  Filled 2022-04-23: qty 50

## 2022-04-23 MED ORDER — SODIUM CHLORIDE 0.9 % IR SOLN
Status: DC | PRN
  Administered 2022-04-23: 3000 mL

## 2022-04-23 MED ORDER — CEFAZOLIN IN SODIUM CHLORIDE 3-0.9 GM/100ML-% IV SOLN
3.0000 g | INTRAVENOUS | Status: AC
  Administered 2022-04-23: 3 g via INTRAVENOUS
  Filled 2022-04-23: qty 100

## 2022-04-23 MED ORDER — PANTOPRAZOLE SODIUM 40 MG PO TBEC
40.0000 mg | DELAYED_RELEASE_TABLET | Freq: Every day | ORAL | Status: DC
  Administered 2022-04-23 – 2022-04-24 (×2): 40 mg via ORAL
  Filled 2022-04-23 (×2): qty 1

## 2022-04-23 MED ORDER — ONDANSETRON HCL 4 MG/2ML IJ SOLN
4.0000 mg | Freq: Once | INTRAMUSCULAR | Status: AC | PRN
  Administered 2022-04-23: 4 mg via INTRAVENOUS

## 2022-04-23 MED ORDER — ACETAMINOPHEN 325 MG PO TABS: 325.0000 mg | ORAL_TABLET | Freq: Four times a day (QID) | ORAL | Status: DC | PRN

## 2022-04-23 MED ORDER — TRANEXAMIC ACID-NACL 1000-0.7 MG/100ML-% IV SOLN
1000.0000 mg | INTRAVENOUS | Status: AC
  Administered 2022-04-23: 1000 mg via INTRAVENOUS
  Filled 2022-04-23: qty 100

## 2022-04-23 MED ORDER — ONDANSETRON HCL 4 MG/2ML IJ SOLN
4.0000 mg | Freq: Once | INTRAMUSCULAR | Status: DC
  Filled 2022-04-23: qty 2

## 2022-04-23 MED ORDER — ONDANSETRON HCL 4 MG PO TABS: 4.0000 mg | ORAL_TABLET | Freq: Four times a day (QID) | ORAL | Status: DC | PRN

## 2022-04-23 MED ORDER — ROCURONIUM BROMIDE 100 MG/10ML IV SOLN
INTRAVENOUS | Status: DC | PRN
  Administered 2022-04-23: 10 mg via INTRAVENOUS
  Administered 2022-04-23: 20 mg via INTRAVENOUS
  Administered 2022-04-23: 80 mg via INTRAVENOUS

## 2022-04-23 MED ORDER — LIDOCAINE HCL (CARDIAC) PF 50 MG/5ML IV SOSY
PREFILLED_SYRINGE | INTRAVENOUS | Status: DC | PRN
  Administered 2022-04-23: 60 mg via INTRAVENOUS

## 2022-04-23 MED ORDER — DEXMEDETOMIDINE HCL IN NACL 80 MCG/20ML IV SOLN
INTRAVENOUS | Status: DC | PRN
  Administered 2022-04-23 (×2): 8 ug via BUCCAL

## 2022-04-23 MED ORDER — OXYCODONE HCL 5 MG PO TABS: 5.0000 mg | ORAL_TABLET | Freq: Once | ORAL | Status: DC | PRN

## 2022-04-23 MED ORDER — DOCUSATE SODIUM 100 MG PO CAPS
100.0000 mg | ORAL_CAPSULE | Freq: Two times a day (BID) | ORAL | Status: DC
  Administered 2022-04-23 – 2022-04-24 (×2): 100 mg via ORAL
  Filled 2022-04-23 (×2): qty 1

## 2022-04-23 MED ORDER — METHOCARBAMOL 500 MG PO TABS
500.0000 mg | ORAL_TABLET | Freq: Four times a day (QID) | ORAL | Status: DC | PRN
  Administered 2022-04-23 – 2022-04-24 (×2): 500 mg via ORAL
  Filled 2022-04-23 (×2): qty 1

## 2022-04-23 MED ORDER — OXYCODONE HCL 5 MG PO TABS
5.0000 mg | ORAL_TABLET | Freq: Once | ORAL | Status: AC
  Administered 2022-04-23: 5 mg via ORAL
  Filled 2022-04-23: qty 1

## 2022-04-23 MED ORDER — PRAVASTATIN SODIUM 40 MG PO TABS
40.0000 mg | ORAL_TABLET | Freq: Every day | ORAL | Status: DC
  Administered 2022-04-23 – 2022-04-24 (×2): 40 mg via ORAL
  Filled 2022-04-23 (×2): qty 1

## 2022-04-23 MED ORDER — FENTANYL CITRATE PF 50 MCG/ML IJ SOSY
25.0000 ug | PREFILLED_SYRINGE | INTRAMUSCULAR | Status: DC | PRN
  Administered 2022-04-23 (×3): 50 ug via INTRAVENOUS
  Filled 2022-04-23 (×3): qty 1

## 2022-04-23 MED ORDER — POLYETHYLENE GLYCOL 3350 17 G PO PACK: 17.0000 g | PACK | Freq: Every day | ORAL | Status: DC | PRN

## 2022-04-23 MED ORDER — CHLORHEXIDINE GLUCONATE 0.12 % MT SOLN
15.0000 mL | Freq: Once | OROMUCOSAL | Status: AC
  Administered 2022-04-23: 15 mL via OROMUCOSAL

## 2022-04-23 MED ORDER — LISINOPRIL 10 MG PO TABS
20.0000 mg | ORAL_TABLET | Freq: Every day | ORAL | Status: DC
  Administered 2022-04-23 – 2022-04-24 (×2): 20 mg via ORAL
  Filled 2022-04-23 (×2): qty 2

## 2022-04-23 MED ORDER — ROCURONIUM BROMIDE 10 MG/ML (PF) SYRINGE
PREFILLED_SYRINGE | INTRAVENOUS | Status: AC
  Filled 2022-04-23: qty 20

## 2022-04-23 MED ORDER — PHENYLEPHRINE 80 MCG/ML (10ML) SYRINGE FOR IV PUSH (FOR BLOOD PRESSURE SUPPORT)
PREFILLED_SYRINGE | INTRAVENOUS | Status: AC
  Filled 2022-04-23: qty 10

## 2022-04-23 MED ORDER — BUPIVACAINE-MELOXICAM ER 200-6 MG/7ML IJ SOLN
INTRAMUSCULAR | Status: AC
  Filled 2022-04-23: qty 2

## 2022-04-23 MED ORDER — PROPOFOL 10 MG/ML IV BOLUS
INTRAVENOUS | Status: DC | PRN
  Administered 2022-04-23: 200 mg via INTRAVENOUS

## 2022-04-23 MED ORDER — GLIPIZIDE ER 5 MG PO TB24
10.0000 mg | ORAL_TABLET | Freq: Every day | ORAL | Status: DC
  Administered 2022-04-23: 10 mg via ORAL
  Filled 2022-04-23: qty 2

## 2022-04-23 MED ORDER — INSULIN ASPART 100 UNIT/ML IJ SOLN
0.0000 [IU] | Freq: Three times a day (TID) | INTRAMUSCULAR | Status: DC
  Administered 2022-04-23: 3 [IU] via SUBCUTANEOUS
  Administered 2022-04-24 (×2): 2 [IU] via SUBCUTANEOUS

## 2022-04-23 MED ORDER — HYDROMORPHONE HCL 1 MG/ML IJ SOLN
0.5000 mg | INTRAMUSCULAR | Status: DC | PRN
  Administered 2022-04-23: 1 mg via INTRAVENOUS
  Filled 2022-04-23: qty 1

## 2022-04-23 MED ORDER — ONDANSETRON HCL 4 MG/2ML IJ SOLN
INTRAMUSCULAR | Status: AC
  Filled 2022-04-23: qty 2

## 2022-04-23 MED ORDER — ASPIRIN 325 MG PO TBEC
325.0000 mg | DELAYED_RELEASE_TABLET | Freq: Every day | ORAL | Status: DC
  Administered 2022-04-24: 325 mg via ORAL
  Filled 2022-04-23: qty 1

## 2022-04-23 MED ORDER — SUGAMMADEX SODIUM 500 MG/5ML IV SOLN
INTRAVENOUS | Status: DC | PRN
  Administered 2022-04-23: 400 mg via INTRAVENOUS

## 2022-04-23 MED ORDER — DEXMEDETOMIDINE HCL IN NACL 80 MCG/20ML IV SOLN
INTRAVENOUS | Status: AC
  Filled 2022-04-23: qty 20

## 2022-04-23 MED ORDER — METOCLOPRAMIDE HCL 5 MG/ML IJ SOLN: 5.0000 mg | Freq: Three times a day (TID) | INTRAMUSCULAR | Status: DC | PRN

## 2022-04-23 MED ORDER — DIPHENHYDRAMINE HCL 12.5 MG/5ML PO ELIX: 12.5000 mg | ORAL_SOLUTION | ORAL | Status: DC | PRN

## 2022-04-23 MED ORDER — ORAL CARE MOUTH RINSE: 15.0000 mL | Freq: Once | OROMUCOSAL | Status: AC

## 2022-04-23 MED ORDER — SODIUM CHLORIDE 0.9 % IV SOLN: INTRAVENOUS | Status: DC

## 2022-04-23 MED ORDER — HYDROMORPHONE HCL 2 MG PO TABS
1.0000 mg | ORAL_TABLET | ORAL | Status: DC | PRN
  Administered 2022-04-23 – 2022-04-24 (×3): 2 mg via ORAL
  Filled 2022-04-23 (×3): qty 1

## 2022-04-23 MED ORDER — METFORMIN HCL ER 500 MG PO TB24
500.0000 mg | ORAL_TABLET | Freq: Every day | ORAL | Status: DC
  Filled 2022-04-23: qty 1

## 2022-04-23 MED ORDER — PREGABALIN 50 MG PO CAPS
50.0000 mg | ORAL_CAPSULE | Freq: Once | ORAL | Status: AC
  Administered 2022-04-23: 50 mg via ORAL
  Filled 2022-04-23: qty 1

## 2022-04-23 MED ORDER — SURGIRINSE WOUND IRRIGATION SYSTEM - OPTIME
TOPICAL | Status: DC | PRN
  Administered 2022-04-23: 450 mL via TOPICAL

## 2022-04-23 MED ORDER — PHENYLEPHRINE HCL (PRESSORS) 10 MG/ML IV SOLN
INTRAVENOUS | Status: DC | PRN
  Administered 2022-04-23: 80 ug via INTRAVENOUS

## 2022-04-23 MED ORDER — PHENOL 1.4 % MT LIQD
1.0000 | OROMUCOSAL | Status: DC | PRN
  Filled 2022-04-23: qty 177

## 2022-04-23 MED ORDER — ONDANSETRON HCL 4 MG/2ML IJ SOLN
INTRAMUSCULAR | Status: DC | PRN
  Administered 2022-04-23: 4 mg via INTRAVENOUS

## 2022-04-23 MED ORDER — FENTANYL CITRATE (PF) 100 MCG/2ML IJ SOLN
INTRAMUSCULAR | Status: DC | PRN
  Administered 2022-04-23 (×3): 50 ug via INTRAVENOUS
  Administered 2022-04-23 (×2): 100 ug via INTRAVENOUS
  Administered 2022-04-23: 50 ug via INTRAVENOUS
  Administered 2022-04-23 (×2): 100 ug via INTRAVENOUS

## 2022-04-23 MED ORDER — PROPOFOL 500 MG/50ML IV EMUL
INTRAVENOUS | Status: DC | PRN
  Administered 2022-04-23: 50 ug/kg/min via INTRAVENOUS
  Administered 2022-04-23: 25 ug/kg/min via INTRAVENOUS

## 2022-04-23 MED ORDER — LIDOCAINE HCL (PF) 2 % IJ SOLN
INTRAMUSCULAR | Status: AC
  Filled 2022-04-23: qty 5

## 2022-04-23 MED ORDER — SEVOFLURANE IN SOLN
RESPIRATORY_TRACT | Status: AC
  Filled 2022-04-23: qty 250

## 2022-04-23 MED ORDER — TRAMADOL HCL 50 MG PO TABS
50.0000 mg | ORAL_TABLET | Freq: Four times a day (QID) | ORAL | Status: DC
  Administered 2022-04-23 – 2022-04-24 (×5): 50 mg via ORAL
  Filled 2022-04-23 (×5): qty 1

## 2022-04-23 MED ORDER — METHOCARBAMOL 1000 MG/10ML IJ SOLN
500.0000 mg | Freq: Once | INTRAVENOUS | Status: AC
  Administered 2022-04-23: 500 mg via INTRAVENOUS
  Filled 2022-04-23: qty 5

## 2022-04-23 MED ORDER — MENTHOL 3 MG MT LOZG: 1.0000 | LOZENGE | OROMUCOSAL | Status: DC | PRN

## 2022-04-23 SURGICAL SUPPLY — 63 items
ATTUNE MED DOME PAT 38 KNEE (Knees) IMPLANT
ATTUNE PS FEM LT SZ 6 CEM KNEE (Femur) IMPLANT
BANDAGE ESMARK 6X9 LF (GAUZE/BANDAGES/DRESSINGS) ×2 IMPLANT
BASE TIBIAL CEM ATTUNE SZ 7 (Knees) ×1 IMPLANT
BASEPLATE TIB CEM ATTUNE SZ7 (Knees) IMPLANT
BLADE SAGITTAL 25.0X1.27X90 (BLADE) ×2 IMPLANT
BLADE SAW SGTL 11.0X1.19X90.0M (BLADE) ×2 IMPLANT
BLADE SURG SZ10 CARB STEEL (BLADE) ×2 IMPLANT
BNDG CMPR 9X6 STRL LF SNTH (GAUZE/BANDAGES/DRESSINGS) ×1
BNDG ESMARK 6X9 LF (GAUZE/BANDAGES/DRESSINGS) ×1
BSPLAT TIB 7 CMNT FX BRNG STRL (Knees) ×1 IMPLANT
CEMENT HV SMART SET (Cement) ×4 IMPLANT
CLOTH BEACON ORANGE TIMEOUT ST (SAFETY) ×2 IMPLANT
COOLER ICEMAN CLASSIC (MISCELLANEOUS) ×2 IMPLANT
COVER LIGHT HANDLE STERIS (MISCELLANEOUS) ×4 IMPLANT
CUFF TOURN SGL QUICK 34 (TOURNIQUET CUFF) ×1
CUFF TRNQT CYL 34X4.125X (TOURNIQUET CUFF) ×2 IMPLANT
DRAPE BACK TABLE (DRAPES) ×2 IMPLANT
DRAPE EXTREMITY T 121X128X90 (DISPOSABLE) ×2 IMPLANT
DRESSING AQUACEL AG ADV 3.5X12 (MISCELLANEOUS) ×2 IMPLANT
DRSG AQUACEL AG ADV 3.5X12 (MISCELLANEOUS) ×1
DURAPREP 26ML APPLICATOR (WOUND CARE) ×4 IMPLANT
ELECT REM PT RETURN 9FT ADLT (ELECTROSURGICAL) ×1
ELECTRODE REM PT RTRN 9FT ADLT (ELECTROSURGICAL) ×2 IMPLANT
GLOVE BIO SURGEON STRL SZ7 (GLOVE) IMPLANT
GLOVE BIOGEL PI IND STRL 6 (GLOVE) IMPLANT
GLOVE BIOGEL PI IND STRL 6.5 (GLOVE) IMPLANT
GLOVE BIOGEL PI IND STRL 7.0 (GLOVE) ×6 IMPLANT
GLOVE BIOGEL PI IND STRL 7.5 (GLOVE) IMPLANT
GLOVE BIOGEL PI IND STRL 8.5 (GLOVE) ×2 IMPLANT
GLOVE ECLIPSE 6.5 STRL STRAW (GLOVE) IMPLANT
GLOVE SKINSENSE STRL SZ8.0 LF (GLOVE) ×2 IMPLANT
GOWN STRL REUS W/TWL LRG LVL3 (GOWN DISPOSABLE) ×6 IMPLANT
GOWN STRL REUS W/TWL XL LVL3 (GOWN DISPOSABLE) ×2 IMPLANT
HANDPIECE INTERPULSE COAX TIP (DISPOSABLE) ×1
HOOD W/PEELAWAY (MISCELLANEOUS) ×8 IMPLANT
INSERT TIB ATTUNE FB SZ6X7 (Insert) IMPLANT
INST SET MAJOR BONE (KITS) ×2 IMPLANT
IV NS IRRIG 3000ML ARTHROMATIC (IV SOLUTION) ×2 IMPLANT
KIT BLADEGUARD II DBL (SET/KITS/TRAYS/PACK) ×2 IMPLANT
KIT TURNOVER KIT A (KITS) ×2 IMPLANT
MANIFOLD NEPTUNE II (INSTRUMENTS) ×2 IMPLANT
MARKER SKIN DUAL TIP RULER LAB (MISCELLANEOUS) ×2 IMPLANT
NS IRRIG 1000ML POUR BTL (IV SOLUTION) ×2 IMPLANT
PACK TOTAL JOINT (CUSTOM PROCEDURE TRAY) ×2 IMPLANT
PAD ARMBOARD 7.5X6 YLW CONV (MISCELLANEOUS) ×2 IMPLANT
PAD COLD SHLDR SM WRAP-ON (PAD) ×2 IMPLANT
PILLOW KNEE EXTENSION 0 DEG (MISCELLANEOUS) ×2 IMPLANT
SAW OSC TIP CART 19.5X105X1.3 (SAW) ×2 IMPLANT
SET BASIN LINEN APH (SET/KITS/TRAYS/PACK) ×2 IMPLANT
SET HNDPC FAN SPRY TIP SCT (DISPOSABLE) ×2 IMPLANT
SOLUTION IRRIG SURGIPHOR (IV SOLUTION) IMPLANT
STAPLER VISISTAT 35W (STAPLE) ×2 IMPLANT
SUT BRALON NAB BRD #1 30IN (SUTURE) ×2 IMPLANT
SUT MNCRL 0 VIOLET CTX 36 (SUTURE) ×2 IMPLANT
SUT MON AB 0 CT1 (SUTURE) ×2 IMPLANT
SUT MONOCRYL 0 CTX 36 (SUTURE) ×2
SYR BULB IRRIG 60ML STRL (SYRINGE) ×2 IMPLANT
TOWEL OR 17X26 4PK STRL BLUE (TOWEL DISPOSABLE) ×2 IMPLANT
TOWER CARTRIDGE SMART MIX (DISPOSABLE) ×2 IMPLANT
TRAY FOLEY MTR SLVR 16FR STAT (SET/KITS/TRAYS/PACK) ×2 IMPLANT
WATER STERILE IRR 1000ML POUR (IV SOLUTION) ×4 IMPLANT
YANKAUER SUCT 12FT TUBE ARGYLE (SUCTIONS) ×2 IMPLANT

## 2022-04-23 NOTE — Plan of Care (Signed)

## 2022-04-23 NOTE — Brief Op Note (Signed)
04/23/2022  9:51 AM  PATIENT:  Ethan Oliver  67 y.o. male  PRE-OPERATIVE DIAGNOSIS:  left knee osteoarthritis  POST-OPERATIVE DIAGNOSIS:  left knee osteoarthritis  PROCEDURE:  Procedure(s): TOTAL KNEE ARTHROPLASTY (Left)  SURGEON:  Surgeon(s) and Role:    Carole Civil, MD - Primary  PHYSICIAN ASSISTANT:   ASSISTANTS: nicki cox    ANESTHESIA:   general  EBL:  20 mL   BLOOD ADMINISTERED:none  DRAINS: none   LOCAL MEDICATIONS USED:  OTHER ZINRELEF  SPECIMEN:  No Specimen  DISPOSITION OF SPECIMEN:  N/A  COUNTS:  YES  TOURNIQUET:   Total Tourniquet Time Documented: Thigh (Left) - 105 minutes Total: Thigh (Left) - 105 minutes   DICTATION: .Viviann Spare Dictation  PLAN OF CARE: Admit for overnight observation  PATIENT DISPOSITION:  PACU - hemodynamically stable.   Delay start of Pharmacological VTE agent (>24hrs) due to surgical blood loss or risk of bleeding: not applicable

## 2022-04-23 NOTE — Anesthesia Preprocedure Evaluation (Signed)
Anesthesia Evaluation  Patient identified by MRN, date of birth, ID band Patient awake    Reviewed: Allergy & Precautions, H&P , NPO status , Patient's Chart, lab work & pertinent test results, reviewed documented beta blocker date and time   Airway Mallampati: II  TM Distance: >3 FB Neck ROM: full    Dental no notable dental hx.    Pulmonary neg pulmonary ROS   Pulmonary exam normal breath sounds clear to auscultation       Cardiovascular Exercise Tolerance: Good hypertension, negative cardio ROS  Rhythm:regular Rate:Normal     Neuro/Psych  PSYCHIATRIC DISORDERS  Depression     Neuromuscular disease negative neurological ROS  negative psych ROS   GI/Hepatic negative GI ROS, Neg liver ROS,,,  Endo/Other  negative endocrine ROSdiabetes    Renal/GU negative Renal ROS  negative genitourinary   Musculoskeletal   Abdominal   Peds  Hematology negative hematology ROS (+)   Anesthesia Other Findings   Reproductive/Obstetrics negative OB ROS                             Anesthesia Physical Anesthesia Plan  ASA: 2  Anesthesia Plan: Spinal   Post-op Pain Management: Regional block*   Induction:   PONV Risk Score and Plan: Propofol infusion  Airway Management Planned:   Additional Equipment:   Intra-op Plan:   Post-operative Plan:   Informed Consent: I have reviewed the patients History and Physical, chart, labs and discussed the procedure including the risks, benefits and alternatives for the proposed anesthesia with the patient or authorized representative who has indicated his/her understanding and acceptance.     Dental Advisory Given  Plan Discussed with: CRNA  Anesthesia Plan Comments:        Anesthesia Quick Evaluation

## 2022-04-23 NOTE — Plan of Care (Signed)
  Problem: Acute Rehab PT Goals(only PT should resolve) Goal: Pt Will Go Supine/Side To Sit Outcome: Progressing Flowsheets (Taken 04/23/2022 1516) Pt will go Supine/Side to Sit:  with modified independence  with supervision Goal: Patient Will Transfer Sit To/From Stand Outcome: Progressing Flowsheets (Taken 04/23/2022 1516) Patient will transfer sit to/from stand:  with modified independence  with supervision Goal: Pt Will Transfer Bed To Chair/Chair To Bed Outcome: Progressing Flowsheets (Taken 04/23/2022 1516) Pt will Transfer Bed to Chair/Chair to Bed:  with modified independence  with supervision Goal: Pt Will Ambulate Outcome: Progressing Flowsheets (Taken 04/23/2022 1516) Pt will Ambulate:  100 feet  with modified independence  with supervision  with rolling walker   3:17 PM, 04/23/22 Lonell Grandchild, MPT Physical Therapist with [facility] [phone] office [phone] mobile phone

## 2022-04-23 NOTE — Evaluation (Signed)
Physical Therapy Evaluation Patient Details Name: Ethan Oliver MRN: [id number] DOB: 1956-02-24 Today's Date: 04/23/2022  LEFT KNEE ROM:  0 - 100 degrees AMBULATION DISTANCE:  60  feet using RW with Supervision/Min guard assist  History of Present Illness  Ethan Oliver is a 67 y/o male, s/p Left TKA on 04/23/22 with the diagnosis of left knee osteoarthritis.  Clinical Impression  Patient instructed in and issued HEP with good return demonstrated, had difficulty for completing sit to stands from bed side due to increased left knee pain, able to ambulate in hallway with slow labored cadence without loss of balance and tolerated sitting up in chair with LLE dangling after therapy.  Patient will benefit from continued skilled physical therapy in hospital and recommended venue below to increase strength, balance, endurance for safe ADLs and gait.        Recommendations for follow up therapy are one component of a multi-disciplinary discharge planning process, led by the attending physician.  Recommendations may be updated based on patient status, additional functional criteria and insurance authorization.  Follow Up Recommendations Home health PT      Assistance Recommended at Discharge Set up Supervision/Assistance  Patient can return home with the following  A little help with walking and/or transfers;A little help with bathing/dressing/bathroom;Help with stairs or ramp for entrance;Assistance with cooking/housework    Equipment Recommendations None recommended by PT  Recommendations for Other Services       Functional Status Assessment Patient has had a recent decline in their functional status and demonstrates the ability to make significant improvements in function in a reasonable and predictable amount of time.     Precautions / Restrictions Precautions Precautions: Fall Restrictions Weight Bearing Restrictions: Yes LLE Weight Bearing: Weight bearing as tolerated       Mobility  Bed Mobility Overal bed mobility: Needs Assistance Bed Mobility: Supine to Sit     Supine to sit: Min assist     General bed mobility comments: increased time, labored movement with HOB flat    Transfers Overall transfer level: Needs assistance Equipment used: Rolling walker (2 wheels) Transfers: Sit to/from Stand, Bed to chair/wheelchair/BSC Sit to Stand: Min assist   Step pivot transfers: Supervision, Min guard       General transfer comment: had most difficulty completing sit to stand from bed side, good return for transferring to chair    Ambulation/Gait Ambulation/Gait assistance: Supervision, Min guard Gait Distance (Feet): 60 Feet Assistive device: Rolling walker (2 wheels) Gait Pattern/deviations: Decreased step length - right, Decreased step length - left, Decreased stride length, Decreased stance time - left, Antalgic Gait velocity: decreased     General Gait Details: slow labored cadence with fair carryover for left heel to toe stepping after verbal cues, no loss of balance, limited mostly due to fatigue and left knee pain  Stairs            Wheelchair Mobility    Modified Rankin (Stroke Patients Only)       Balance Overall balance assessment: Needs assistance Sitting-balance support: Feet supported, No upper extremity supported Sitting balance-Leahy Scale: Fair Sitting balance - Comments: fair/good seated at EOB   Standing balance support: During functional activity, Bilateral upper extremity supported Standing balance-Leahy Scale: Fair Standing balance comment: fair/good using RW                             Pertinent Vitals/Pain Pain Assessment Pain Assessment: Faces Faces Pain  Scale: Hurts even more Pain Location: left knee Pain Descriptors / Indicators: Sore, Grimacing, Discomfort Pain Intervention(s): Limited activity within patient's tolerance, Monitored during session, Premedicated before session, Patient  requesting pain meds-RN notified    Home Living Family/patient expects to be discharged to:: Private residence Living Arrangements: Spouse/significant other Available Help at Discharge: Family;Available 24 hours/day Type of Home: House Home Access: Stairs to enter Entrance Stairs-Rails: None Entrance Stairs-Number of Steps: 2   Home Layout: One level Home Equipment: Conservation officer, nature (2 wheels);Shower seat;Cane - single point      Prior Function Prior Level of Function : Independent/Modified Independent             Mobility Comments: Community ambulator without AD, drives ADLs Comments: Independent     Hand Dominance   Dominant Hand: Right    Extremity/Trunk Assessment   Upper Extremity Assessment Upper Extremity Assessment: Overall WFL for tasks assessed    Lower Extremity Assessment Lower Extremity Assessment: Overall WFL for tasks assessed;LLE deficits/detail LLE Deficits / Details: grossly -4/5 LLE: Unable to fully assess due to pain LLE Sensation: WNL LLE Coordination: WNL    Cervical / Trunk Assessment Cervical / Trunk Assessment: Normal  Communication   Communication: No difficulties  Cognition Arousal/Alertness: Awake/alert Behavior During Therapy: WFL for tasks assessed/performed Overall Cognitive Status: Within Functional Limits for tasks assessed                                          General Comments      Exercises Total Joint Exercises Ankle Circles/Pumps: Supine, 10 reps, Left, Strengthening, AROM Quad Sets: AROM, Strengthening, Left, 10 reps, Supine Short Arc Quad: AROM, Strengthening, Left, 10 reps, Supine Heel Slides: AROM, Strengthening, Left, 10 reps, Supine Goniometric ROM: Left knee ROM: 0 - 100 degrees   Assessment/Plan    PT Assessment Patient needs continued PT services  PT Problem List Decreased strength;Decreased activity tolerance;Decreased balance;Decreased range of motion;Decreased mobility;Pain        PT Treatment Interventions DME instruction;Gait training;Stair training;Functional mobility training;Therapeutic activities;Therapeutic exercise;Patient/family education;Balance training    PT Goals (Current goals can be found in the Care Plan section)  Acute Rehab PT Goals Patient Stated Goal: return home with family to assist PT Goal Formulation: With patient/family Time For Goal Achievement: 04/25/22 Potential to Achieve Goals: Good    Frequency BID     Co-evaluation               AM-PAC PT "6 Clicks" Mobility  Outcome Measure Help needed turning from your back to your side while in a flat bed without using bedrails?: A Little Help needed moving from lying on your back to sitting on the side of a flat bed without using bedrails?: A Little Help needed moving to and from a bed to a chair (including a wheelchair)?: A Little Help needed standing up from a chair using your arms (e.g., wheelchair or bedside chair)?: A Little Help needed to walk in hospital room?: A Little Help needed climbing 3-5 steps with a railing? : A Lot 6 Click Score: 17    End of Session   Activity Tolerance: Patient tolerated treatment well;Patient limited by fatigue;Patient limited by pain Patient left: in chair;with call bell/phone within reach Nurse Communication: Mobility status PT Visit Diagnosis: Unsteadiness on feet (R26.81);Other abnormalities of gait and mobility (R26.89);Muscle weakness (generalized) (M62.81)    Time: 1428-1500 PT Time Calculation (  min) (ACUTE ONLY): 32 min   Charges:   PT Evaluation $PT Eval Moderate Complexity: 1 Mod PT Treatments $Therapeutic Activity: 23-37 mins        3:15 PM, 04/23/22 Lonell Grandchild, MPT Physical Therapist with [facility] [phone] office [phone] mobile phone

## 2022-04-23 NOTE — Interval H&P Note (Signed)
History and Physical Interval Note:  04/23/2022 7:14 AM  Ethan Oliver  has presented today for surgery, with the diagnosis of left knee osteoarthritis.  The various methods of treatment have been discussed with the patient and family. After consideration of risks, benefits and other options for treatment, the patient has consented to  Procedure(s): TOTAL KNEE ARTHROPLASTY (Left) as a surgical intervention.  The patient's history has been reviewed, patient examined, no change in status, stable for surgery.  I have reviewed the patient's chart and labs.  Questions were answered to the patient's satisfaction.     Arther Abbott

## 2022-04-23 NOTE — Transfer of Care (Signed)
Immediate Anesthesia Transfer of Care Note  Patient: Ethan Oliver  Procedure(s) Performed: TOTAL KNEE ARTHROPLASTY (Left: Knee)  Patient Location: PACU  Anesthesia Type:General  Level of Consciousness: awake and patient cooperative  Airway & Oxygen Therapy: Patient Spontanous Breathing and Patient connected to nasal cannula oxygen  Post-op Assessment: Report given to RN, Post -op Vital signs reviewed and stable, and Patient moving all extremities X 4  Post vital signs: Reviewed and stable  Last Vitals:  Vitals Value Taken Time  BP 148/100 1000  Temp 98.5 0959  Pulse 74 04/23/22 0958  Resp 15 04/23/22 0958  SpO2 89 % 04/23/22 0958  Vitals shown include unvalidated device data.  Last Pain:  Vitals:   04/23/22 0719  TempSrc:   PainSc: 5       Patients Stated Pain Goal: 8 ([id number])  Complications: No notable events documented.

## 2022-04-23 NOTE — Op Note (Signed)
Orthopaedic Surgery Operative Note (CSN: [id number])  Ethan Oliver  1955/06/11 Date of Surgery: 04/23/2022   Diagnoses:  left knee osteoarthritis  Procedure: LEFT TOTAL KNEE ARTHROPLASTY    Operative Finding Successful completion of the planned procedure.    Severe arthritis left knee with mild varus deformity.  He did not have a lot of osteophytes just a few.  The superior patella was severely worn as was the medial compartment there was no ACL and the notch was stenotic   Post-Op Diagnosis: Same Surgeons:Primary: Carole Civil, MD Assistants: Franklyn Lor Location: [facility] OR ROOM 4 Anesthesia: General postop saphenous nerve block in PACU Antibiotics: Ancef 3 g Tourniquet time:  Total Tourniquet Time Documented: Thigh (Left) - 105 minutes Total: Thigh (Left) - 105 minutes  Estimated Blood Loss: [id number] Complications: None Specimens: None Implants: Implant Name Type Inv. Item Serial No. Manufacturer Lot No. LRB No. Used Action  CEMENT HV SMART SET - [id number] Cement CEMENT HV SMART SET  [employer] [id number] Left 1 Implanted  CEMENT HV SMART SET - [id number] Cement CEMENT HV SMART SET  [employer] [id number] Left 1 Implanted  ATTUNE MED DOME PAT 38 KNEE - [id number] Knees ATTUNE MED DOME PAT 38 KNEE  [employer] [id number] Left 1 Implanted  INSERT TIB ATTUNE FB [id number] - [id number] Insert INSERT TIB ATTUNE FB [id number]  [employer] [id number] Left 1 Implanted  BASE TIBIAL CEM ATTUNE SZ 7 - [id number] Knees BASE TIBIAL CEM ATTUNE SZ 7  [employer]TH[employer] [id number] Left 1 Implanted  ATTUNE PS FEM LT SZ 6 CEM KNEE - [id number] Femur ATTUNE PS FEM LT SZ 6 CEM KNEE  [employer] [id number] Left 1 Implanted    Indications for Surgery:   Ethan Oliver is a 67 y.o. male who failed to improve with nonsurgical management and presented for left total knee arthroplasty.  Benefits and risks of operative and nonoperative management were discussed prior to surgery with  patient/guardian(s) and informed consent form was completed.  Specific risks including infection, need for additional surgery, deep vein thrombosis, pulmonary embolus, loosening, postop pain   Procedure:   The patient was identified properly. Informed consent was obtained and the surgical site was marked. The patient was taken to the OR where general anesthesia was induced.  The patient was positioned supine on the operating table.  The left lower extremity was prepped and draped in the usual sterile fashion.  Timeout was performed before the beginning of the case.  Tourniquet was used for the above duration.  The limb was exsanguinated with a 4 inch Esmarch the tourniquet was elevated to 300 mmHg  A midline incision was made with the knee in flexion.  Subcutaneous tissue was divided extensor mechanism was identified.  A medial arthrotomy was performed.  The fat pad was excised.  The patella was everted.  Osteophytes removed from the anterior femur and anterior tibia.  The medial soft tissue sleeve was elevated to mid coronal plane.  The lateral proximal tibia was cleared of the fat pad to expose it for instrumentation.  A small window was placed for a curved Hohmann retractor laterally.  The medial meniscus was removed and the anterior portion of the lateral meniscus was removed the posterior portion was removed later  The notch was opened up with an osteotome and the PCL was intact and excised.  The knee was subluxated forward.  The 3/8 inch drill bit was entered into the femoral canal just off the midline approxi-1 cm anterior to  the notch  The femur was decompressed with suction and irrigation.  A 5 degree left 9 mm resection guide was placed and the block was pinned the bone was removed and measured to be 9 mm  We then turned our attention to the tibia.  We subluxated it forward placed a posterior Hohmann retractor and then completed meniscal excision medially and laterally  We set the  tibial guide based off the tibial shin prominence the medial third of the tibia and the proposed tibial spine which was also deficient.  There was some difficulty as the patient had had a previous foot fusion and his foot was completely externally rotated laterally  We set the guide for a 7 mm resection from the higher lateral side.  We measured our resection to be 9 mm  We then checked the extension gap and it was a 7 mm block to balance that he extension gap  We then measured the femur to be a 6.5 we took a 7 mm block and pinned in place we placed the 7 mm block for the femur and it did not fit.  I then went ahead and cut for the 7 and then did a trial spacer block test with a 6 mm block and a 7 mm block.  Submillimeter block did not fit but since it fit in extension I recut the femur down to a 6  We then got a good 7 mm spacer block in flexion and extension with the balance knee  We finished preparation on the femur and then the tibia  Final block check was done with a 7 spacer with good stability  We prepared the patella it was 25 mm in thickness we cut it down to a 13 and then put a 38 mm button on it we drilled the peg holes  We irrigated the wounds prepare the bone for cementation.  The cement was prepared on the back table and then brought over for cementation.  Implants were placed  Excess cement was removed  Flexion extension was checked using a medial lateral test in extension and then a push pull test and flexion.  The knee was stable with a 7 mm polyethylene and we then irrigated the wound first with saline and then with surgery for and then closed with #1 Braylon and then placed Zen relief in the joint  And then closed the subcu with interrupted and running 0 Monocryl suture  Sterile dressing was applied Cryo/Cuff was applied  Patient was extubated and taken to recovery room in stable condition   Post-operative plan:  Weightbearing as tolerated Aspirin for DVT  prophylaxis for 35 days Immediate range of motion Follow-up in 2 weeks

## 2022-04-23 NOTE — Anesthesia Procedure Notes (Signed)
Procedure Name: Intubation Date/Time: 04/23/2022 7:38 AM  Performed by: Vista Deck, CRNAPre-anesthesia Checklist: Patient identified, Patient being monitored, Timeout performed, Emergency Drugs available and Suction available Patient Re-evaluated:Patient Re-evaluated prior to induction Oxygen Delivery Method: Circle system utilized Preoxygenation: Pre-oxygenation with 100% oxygen Induction Type: IV induction Ventilation: Mask ventilation without difficulty Laryngoscope Size: Mac and 3 Grade View: Grade I Tube type: Oral Tube size: 7.5 mm Number of attempts: 1 Airway Equipment and Method: Stylet Placement Confirmation: ETT inserted through vocal cords under direct vision, positive ETCO2 and breath sounds checked- equal and bilateral Secured at: 22 cm Tube secured with: Tape Dental Injury: Teeth and Oropharynx as per pre-operative assessment

## 2022-04-23 NOTE — Progress Notes (Signed)
Patient alert and oriented. Wife at bedside. Patient has ice wrap to left leg. Dressing is dry and intact underneath his TED hose. Patient has SCD to right leg. Patient bone pillow off upon arrival to unit, patient unable to tolerate, however, primary nurse giving pain medications and to readdress. Patient and wife educated on incentive spirometry, patient states familiar as he has used previously. Patient tolerating liquids. No complaints of nausea.

## 2022-04-24 DIAGNOSIS — M1712 Unilateral primary osteoarthritis, left knee: Secondary | ICD-10-CM | POA: Diagnosis not present

## 2022-04-24 LAB — BASIC METABOLIC PANEL
Anion gap: 7 (ref 5–15)
BUN: 14 mg/dL (ref 8–23)
CO2: 27 mmol/L (ref 22–32)
Calcium: 8.2 mg/dL — ABNORMAL LOW (ref 8.9–10.3)
Chloride: 100 mmol/L (ref 98–111)
Creatinine, Ser: 0.9 mg/dL (ref 0.61–1.24)
GFR, Estimated: >60 mL/min (ref >60)
Glucose, Bld: 127 mg/dL — ABNORMAL HIGH (ref 70–99)
Potassium: 3.6 mmol/L (ref 3.5–5.1)
Sodium: 134 mmol/L — ABNORMAL LOW (ref 135–145)

## 2022-04-24 LAB — GLUCOSE, CAPILLARY
Glucose-Capillary: 174 mg/dL — ABNORMAL HIGH (ref 70–99)
Glucose-Capillary: 190 mg/dL — ABNORMAL HIGH (ref 70–99)

## 2022-04-24 LAB — CBC
HCT: 40 % (ref 39.0–52.0)
Hemoglobin: 13 g/dL (ref 13.0–17.0)
MCH: 30.4 pg (ref 26.0–34.0)
MCHC: 32.5 g/dL (ref 30.0–36.0)
MCV: 93.5 fL (ref 80.0–100.0)
Platelets: 151 10*3/uL (ref 150–400)
RBC: 4.28 MIL/uL (ref 4.22–5.81)
RDW: 13.8 % (ref 11.5–15.5)
WBC: 6 10*3/uL (ref 4.0–10.5)
nRBC: 0 % (ref 0.0–0.2)

## 2022-04-24 MED ORDER — POLYETHYLENE GLYCOL 3350 17 G PO PACK: 17.0000 g | PACK | Freq: Every day | ORAL | 0 refills | Status: DC | PRN

## 2022-04-24 MED ORDER — DOCUSATE SODIUM 100 MG PO CAPS: 100.0000 mg | ORAL_CAPSULE | Freq: Two times a day (BID) | ORAL | 0 refills | Status: DC

## 2022-04-24 MED ORDER — ASPIRIN 325 MG PO TBEC: 325.0000 mg | DELAYED_RELEASE_TABLET | Freq: Every day | ORAL | 0 refills | Status: DC

## 2022-04-24 MED ORDER — METHOCARBAMOL 500 MG PO TABS: 500.0000 mg | ORAL_TABLET | Freq: Four times a day (QID) | ORAL | 1 refills | Status: DC | PRN

## 2022-04-24 MED ORDER — OXYCODONE HCL 5 MG PO TABS: 5.0000 mg | ORAL_TABLET | ORAL | 0 refills | Status: AC | PRN

## 2022-04-24 NOTE — TOC Transition Note (Signed)
Transition of Care [facility]) - CM/SW Discharge Note   Patient Details  Name: Ethan Oliver MRN: [id number] Date of Birth: [birth year]  Transition of Care [facility]) CM/SW Contact:  Iona Beard, [place] Phone Number: 04/24/2022, 11:36 AM  Clinical Narrative:    [facility] consulted for HH/DME needs. CSW notes per PT evaluation that pt is not recommended for any DME. CSW spoke to [place] with [facility] who states [facility] services have been prearranged by Dr. Ruthe Mannan office. Marjory Lies states he has the needed orders and that they will start services once pt has been D/C.   Final next level of care: Home w Home Health Services Barriers to Discharge: Continued Medical Work up   Patient Goals and CMS Choice CMS Medicare.gov Compare Post Acute Care list provided to:: Patient Choice offered to / list presented to : Patient  Discharge Placement                         Discharge Plan and Services Additional resources added to the After Visit Summary for                            [facility] Arranged: PT [facility] Agency: [place] Date [place]: 04/24/22   Representative spoke with at [place]: [place] ([place]) Interventions SDOH Screenings   Tobacco Use: Medium Risk (04/23/2022)     Readmission Risk Interventions     No data to display

## 2022-04-24 NOTE — Progress Notes (Signed)
Patient ID: Ethan Oliver, male   DOB: April 17, 1955, 67 y.o.   MRN: [id number]   This is postop day 1 status post left total knee arthroplasty  The patient is doing well  BP 105/62 (BP Location: Left Arm)   Pulse 76   Temp 98.5 F (36.9 C) (Oral)   Resp 18   Ht 5' 11"$  (1.803 m)   Wt (!) 137.4 kg   SpO2 93%   BMI 42.25 kg/m   He was able to ambulate with physical therapy yesterday his range of motion was 0 to 100 degrees his postop x-ray looked good     Latest Ref Rng & Units 04/24/2022    4:08 AM 04/19/2022    9:06 AM 02/13/2018   12:46 PM  CBC  WBC 4.0 - 10.5 K/uL 6.0  5.5  5.5   Hemoglobin 13.0 - 17.0 g/dL 13.0  15.3  15.1   Hematocrit 39.0 - 52.0 % 40.0  47.1  46.8   Platelets 150 - 400 K/uL 151  182  180        Latest Ref Rng & Units 04/24/2022    4:08 AM 04/19/2022    9:06 AM 04/26/2021    2:43 PM  BMP  Glucose 70 - 99 mg/dL 127  134  141   BUN 8 - 23 mg/dL 14  25  19   $ Creatinine 0.61 - 1.24 mg/dL 0.90  0.88  0.83   Sodium 135 - 145 mmol/L 134  135  134   Potassium 3.5 - 5.1 mmol/L 3.6  3.9  4.0   Chloride 98 - 111 mmol/L 100  102  99   CO2 22 - 32 mmol/L 27  23  24   $ Calcium 8.9 - 10.3 mg/dL 8.2  9.2  9.4     Neurovascular exam is intact   Plan for discharge today as long as he is able to complete his physical therapy and steps

## 2022-04-24 NOTE — Discharge Summary (Signed)
Physician Discharge Summary  Patient ID: Ethan Oliver MRN: [id number] DOB/AGE: Sep 07, 1955 67 y.o.  Admit date: 04/23/2022 Discharge date: 04/24/2022  Admission Diagnoses: Osteoarthritis left knee  Discharge Diagnoses:  Principal Problem:   Primary osteoarthritis of left knee   Discharged Condition: good  Hospital Course: Hospital day 1 the patient had uncomplicated total knee on the left with a DePuy fixed-bearing posterior stabilized knee using the attune system  He was able to participate in physical therapy on day 1 walked several feet and had 90+ degrees of flexion of his knee  Hospital day 2 he continued to progress well with therapy clearing walking up and down the stairs and continuing with excellent knee flexion and he was discharged home in stable condition no signs of fever infection or DVT  Neurovascular exam was intact     Latest Ref Rng & Units 04/24/2022    4:08 AM 04/19/2022    9:06 AM 04/26/2021    2:43 PM  BMP  Glucose 70 - 99 mg/dL 127  134  141   BUN 8 - 23 mg/dL 14  25  19   $ Creatinine 0.61 - 1.24 mg/dL 0.90  0.88  0.83   Sodium 135 - 145 mmol/L 134  135  134   Potassium 3.5 - 5.1 mmol/L 3.6  3.9  4.0   Chloride 98 - 111 mmol/L 100  102  99   CO2 22 - 32 mmol/L 27  23  24   $ Calcium 8.9 - 10.3 mg/dL 8.2  9.2  9.4        Latest Ref Rng & Units 04/24/2022    4:08 AM 04/19/2022    9:06 AM 02/13/2018   12:46 PM  CBC  WBC 4.0 - 10.5 K/uL 6.0  5.5  5.5   Hemoglobin 13.0 - 17.0 g/dL 13.0  15.3  15.1   Hematocrit 39.0 - 52.0 % 40.0  47.1  46.8   Platelets 150 - 400 K/uL 151  182  180        Discharge Exam: Blood pressure 120/73, pulse 76, temperature 98.5 F (36.9 C), temperature source Oral, resp. rate 18, height 5' 11"$  (1.803 m), weight (!) 137.4 kg, SpO2 93 %.  Disposition: Discharge disposition: 01-Home or Self Care       Discharge Instructions     Call MD / Call 911   Complete by: As directed    If you experience chest pain or shortness of  breath, CALL 911 and be transported to the hospital emergency room.  If you develope a fever above 101 F, pus (white drainage) or increased drainage or redness at the wound, or calf pain, call your surgeon's office.   Constipation Prevention   Complete by: As directed    Drink plenty of fluids.  Prune juice may be helpful.  You may use a stool softener, such as Colace (over the counter) 100 mg twice a day.  Use MiraLax (over the counter) for constipation as needed.   Diet - low sodium heart healthy   Complete by: As directed    Discharge instructions   Complete by: As directed    You have a blue foam pillow, use it 30 minutes 3 times a day  If you have a continuous passive motion machine or CPM use is 6 hours a day and any combination of time that you want.  Start at 0-80 increase 10 degrees/day until you reach 120 degrees  Do not take a shower until you see the  doctor  You do not have to change the dressing  Wear the stockings for 4 weeks   Increase activity slowly as tolerated   Complete by: As directed    Post-operative opioid taper instructions:   Complete by: As directed    POST-OPERATIVE OPIOID TAPER INSTRUCTIONS: It is important to wean off of your opioid medication as soon as possible. If you do not need pain medication after your surgery it is ok to stop day one. Opioids include: Codeine, Hydrocodone(Norco, Vicodin), Oxycodone(Percocet, oxycontin) and hydromorphone amongst others.  Long term and even short term use of opiods can cause: Increased pain response Dependence Constipation Depression Respiratory depression And more.  Withdrawal symptoms can include Flu like symptoms Nausea, vomiting And more Techniques to manage these symptoms Hydrate well Eat regular healthy meals Stay active Use relaxation techniques(deep breathing, meditating, yoga) Do Not substitute Alcohol to help with tapering If you have been on opioids for less than two weeks and do not have pain  than it is ok to stop all together.  Plan to wean off of opioids This plan should start within one week post op of your joint replacement. Maintain the same interval or time between taking each dose and first decrease the dose.  Cut the total daily intake of opioids by one tablet each day Next start to increase the time between doses. The last dose that should be eliminated is the evening dose.         Allergies as of 04/24/2022       Reactions   Codeine Nausea Only        Medication List     STOP taking these medications    celecoxib 100 MG capsule Commonly known as: CELEBREX       TAKE these medications    aspirin EC 325 MG tablet Take 1 tablet (325 mg total) by mouth daily with breakfast.   B-12 PO Take 1 tablet by mouth daily.   citalopram 20 MG tablet Commonly known as: CELEXA Take 20 mg by mouth daily.   docusate sodium 100 MG capsule Commonly known as: COLACE Take 1 capsule (100 mg total) by mouth 2 (two) times daily.   glipiZIDE 10 MG 24 hr tablet Commonly known as: GLUCOTROL XL Take 10 mg by mouth every evening.   hydrochlorothiazide 12.5 MG tablet Commonly known as: HYDRODIURIL Take 12.5 mg by mouth daily.   lisinopril 20 MG tablet Commonly known as: ZESTRIL Take 20 mg by mouth daily.   metFORMIN 500 MG 24 hr tablet Commonly known as: GLUCOPHAGE-XR Take 500 mg by mouth daily with supper.   methocarbamol 500 MG tablet Commonly known as: ROBAXIN Take 1 tablet (500 mg total) by mouth every 6 (six) hours as needed for muscle spasms.   oxyCODONE 5 MG immediate release tablet Commonly known as: Oxy IR/ROXICODONE Take 1 tablet (5 mg total) by mouth every 4 (four) hours as needed for up to 5 days for severe pain. What changed:  medication strength when to take this reasons to take this   polyethylene glycol 17 g packet Commonly known as: MIRALAX / GLYCOLAX Take 17 g by mouth daily as needed for mild constipation.   pravastatin 40 MG  tablet Commonly known as: PRAVACHOL Take 40 mg by mouth daily.        Follow-up Information     Carole Civil, MD Follow up on 05/08/2022.   Specialties: Orthopedic Surgery, Radiology Contact information: [address] [place] [address] [phone]  Signed: Arther Abbott 04/24/2022, 12:16 PM

## 2022-04-24 NOTE — Progress Notes (Signed)
Physical Therapy Treatment Patient Details Name: Ethan Oliver MRN: [id number] DOB: 1955-11-12 Today's Date: 04/24/2022   LEFT KNEE ROM: 0 - 90 degrees AMBULATION DISTANCE: 100 feet using RW with Modified [place]   History of Present Illness DENHAM WILDY is a 67 y/o male, s/p Left TKA on 04/23/22 with the diagnosis of left knee osteoarthritis.    PT Comments    Patient demonstrates good return for going up/down steps using both hands on 1 side rail without loss of balance and understanding acknowledged by patient and spouse, increased endurance/distance for gait training without loss of balance and limited for left knee flexion due to increased pain/soreness at end range.  Patient tolerated sitting up in chair with LLE dangling after therapy.  Patient will benefit from continued skilled physical therapy in hospital and recommended venue below to increase strength, balance, endurance for safe ADLs and gait.    Recommendations for follow up therapy are one component of a multi-disciplinary discharge planning process, led by the attending physician.  Recommendations may be updated based on patient status, additional functional criteria and insurance authorization.  Follow Up Recommendations  Home health PT     Assistance Recommended at Discharge Set up Supervision/Assistance  Patient can return home with the following A little help with walking and/or transfers;A little help with bathing/dressing/bathroom;Help with stairs or ramp for entrance;Assistance with cooking/housework   Equipment Recommendations  None recommended by PT    Recommendations for Other Services       Precautions / Restrictions Precautions Precautions: Fall Restrictions Weight Bearing Restrictions: Yes LLE Weight Bearing: Weight bearing as tolerated     Mobility  Bed Mobility               General bed mobility comments: Presents seated at EOB    Transfers Overall transfer level: Needs  assistance Equipment used: Rolling walker (2 wheels) Transfers: Sit to/from Stand, Bed to chair/wheelchair/BSC Sit to Stand: Supervision, Modified independent (Device/Increase time)   Step pivot transfers: Modified independent (Device/Increase time), Supervision       General transfer comment: good return for transferring to/from bed/chair with labored movement, increased pain left knee    Ambulation/Gait Ambulation/Gait assistance: Modified independent (Device/Increase time) Gait Distance (Feet): 100 Feet Assistive device: Rolling walker (2 wheels) Gait Pattern/deviations: Decreased step length - right, Decreased step length - left, Decreased stride length, Decreased stance time - left, Antalgic Gait velocity: decreased     General Gait Details: increased endurance/distance for gait training with fair/good return for left heel to toe stepping without loss of balance, baseline excessive bilateral ankle external rotation   Stairs Stairs: Yes Stairs assistance: Supervision Stair Management: One rail Left, Step to pattern, Sideways Number of Stairs: 4 General stair comments: demonstrates good return for going up/down steps using both hands on 1 side rail without loss of balance and understanding acknowledged patient and his spouse   Wheelchair Mobility    Modified Rankin (Stroke Patients Only)       Balance Overall balance assessment: Needs assistance Sitting-balance support: Feet supported, No upper extremity supported Sitting balance-Leahy Scale: Good Sitting balance - Comments: seated at EOB   Standing balance support: During functional activity, Bilateral upper extremity supported Standing balance-Leahy Scale: Fair Standing balance comment: fair/good using RW                            Cognition Arousal/Alertness: Awake/alert Behavior During Therapy: WFL for tasks assessed/performed Overall Cognitive Status: Within  Functional Limits for tasks assessed                                           Exercises Total Joint Exercises Goniometric ROM: left knee ROM: 0 - 90 degrees    General Comments        Pertinent Vitals/Pain Pain Assessment Pain Assessment: 0-10 Pain Score: 8  Pain Location: left knee Pain Descriptors / Indicators: Sore, Grimacing, Discomfort Pain Intervention(s): Limited activity within patient's tolerance, Monitored during session, Premedicated before session, Patient requesting pain meds-RN notified    Home Living                          Prior Function            PT Goals (current goals can now be found in the care plan section) Acute Rehab PT Goals Patient Stated Goal: return home with family to assist PT Goal Formulation: With patient/family Time For Goal Achievement: 04/25/22 Potential to Achieve Goals: Good Progress towards PT goals: Progressing toward goals    Frequency    BID      PT Plan Current plan remains appropriate    Co-evaluation              AM-PAC PT "6 Clicks" Mobility   Outcome Measure  Help needed turning from your back to your side while in a flat bed without using bedrails?: None Help needed moving from lying on your back to sitting on the side of a flat bed without using bedrails?: A Little Help needed moving to and from a bed to a chair (including a wheelchair)?: A Little Help needed standing up from a chair using your arms (e.g., wheelchair or bedside chair)?: A Little Help needed to walk in hospital room?: A Little Help needed climbing 3-5 steps with a railing? : A Little 6 Click Score: 19    End of Session Equipment Utilized During Treatment: Gait belt Activity Tolerance: Patient tolerated treatment well;Patient limited by fatigue;Patient limited by pain Patient left: in chair;with call bell/phone within reach Nurse Communication: Mobility status PT Visit Diagnosis: Unsteadiness on feet (R26.81);Other abnormalities of gait and  mobility (R26.89);Muscle weakness (generalized) (M62.81)     Time: [id number] PT Time Calculation (min) (ACUTE ONLY): 23 min  Charges:  $Gait Training: 8-22 mins $Therapeutic Activity: 8-22 mins                     9:51 AM, 04/24/22 Lonell Grandchild, MPT Physical Therapist with [facility] [phone] office [phone] mobile phone

## 2022-04-26 ENCOUNTER — Telehealth: Payer: Self-pay | Admitting: Orthopedic Surgery

## 2022-04-26 NOTE — Telephone Encounter (Signed)
I spoke to her gave instructions about ice She said they do not have a 2 week appointment would like to make that Can you call with that 14-16 day postop please

## 2022-04-26 NOTE — Anesthesia Postprocedure Evaluation (Signed)
Anesthesia Post Note  Patient: Ethan Oliver  Procedure(s) Performed: TOTAL KNEE ARTHROPLASTY (Left: Knee)  Patient location during evaluation: Phase II Anesthesia Type: General Level of consciousness: awake Pain management: pain level controlled Vital Signs Assessment: post-procedure vital signs reviewed and stable Respiratory status: spontaneous breathing and respiratory function stable Cardiovascular status: blood pressure returned to baseline and stable Postop Assessment: no headache and no apparent nausea or vomiting Anesthetic complications: no Comments: Late entry   No notable events documented.   Last Vitals:  Vitals:   04/24/22 0403 04/24/22 0838  BP: 105/62 120/73  Pulse: 76   Resp: 18   Temp: 36.9 C   SpO2: 93%     Last Pain:  Vitals:   04/24/22 1052  TempSrc:   PainSc: [place]

## 2022-04-26 NOTE — Telephone Encounter (Signed)
Quita Skye, the patient's spouse lvm stating that the patient just had surgery and they have some questions.  [phone]

## 2022-04-29 ENCOUNTER — Telehealth: Payer: Self-pay | Admitting: Orthopedic Surgery

## 2022-04-29 ENCOUNTER — Encounter (HOSPITAL_COMMUNITY): Payer: Self-pay | Admitting: Orthopedic Surgery

## 2022-04-29 DIAGNOSIS — M1712 Unilateral primary osteoarthritis, left knee: Secondary | ICD-10-CM

## 2022-04-29 MED ORDER — COMMODE 3-IN-1 MISC: 0 refills | Status: DC

## 2022-04-29 NOTE — Telephone Encounter (Signed)
Sent in to [place] for him.

## 2022-04-29 NOTE — Telephone Encounter (Signed)
I called her to let her know we sent in for him to [facility] I spoke to him and he will tell her was sent to [facility]

## 2022-04-29 NOTE — Telephone Encounter (Signed)
Patient's spouse Quita Skye [phone] lvm stating that they thought they had a commode stool for him, but they do not.  They want a script for one sent in and she stated that he needs an oversized one, regular size is too small for him.

## 2022-04-30 ENCOUNTER — Telehealth: Payer: Self-pay | Admitting: Orthopedic Surgery

## 2022-04-30 DIAGNOSIS — M1712 Unilateral primary osteoarthritis, left knee: Secondary | ICD-10-CM

## 2022-04-30 MED ORDER — COMMODE 3-IN-1 MISC: 0 refills | Status: DC

## 2022-04-30 NOTE — Telephone Encounter (Signed)
I called her she had me send in the 3 in 1 to [place] instead of [place] and I have sent in, also sent in the demographics by fax.

## 2022-04-30 NOTE — Telephone Encounter (Signed)
Quita Skye, the pt's spouse called, would like Amy to call her in reference to the toilet seat they trying to get him [phone]

## 2022-05-06 ENCOUNTER — Encounter: Payer: Self-pay | Admitting: Orthopedic Surgery

## 2022-05-06 ENCOUNTER — Ambulatory Visit (INDEPENDENT_AMBULATORY_CARE_PROVIDER_SITE_OTHER): Payer: PPO | Admitting: Orthopedic Surgery

## 2022-05-06 DIAGNOSIS — Z96652 Presence of left artificial knee joint: Secondary | ICD-10-CM

## 2022-05-06 MED ORDER — APIXABAN 5 MG PO TABS: 5.0000 mg | ORAL_TABLET | Freq: Two times a day (BID) | ORAL | 0 refills | Status: DC

## 2022-05-06 MED ORDER — OXYCODONE HCL 5 MG PO CAPS: 5.0000 mg | ORAL_CAPSULE | ORAL | 0 refills | Status: DC | PRN

## 2022-05-06 NOTE — Progress Notes (Signed)
POST OP APPT   Encounter Diagnosis  Name Primary?   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    Dispense:  46 tablet    Refill:  0   oxycodone (OXY-IR) 5 MG capsule    Sig: Take 1 capsule (5 mg total) by mouth every 4 (four) hours as needed for up to 5 days.    Dispense:  30 capsule    Refill:  0     mplants: Implant Name Type Inv. Item Serial No. Manufacturer Lot No. LRB No. Used Action  CEMENT HV SMART SET - [id number] Cement CEMENT HV SMART SET   [employer] [id number] Left 1 Implanted  CEMENT HV SMART SET - [id number] Cement CEMENT HV SMART SET   [employer] [id number] Left 1 Implanted  ATTUNE MED DOME PAT 38 KNEE - [id number] Knees ATTUNE MED DOME PAT 38 KNEE   [employer] [id number] Left 1 Implanted  INSERT TIB ATTUNE FB [id number] - [id number] Insert INSERT TIB ATTUNE FB [id number]   [employer] [id number] Left 1 Implanted  BASE TIBIAL CEM ATTUNE SZ 7 - [id number] Knees BASE TIBIAL CEM ATTUNE SZ 7   [employer] [id number] Left 1 Implanted  ATTUNE PS FEM LT SZ 6 CEM KNEE - [id number] Femur ATTUNE PS FEM LT SZ 6 CEM KNEE   [employer] [id number] Left 1 Implanted

## 2022-05-07 ENCOUNTER — Ambulatory Visit (HOSPITAL_COMMUNITY): Payer: PPO | Attending: Orthopedic Surgery | Admitting: Physical Therapy

## 2022-05-07 ENCOUNTER — Other Ambulatory Visit: Payer: Self-pay | Admitting: Radiology

## 2022-05-07 ENCOUNTER — Other Ambulatory Visit: Payer: Self-pay | Admitting: Orthopedic Surgery

## 2022-05-07 DIAGNOSIS — M1712 Unilateral primary osteoarthritis, left knee: Secondary | ICD-10-CM | POA: Diagnosis not present

## 2022-05-07 DIAGNOSIS — R2689 Other abnormalities of gait and mobility: Secondary | ICD-10-CM | POA: Diagnosis present

## 2022-05-07 DIAGNOSIS — M25662 Stiffness of left knee, not elsewhere classified: Secondary | ICD-10-CM | POA: Diagnosis present

## 2022-05-07 DIAGNOSIS — Z96652 Presence of left artificial knee joint: Secondary | ICD-10-CM

## 2022-05-07 DIAGNOSIS — M25562 Pain in left knee: Secondary | ICD-10-CM | POA: Diagnosis present

## 2022-05-07 MED ORDER — OXYCODONE HCL 5 MG PO TABS: 5.0000 mg | ORAL_TABLET | ORAL | 0 refills | Status: AC | PRN

## 2022-05-07 NOTE — Progress Notes (Signed)
Meds ordered this encounter  Medications   oxyCODONE (OXY IR/ROXICODONE) 5 MG immediate release tablet    Sig: Take 1 tablet (5 mg total) by mouth every 4 (four) hours as needed for up to 5 days for severe pain.    Dispense:  30 tablet    Refill:  0

## 2022-05-07 NOTE — Telephone Encounter (Signed)
Oxy ir tablets needed instead of capsules, not covered by insurance

## 2022-05-07 NOTE — Therapy (Signed)
OUTPATIENT PHYSICAL THERAPY LOWER EXTREMITY EVALUATION   Patient Name: Ethan Oliver MRN: [id number] DOB:November 20, 1955, 67 y.o., male Today's Date: 05/07/2022  END OF SESSION:  PT End of Session - 05/07/22 0844     Visit Number 1    Number of Visits 18    Date for PT Re-Evaluation 06/18/22    Authorization Type [employer]    Progress Note Due on Visit 10    PT Start Time 0816    PT Stop Time 0855    PT Time Calculation (min) 39 min    Activity Tolerance Patient tolerated treatment well;Patient limited by fatigue    Behavior During Therapy [facility] for tasks assessed/performed             Past Medical History:  Diagnosis Date   Arthritis    Chronic back pain    Depression    Diabetes mellitus without complication ([place])    History of gout    History of kidney stones    Hypercholesteremia    Hypertension    Past Surgical History:  Procedure Laterality Date   ACHILLES TENDON SURGERY Left 12/29/2013   Procedure: PERCUTANEOUS TENDO-ACHILLES LENGTHENING;  Surgeon: Marcheta Grammes, DPM;  Location: [facility];  Service: Podiatry;  Laterality: Left;   CALCANEAL OSTEOTOMY WITH ILIAC CREST BONE GRAFT AND REPAIR [place] TENDON AND ACHILLES TENDON Left 12/29/2013   Procedure: CALCANEOCUBOID JOINT FUSION PERFORMED WITH BONE GRAFT;  Surgeon: Marcheta Grammes, DPM;  Location: [facility];  Service: Podiatry;  Laterality: Left;   CARPAL TUNNEL RELEASE Bilateral    COLONOSCOPY WITH PROPOFOL N/A 05/02/2021   Procedure: COLONOSCOPY WITH PROPOFOL;  Surgeon: Rogene Houston, MD;  Location[facility];  Service: Endoscopy;  Laterality: N/A;  930   Disk fusion     lumbar   FLEXOR TENOTOMY  Right 02/18/2018   Procedure: PERCUTANEOUS FLEXOR TENOTOMY/APPLICATION OF BK POSTERIOR SPLINT RLE;  Surgeon: Caprice Beaver, DPM;  Location: [facility];  Service: Podiatry;  Laterality: Right;   FOOT ARTHRODESIS Left 12/29/2013   Procedure: TRIPLE ARTHRODESIS FOOT;  Surgeon: Marcheta Grammes, DPM;  Location: [facility];  Service: Podiatry;  Laterality: Left;   FOOT ARTHRODESIS Right 02/18/2018   Procedure: TRIPLE ARTHRODESIS RIGHT FOOT;  Surgeon: Caprice Beaver, DPM;  Location: [facility];  Service: Podiatry;  Laterality: Right;   KNEE SURGERY Right    Surgery on this knee twice.    POLYPECTOMY  05/02/2021   Procedure: POLYPECTOMY INTESTINAL;  Surgeon: Rogene Houston, MD;  Location: [facility];  Service: Endoscopy;;   TOTAL KNEE ARTHROPLASTY Left 04/23/2022   Procedure: TOTAL KNEE ARTHROPLASTY;  Surgeon: Carole Civil, MD;  Location: [facility];  Service: Orthopedics;  Laterality: Left;   Patient Active Problem List   Diagnosis Date Noted   Primary osteoarthritis of left knee 04/23/2022   Mood disorder ([place]) 09/12/2021   Postlaminectomy syndrome of lumbar region 09/12/2021   Arthritis of left elbow 06/21/2019   Cubital tunnel syndrome on left 06/21/2019   Inflammation of left elbow 06/21/2019   Arthritis, lumbar spine 08/05/2018   Benign essential hypertension 11/07/2016   Generalized osteoarthritis of multiple sites 11/07/2016   Type 2 diabetes mellitus without complication, without long-term current use of insulin ([place]) 11/07/2016   Diastasis recti 11/07/2016   Pure hypercholesterolemia 11/07/2016   Morbid obesity with BMI of 40.0-44.9, adult ([place]) 12/27/2013   Back pain 12/27/2013   Degeneration of lumbar intervertebral disc 12/27/2013   Lumbar radiculopathy 12/27/2013   Lumbosacral spondylosis without  myelopathy 12/28/2012    PCP: Luciano Cutter DO  REFERRING PROVIDER: Carole Civil, MD  REFERRING DIAG: [phone] (ICD-10-CM) - Unilateral primary osteoarthritis, left knee  THERAPY DIAG:  Left knee pain, unspecified chronicity  Stiffness of left knee, not elsewhere classified  Other abnormalities of gait and mobility  Rationale for Evaluation and Treatment: Rehabilitation  ONSET DATE: 04/23/22  SUBJECTIVE:   SUBJECTIVE STATEMENT: Patient  presents to therapy s/p LT TKA on 04/23/22. Pain is moderate. He is moving slowly. He is walking with RW. He has been having difficulty sleeping at night because it is hard to get comfortable. He is taking pain meds PRN. He is using CPM as instructed. He had home health therapy for 2 weeks.   PERTINENT HISTORY: LT TKA 04/23/22, lumbar surgery, bilat foot surgery   PAIN:  Are you having pain? Yes: NPRS scale: 6/10 Pain location: LT knee Pain description: aching, sore  Aggravating factors: WB, walking  Relieving factors: pain meds, rest, icing   PRECAUTIONS: None  WEIGHT BEARING RESTRICTIONS: No  FALLS:  Has patient fallen in last 6 months? No  LIVING ENVIRONMENT: Lives with: lives with their spouse Lives in: House/apartment Stairs: Yes: External: 1 steps; none Has following equipment at home: Single point cane and Walker - 2 wheeled  OCCUPATION: Retired   PLOF: Independent with basic ADLs  PATIENT GOALS: Get better and get out of it (therapy)  NEXT MD VISIT: 05/07/22  OBJECTIVE:   DIAGNOSTIC FINDINGS: NA  PATIENT SURVEYS:  FOTO 52% function   COGNITION: Overall cognitive status: Within functional limits for tasks assessed     SENSATION: WFL  EDEMA:  Min/ mod   LOWER EXTREMITY ROM:  Active ROM Right eval Left eval  Hip flexion    Hip extension    Hip abduction    Hip adduction    Hip internal rotation    Hip external rotation    Knee flexion 118 72  Knee extension 0 -4  Ankle dorsiflexion    Ankle plantarflexion    Ankle inversion    Ankle eversion     (Blank rows = not tested)  LOWER EXTREMITY MMT:  MMT Right eval Left eval  Hip flexion    Hip extension    Hip abduction    Hip adduction    Hip internal rotation    Hip external rotation    Knee flexion  4  Knee extension  4  Ankle dorsiflexion  5  Ankle plantarflexion    Ankle inversion    Ankle eversion     (Blank rows = not tested)   FUNCTIONAL TESTS:  2 minute walk test: 200 feet  with RW   GAIT:  Decreased stride, decreased LT knee and hip flexion, slightly antalgic, out-toeing bilateral   TODAY'S TREATMENT:  DATE:  05/07/22 Eval    PATIENT EDUCATION:  Education details: on eval findings, POC and HEP  Person educated: Patient Education method: Explanation Education comprehension: verbalized understanding  HOME EXERCISE PROGRAM: Access Code: [email or web address] URL: [email or web address] Date: 05/07/2022 Prepared by: Josue Hector  Exercises - Supine Quad Set  - 3 x daily - 7 x weekly - 2 sets - 10 reps - 5 sec hold - Active Straight Leg Raise with Quad Set  - 3 x daily - 7 x weekly - 2 sets - 10 reps - Supine Heel Slide  - 3 x daily - 7 x weekly - 2 sets - 10 reps - 5 sec hold - Supine Ankle Pumps  - 3 x daily - 7 x weekly - 2 sets - 10 reps - Hooklying Gluteal Sets  - 3 x daily - 7 x weekly - 2 sets - 10 reps - 5 sec hold - Supine Knee Extension Mobilization with Weight  - 3 x daily - 7 x weekly - 1 sets - 1 reps - 3-5 minute hold  ASSESSMENT:  CLINICAL IMPRESSION: Patient is a 66 y.o. male who presents to physical therapy with complaint of LT knee pain. Patient demonstrates muscle weakness, reduced ROM, and fascial restrictions which are likely contributing to symptoms of pain and are negatively impacting patient ability to perform ADLs and functional mobility tasks. Patient will benefit from skilled physical therapy services to address these deficits to reduce pain and improve level of function with ADLs and functional mobility tasks.   OBJECTIVE IMPAIRMENTS: Abnormal gait, decreased activity tolerance, decreased balance, decreased endurance, decreased mobility, difficulty walking, decreased ROM, decreased strength, hypomobility, increased edema, impaired flexibility, improper body mechanics, and pain.   ACTIVITY LIMITATIONS:  carrying, lifting, bending, sitting, standing, squatting, sleeping, stairs, transfers, bed mobility, bathing, and locomotion level  PARTICIPATION LIMITATIONS: meal prep, cleaning, laundry, driving, shopping, community activity, and yard work  PERSONAL FACTORS:  None  are also affecting patient's functional outcome.   REHAB POTENTIAL: Good  CLINICAL DECISION MAKING: Stable/uncomplicated  EVALUATION COMPLEXITY: Low   GOALS: SHORT TERM GOALS: Target date: 05/28/2022  Patient will be independent with initial HEP and self-management strategies to improve functional outcomes Baseline:  Goal status: INITIAL   LONG TERM GOALS: Target date: 06/18/2022  Patient will be independent with advanced HEP and self-management strategies to improve functional outcomes Baseline:  Goal status: INITIAL  2.  Patient will improve FOTO score to predicted value to indicate improvement in functional outcomes Baseline: 52% Goal status: INITIAL  3.  Patient will have LT knee AROM 0-120 degrees to improve functional mobility and facilitate squatting to pick up items from floor. Baseline: -4 to 72 Goal status: INITIAL  4. Patient will have equal to or > 4+/5 MMT throughout LLE to improve ability to perform functional mobility, stair ambulation and ADLs.  Baseline: See MMT Goal status: INITIAL  5. Patient will be able to ambulate at least 325 feet during 2MWT with LRAD to demonstrate improved ability to perform functional mobility and associated tasks. Baseline: 200 feet with RW  Goal status: INITIAL  PLAN:  PT FREQUENCY: 3x/week  PT DURATION: 6 weeks  PLANNED INTERVENTIONS: Therapeutic exercises, Therapeutic activity, Neuromuscular re-education, Balance training, Gait training, Patient/Family education, Joint manipulation, Joint mobilization, Stair training, Aquatic Therapy, Dry Needling, Electrical stimulation, Spinal manipulation, Spinal mobilization, Cryotherapy, Moist heat, scar mobilization,  Taping, Traction, Ultrasound, Biofeedback, Ionotophoresis '4mg'$ /ml Dexamethasone, and Manual therapy.   PLAN FOR NEXT SESSION: Progress knee ROM and quad/ glute strength  as tolerated.    8:54 AM, 05/07/22 Josue Hector PT DPT  Physical Therapist with [facility]  [phone]

## 2022-05-08 ENCOUNTER — Encounter: Payer: Self-pay | Admitting: Orthopedic Surgery

## 2022-05-08 ENCOUNTER — Ambulatory Visit (INDEPENDENT_AMBULATORY_CARE_PROVIDER_SITE_OTHER): Payer: PPO | Admitting: Orthopedic Surgery

## 2022-05-08 DIAGNOSIS — Z96652 Presence of left artificial knee joint: Secondary | ICD-10-CM

## 2022-05-08 NOTE — Progress Notes (Signed)
POST OP VISIT   Encounter Diagnosis  Name Primary?   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

## 2022-05-09 ENCOUNTER — Encounter (HOSPITAL_COMMUNITY): Payer: Self-pay | Admitting: Physical Therapy

## 2022-05-09 ENCOUNTER — Encounter: Payer: Self-pay | Admitting: Radiology

## 2022-05-09 ENCOUNTER — Ambulatory Visit (HOSPITAL_COMMUNITY): Payer: PPO | Admitting: Physical Therapy

## 2022-05-09 DIAGNOSIS — M25662 Stiffness of left knee, not elsewhere classified: Secondary | ICD-10-CM

## 2022-05-09 DIAGNOSIS — R2689 Other abnormalities of gait and mobility: Secondary | ICD-10-CM

## 2022-05-09 DIAGNOSIS — M25562 Pain in left knee: Secondary | ICD-10-CM

## 2022-05-09 NOTE — Therapy (Signed)
OUTPATIENT PHYSICAL THERAPY LOWER EXTREMITY EVALUATION   Patient Name: Ethan Oliver MRN: [id number] DOB:[birth year], 67 y.o., male Today's Date: 05/09/2022  END OF SESSION:  PT End of Session - 05/09/22 1118     Visit Number 2    Number of Visits 18    Date for PT Re-Evaluation 06/18/22    Authorization Type [employer]    Progress Note Due on Visit 10    PT Start Time 1115    PT Stop Time 1155    PT Time Calculation (min) 40 min    Activity Tolerance Patient tolerated treatment well    Behavior During Therapy WFL for tasks assessed/performed             Past Medical History:  Diagnosis Date   Arthritis    Chronic back pain    Depression    Diabetes mellitus without complication ([place])    History of gout    History of kidney stones    Hypercholesteremia    Hypertension    Past Surgical History:  Procedure Laterality Date   ACHILLES TENDON SURGERY Left 12/29/2013   Procedure: PERCUTANEOUS TENDO-ACHILLES LENGTHENING;  Surgeon: Marcheta Grammes, DPM;  Location: [facility];  Service: Podiatry;  Laterality: Left;   CALCANEAL OSTEOTOMY WITH ILIAC CREST BONE GRAFT AND REPAIR [place] TENDON AND ACHILLES TENDON Left 12/29/2013   Procedure: CALCANEOCUBOID JOINT FUSION PERFORMED WITH BONE GRAFT;  Surgeon: Marcheta Grammes, DPM;  Location: [facility];  Service: Podiatry;  Laterality: Left;   CARPAL TUNNEL RELEASE Bilateral    COLONOSCOPY WITH PROPOFOL N/A 05/02/2021   Procedure: COLONOSCOPY WITH PROPOFOL;  Surgeon: Rogene Houston, MD;  [facility]: [facility];  Service: Endoscopy;  Laterality: N/A;  930   Disk fusion     lumbar   FLEXOR TENOTOMY  Right 02/18/2018   Procedure: PERCUTANEOUS FLEXOR TENOTOMY/APPLICATION OF BK POSTERIOR SPLINT RLE;  Surgeon: Caprice Beaver, DPM;  Location: [facility];  Service: Podiatry;  Laterality: Right;   FOOT ARTHRODESIS Left 12/29/2013   Procedure: TRIPLE ARTHRODESIS FOOT;  Surgeon: Marcheta Grammes, DPM;  Location: [facility];  Service: Podiatry;  Laterality: Left;   FOOT ARTHRODESIS Right 02/18/2018   Procedure: TRIPLE ARTHRODESIS RIGHT FOOT;  Surgeon: Caprice Beaver, DPM;  Location: [facility];  Service: Podiatry;  Laterality: Right;   KNEE SURGERY Right    Surgery on this knee twice.    POLYPECTOMY  05/02/2021   Procedure: POLYPECTOMY INTESTINAL;  Surgeon: Rogene Houston, MD;  [facility]: [facility];  Service: Endoscopy;;   TOTAL KNEE ARTHROPLASTY Left 04/23/2022   Procedure: TOTAL KNEE ARTHROPLASTY;  Surgeon: Carole Civil, MD;  Location: [facility];  Service: Orthopedics;  Laterality: Left;   Patient Active Problem List   Diagnosis Date Noted   S/P TKR (total knee replacement), left 04/23/22 05/07/2022   Primary osteoarthritis of left knee 04/23/2022   Mood disorder ([place]) 09/12/2021   Postlaminectomy syndrome of lumbar region 09/12/2021   Arthritis of left elbow 06/21/2019   Cubital tunnel syndrome on left 06/21/2019   Inflammation of left elbow 06/21/2019   Arthritis, lumbar spine 08/05/2018   Benign essential hypertension 11/07/2016   Generalized osteoarthritis of multiple sites 11/07/2016   Type 2 diabetes mellitus without complication, without long-term current use of insulin ([place]) 11/07/2016   Diastasis recti 11/07/2016   Pure hypercholesterolemia 11/07/2016   Morbid obesity with BMI of 40.0-44.9, adult ([place]) 12/27/2013   Back pain 12/27/2013   Degeneration of lumbar intervertebral disc 12/27/2013   Lumbar  radiculopathy 12/27/2013   Lumbosacral spondylosis without myelopathy 12/28/2012    PCP: Luciano Cutter DO  REFERRING PROVIDER: Carole Civil, MD  REFERRING DIAG: [phone] (ICD-10-CM) - Unilateral primary osteoarthritis, left knee  THERAPY DIAG:  Left knee pain, unspecified chronicity  Stiffness of left knee, not elsewhere classified  Other abnormalities of gait and mobility  Rationale for Evaluation and Treatment: Rehabilitation  ONSET DATE: 04/23/22  SUBJECTIVE:    SUBJECTIVE STATEMENT: Doing well today. Had staples removed yesterday. Leg is a little stiff this morning. Working on [employer], but " a little slow"  Eval: Patient presents to therapy s/p LT TKA on 04/23/22. Pain is moderate. He is moving slowly. He is walking with RW. He has been having difficulty sleeping at night because it is hard to get comfortable. He is taking pain meds PRN. He is using CPM as instructed. He had home health therapy for 2 weeks.   PERTINENT HISTORY: LT TKA 04/23/22, lumbar surgery, bilat foot surgery   PAIN:  Are you having pain? Yes: NPRS scale: 5/10 Pain location: LT knee Pain description: aching, sore  Aggravating factors: WB, walking  Relieving factors: pain meds, rest, icing   PRECAUTIONS: None  WEIGHT BEARING RESTRICTIONS: No  FALLS:  Has patient fallen in last 6 months? No  LIVING ENVIRONMENT: Lives with: lives with their spouse Lives in: House/apartment Stairs: Yes: External: 1 steps; none Has following equipment at home: Single point cane and Walker - 2 wheeled  OCCUPATION: Retired   PLOF: Independent with basic ADLs  PATIENT GOALS: Get better and get out of it (therapy)  NEXT MD VISIT: 05/07/22  OBJECTIVE:   DIAGNOSTIC FINDINGS: NA  PATIENT SURVEYS:  FOTO 52% function   COGNITION: Overall cognitive status: Within functional limits for tasks assessed     SENSATION: WFL  EDEMA:  Min/ mod   LOWER EXTREMITY ROM:  Active ROM Right eval Left eval  Hip flexion    Hip extension    Hip abduction    Hip adduction    Hip internal rotation    Hip external rotation    Knee flexion 118 72  Knee extension 0 -4  Ankle dorsiflexion    Ankle plantarflexion    Ankle inversion    Ankle eversion     (Blank rows = not tested)  LOWER EXTREMITY MMT:  MMT Right eval Left eval  Hip flexion    Hip extension    Hip abduction    Hip adduction    Hip internal rotation    Hip external rotation    Knee flexion  4  Knee extension  4   Ankle dorsiflexion  5  Ankle plantarflexion    Ankle inversion    Ankle eversion     (Blank rows = not tested)   FUNCTIONAL TESTS:  2 minute walk test: 200 feet with RW   GAIT:  Decreased stride, decreased LT knee and hip flexion, slightly antalgic, out-toeing bilateral   TODAY'S TREATMENT:  DATE:  05/09/22 Quad set 20 x 5"  SLR 2 x 10  Glute set 20 x 5"  Heel slides AAROM with strap 15 x 5"  LT knee AROM (-1 to 76 deg)   Manual edema massage LT knee   05/07/22 Eval    PATIENT EDUCATION:  Education details: on eval findings, POC and HEP  Person educated: Patient Education method: Explanation Education comprehension: verbalized understanding  HOME EXERCISE PROGRAM: Access Code: [email or web address] URL: [email or web address] Date: 05/07/2022 Prepared by: Josue Hector  Exercises - Supine Quad Set  - 3 x daily - 7 x weekly - 2 sets - 10 reps - 5 sec hold - Active Straight Leg Raise with Quad Set  - 3 x daily - 7 x weekly - 2 sets - 10 reps - Supine Heel Slide  - 3 x daily - 7 x weekly - 2 sets - 10 reps - 5 sec hold - Supine Ankle Pumps  - 3 x daily - 7 x weekly - 2 sets - 10 reps - Hooklying Gluteal Sets  - 3 x daily - 7 x weekly - 2 sets - 10 reps - 5 sec hold - Supine Knee Extension Mobilization with Weight  - 3 x daily - 7 x weekly - 1 sets - 1 reps - 3-5 minute hold  ASSESSMENT:  CLINICAL IMPRESSION: Patient tolerated session well today. Initiated treatment. Patient with moderate edema about LT lateral thigh. Performed manual edema massage with good result. Patient noting decreased pain and improved mobility following. Performed glute and quad isometric strengthening exercises. Patient cued on hold times for improved muscle activation and strengthening. Patient continues to be limited in knee ROM, though improved following AAROM  stretches and manual. Patient will continue to benefit from skilled therapy services to reduce remaining deficits and improve functional ability.    OBJECTIVE IMPAIRMENTS: Abnormal gait, decreased activity tolerance, decreased balance, decreased endurance, decreased mobility, difficulty walking, decreased ROM, decreased strength, hypomobility, increased edema, impaired flexibility, improper body mechanics, and pain.   ACTIVITY LIMITATIONS: carrying, lifting, bending, sitting, standing, squatting, sleeping, stairs, transfers, bed mobility, bathing, and locomotion level  PARTICIPATION LIMITATIONS: meal prep, cleaning, laundry, driving, shopping, community activity, and yard work  PERSONAL FACTORS:  None  are also affecting patient's functional outcome.   REHAB POTENTIAL: Good  CLINICAL DECISION MAKING: Stable/uncomplicated  EVALUATION COMPLEXITY: Low   GOALS: SHORT TERM GOALS: Target date: 05/28/2022  Patient will be independent with initial HEP and self-management strategies to improve functional outcomes Baseline:  Goal status: INITIAL   LONG TERM GOALS: Target date: 06/18/2022  Patient will be independent with advanced HEP and self-management strategies to improve functional outcomes Baseline:  Goal status: INITIAL  2.  Patient will improve FOTO score to predicted value to indicate improvement in functional outcomes Baseline: 52% Goal status: INITIAL  3.  Patient will have LT knee AROM 0-120 degrees to improve functional mobility and facilitate squatting to pick up items from floor. Baseline: -4 to 72 Goal status: INITIAL  4. Patient will have equal to or > 4+/5 MMT throughout LLE to improve ability to perform functional mobility, stair ambulation and ADLs.  Baseline: See MMT Goal status: INITIAL  5. Patient will be able to ambulate at least 325 feet during 2MWT with LRAD to demonstrate improved ability to perform functional mobility and associated tasks. Baseline: 200 feet  with RW  Goal status: INITIAL  PLAN:  PT FREQUENCY: 3x/week  PT DURATION: 6 weeks  PLANNED INTERVENTIONS: Therapeutic exercises, Therapeutic activity, Neuromuscular  re-education, Balance training, Gait training, Patient/Family education, Joint manipulation, Joint mobilization, Stair training, Aquatic Therapy, Dry Needling, Electrical stimulation, Spinal manipulation, Spinal mobilization, Cryotherapy, Moist heat, scar mobilization, Taping, Traction, Ultrasound, Biofeedback, Ionotophoresis '4mg'$ /ml Dexamethasone, and Manual therapy.   PLAN FOR NEXT SESSION: Progress knee ROM and quad/ glute strength as tolerated.    2:16 PM, 05/09/22 Josue Hector PT DPT  Physical Therapist with [facility]  [phone]

## 2022-05-14 ENCOUNTER — Encounter (HOSPITAL_COMMUNITY): Payer: Self-pay | Admitting: Physical Therapy

## 2022-05-14 ENCOUNTER — Ambulatory Visit (HOSPITAL_COMMUNITY): Payer: PPO | Attending: Orthopedic Surgery | Admitting: Physical Therapy

## 2022-05-14 DIAGNOSIS — M25562 Pain in left knee: Secondary | ICD-10-CM | POA: Diagnosis present

## 2022-05-14 DIAGNOSIS — M25662 Stiffness of left knee, not elsewhere classified: Secondary | ICD-10-CM | POA: Diagnosis present

## 2022-05-14 DIAGNOSIS — R2689 Other abnormalities of gait and mobility: Secondary | ICD-10-CM | POA: Diagnosis present

## 2022-05-14 NOTE — Therapy (Signed)
OUTPATIENT PHYSICAL THERAPY LOWER EXTREMITY EVALUATION   Patient Name: Ethan Oliver MRN: [id number] DOB:Dec 03, 1955, 67 y.o., male Today's Date: 05/14/2022  END OF SESSION:  PT End of Session - 05/14/22 1034     Visit Number 3    Number of Visits 18    Date for PT Re-Evaluation 06/18/22    Authorization Type [employer]    Progress Note Due on Visit 10    PT Start Time 1031    PT Stop Time 1112    PT Time Calculation (min) 41 min    Activity Tolerance Patient tolerated treatment well    Behavior During Therapy WFL for tasks assessed/performed             Past Medical History:  Diagnosis Date   Arthritis    Chronic back pain    Depression    Diabetes mellitus without complication ([place])    History of gout    History of kidney stones    Hypercholesteremia    Hypertension    Past Surgical History:  Procedure Laterality Date   ACHILLES TENDON SURGERY Left 12/29/2013   Procedure: PERCUTANEOUS TENDO-ACHILLES LENGTHENING;  Surgeon: Marcheta Grammes, DPM;  Location: [facility];  Service: Podiatry;  Laterality: Left;   CALCANEAL OSTEOTOMY WITH ILIAC CREST BONE GRAFT AND REPAIR [place] TENDON AND ACHILLES TENDON Left 12/29/2013   Procedure: CALCANEOCUBOID JOINT FUSION PERFORMED WITH BONE GRAFT;  Surgeon: Marcheta Grammes, DPM;  Location: [facility];  Service: Podiatry;  Laterality: Left;   CARPAL TUNNEL RELEASE Bilateral    COLONOSCOPY WITH PROPOFOL N/A 05/02/2021   Procedure: COLONOSCOPY WITH PROPOFOL;  Surgeon: Rogene Houston, MD;  [facility];  Service: Endoscopy;  Laterality: N/A;  930   Disk fusion     lumbar   FLEXOR TENOTOMY  Right 02/18/2018   Procedure: PERCUTANEOUS FLEXOR TENOTOMY/APPLICATION OF BK POSTERIOR SPLINT RLE;  Surgeon: Caprice Beaver, DPM;  Location: [facility];  Service: Podiatry;  Laterality: Right;   FOOT ARTHRODESIS Left 12/29/2013   Procedure: TRIPLE ARTHRODESIS FOOT;  Surgeon: Marcheta Grammes, DPM;  Location: [facility];  Service: Podiatry;  Laterality: Left;   FOOT ARTHRODESIS Right 02/18/2018   Procedure: TRIPLE ARTHRODESIS RIGHT FOOT;  Surgeon: Caprice Beaver, DPM;  Location: [facility];  Service: Podiatry;  Laterality: Right;   KNEE SURGERY Right    Surgery on this knee twice.    POLYPECTOMY  05/02/2021   Procedure: POLYPECTOMY INTESTINAL;  Surgeon: Rogene Houston, MD;  [facility];  Service: Endoscopy;;   TOTAL KNEE ARTHROPLASTY Left 04/23/2022   Procedure: TOTAL KNEE ARTHROPLASTY;  Surgeon: Carole Civil, MD;  Location: [facility];  Service: Orthopedics;  Laterality: Left;   Patient Active Problem List   Diagnosis Date Noted   S/P TKR (total knee replacement), left 04/23/22 05/07/2022   Primary osteoarthritis of left knee 04/23/2022   Mood disorder ([place]) 09/12/2021   Postlaminectomy syndrome of lumbar region 09/12/2021   Arthritis of left elbow 06/21/2019   Cubital tunnel syndrome on left 06/21/2019   Inflammation of left elbow 06/21/2019   Arthritis, lumbar spine 08/05/2018   Benign essential hypertension 11/07/2016   Generalized osteoarthritis of multiple sites 11/07/2016   Type 2 diabetes mellitus without complication, without long-term current use of insulin ([place]) 11/07/2016   Diastasis recti 11/07/2016   Pure hypercholesterolemia 11/07/2016   Morbid obesity with BMI of 40.0-44.9, adult ([place]) 12/27/2013   Back pain 12/27/2013   Degeneration of lumbar intervertebral disc 12/27/2013   Lumbar  radiculopathy 12/27/2013   Lumbosacral spondylosis without myelopathy 12/28/2012    PCP: Luciano Cutter DO  REFERRING PROVIDER: Carole Civil, MD  REFERRING DIAG: [phone] (ICD-10-CM) - Unilateral primary osteoarthritis, left knee  THERAPY DIAG:  Left knee pain, unspecified chronicity  Stiffness of left knee, not elsewhere classified  Other abnormalities of gait and mobility  Rationale for Evaluation and Treatment: Rehabilitation  ONSET DATE: 04/23/22  SUBJECTIVE:    SUBJECTIVE STATEMENT: Knee is a little more sore today. Had a hard time sleeping last night. Had staples out last week.   Eval: Patient presents to therapy s/p LT TKA on 04/23/22. Pain is moderate. He is moving slowly. He is walking with RW. He has been having difficulty sleeping at night because it is hard to get comfortable. He is taking pain meds PRN. He is using CPM as instructed. He had home health therapy for 2 weeks.   PERTINENT HISTORY: LT TKA 04/23/22, lumbar surgery, bilat foot surgery   PAIN:  Are you having pain? Yes: NPRS scale: 7/10 Pain location: LT knee Pain description: aching, sore  Aggravating factors: WB, walking  Relieving factors: pain meds, rest, icing   PRECAUTIONS: None  WEIGHT BEARING RESTRICTIONS: No  FALLS:  Has patient fallen in last 6 months? No  LIVING ENVIRONMENT: Lives with: lives with their spouse Lives in: House/apartment Stairs: Yes: External: 1 steps; none Has following equipment at home: Single point cane and Walker - 2 wheeled  OCCUPATION: Retired   PLOF: Independent with basic ADLs  PATIENT GOALS: Get better and get out of it (therapy)  NEXT MD VISIT: 05/07/22  OBJECTIVE:   DIAGNOSTIC FINDINGS: NA  PATIENT SURVEYS:  FOTO 52% function   COGNITION: Overall cognitive status: Within functional limits for tasks assessed     SENSATION: WFL  EDEMA:  Min/ mod   LOWER EXTREMITY ROM:  Active ROM Right eval Left eval  Hip flexion    Hip extension    Hip abduction    Hip adduction    Hip internal rotation    Hip external rotation    Knee flexion 118 72  Knee extension 0 -4  Ankle dorsiflexion    Ankle plantarflexion    Ankle inversion    Ankle eversion     (Blank rows = not tested)  LOWER EXTREMITY MMT:  MMT Right eval Left eval  Hip flexion    Hip extension    Hip abduction    Hip adduction    Hip internal rotation    Hip external rotation    Knee flexion  4  Knee extension  4  Ankle dorsiflexion  5   Ankle plantarflexion    Ankle inversion    Ankle eversion     (Blank rows = not tested)   FUNCTIONAL TESTS:  2 minute walk test: 200 feet with RW   GAIT:  Decreased stride, decreased LT knee and hip flexion, slightly antalgic, out-toeing bilateral   TODAY'S TREATMENT:  DATE:   05/14/22 Supine: Quad set 15 x 5"  SLR 2 x 10  Glute set 10 x 5"  Heel slides AAROM with strap 15 x 5" Bridge x 10 Calf stretch 3 x 30"   Sidelying: Sidelying leg raise 2 x 10   Manual edema massage LT knee  LT knee AROM (-1 to 85 deg)   05/09/22 Quad set 20 x 5"  SLR 2 x 10  Glute set 20 x 5"  Heel slides AAROM with strap 15 x 5"  LT knee AROM (-1 to 76 deg)   Manual edema massage LT knee   05/07/22 Eval    PATIENT EDUCATION:  Education details: on eval findings, POC and HEP  Person educated: Patient Education method: Explanation Education comprehension: verbalized understanding  HOME EXERCISE PROGRAM: Access Code: [email or web address] URL: [email or web address]  05/14/22 - Sidelying Hip Abduction  - 3 x daily - 7 x weekly - 2 sets - 10 reps - Supine Bridge  - 3 x daily - 7 x weekly - 2 sets - 10 reps   Date: 05/07/2022 Prepared by: Josue Hector  Exercises - Supine Quad Set  - 3 x daily - 7 x weekly - 2 sets - 10 reps - 5 sec hold - Active Straight Leg Raise with Quad Set  - 3 x daily - 7 x weekly - 2 sets - 10 reps - Supine Heel Slide  - 3 x daily - 7 x weekly - 2 sets - 10 reps - 5 sec hold - Supine Ankle Pumps  - 3 x daily - 7 x weekly - 2 sets - 10 reps - Hooklying Gluteal Sets  - 3 x daily - 7 x weekly - 2 sets - 10 reps - 5 sec hold - Supine Knee Extension Mobilization with Weight  - 3 x daily - 7 x weekly - 1 sets - 1 reps - 3-5 minute hold  ASSESSMENT:  CLINICAL IMPRESSION: Patient tolerated session well with no increased complaint of  pain. Slowly progressing knee AROM. Good quad activation with quad set and SLR. Able to add bridging with improved knee flexion AROM for LE strengthening. Patient will continue to benefit from skilled therapy services to reduce remaining deficits and improve functional ability.   OBJECTIVE IMPAIRMENTS: Abnormal gait, decreased activity tolerance, decreased balance, decreased endurance, decreased mobility, difficulty walking, decreased ROM, decreased strength, hypomobility, increased edema, impaired flexibility, improper body mechanics, and pain.   ACTIVITY LIMITATIONS: carrying, lifting, bending, sitting, standing, squatting, sleeping, stairs, transfers, bed mobility, bathing, and locomotion level  PARTICIPATION LIMITATIONS: meal prep, cleaning, laundry, driving, shopping, community activity, and yard work  PERSONAL FACTORS:  None  are also affecting patient's functional outcome.   REHAB POTENTIAL: Good  CLINICAL DECISION MAKING: Stable/uncomplicated  EVALUATION COMPLEXITY: Low   GOALS: SHORT TERM GOALS: Target date: 05/28/2022  Patient will be independent with initial HEP and self-management strategies to improve functional outcomes Baseline:  Goal status: INITIAL   LONG TERM GOALS: Target date: 06/18/2022  Patient will be independent with advanced HEP and self-management strategies to improve functional outcomes Baseline:  Goal status: INITIAL  2.  Patient will improve FOTO score to predicted value to indicate improvement in functional outcomes Baseline: 52% Goal status: INITIAL  3.  Patient will have LT knee AROM 0-120 degrees to improve functional mobility and facilitate squatting to pick up items from floor. Baseline: -4 to 72 Goal status: INITIAL  4. Patient will have equal to or > 4+/5 MMT throughout LLE to  improve ability to perform functional mobility, stair ambulation and ADLs.  Baseline: See MMT Goal status: INITIAL  5. Patient will be able to ambulate at least 325  feet during 2MWT with LRAD to demonstrate improved ability to perform functional mobility and associated tasks. Baseline: 200 feet with RW  Goal status: INITIAL  PLAN:  PT FREQUENCY: 3x/week  PT DURATION: 6 weeks  PLANNED INTERVENTIONS: Therapeutic exercises, Therapeutic activity, Neuromuscular re-education, Balance training, Gait training, Patient/Family education, Joint manipulation, Joint mobilization, Stair training, Aquatic Therapy, Dry Needling, Electrical stimulation, Spinal manipulation, Spinal mobilization, Cryotherapy, Moist heat, scar mobilization, Taping, Traction, Ultrasound, Biofeedback, Ionotophoresis '4mg'$ /ml Dexamethasone, and Manual therapy.   PLAN FOR NEXT SESSION: Progress knee ROM and quad/ glute strength as tolerated.    10:35 AM, 05/14/22 Josue Hector PT DPT  Physical Therapist with [facility]  [phone]

## 2022-05-16 ENCOUNTER — Ambulatory Visit (HOSPITAL_COMMUNITY): Payer: PPO | Admitting: Physical Therapy

## 2022-05-16 ENCOUNTER — Encounter (HOSPITAL_COMMUNITY): Payer: Self-pay | Admitting: Physical Therapy

## 2022-05-16 DIAGNOSIS — M25562 Pain in left knee: Secondary | ICD-10-CM | POA: Diagnosis not present

## 2022-05-16 DIAGNOSIS — M25662 Stiffness of left knee, not elsewhere classified: Secondary | ICD-10-CM

## 2022-05-16 DIAGNOSIS — R2689 Other abnormalities of gait and mobility: Secondary | ICD-10-CM

## 2022-05-16 NOTE — Therapy (Signed)
OUTPATIENT PHYSICAL THERAPY LOWER EXTREMITY EVALUATION   Patient Name: Ethan Oliver MRN: [id number] DOB:Jan 28, 1956, 67 y.o., male Today's Date: 05/16/2022  END OF SESSION:  PT End of Session - 05/16/22 1034     Visit Number 4    Number of Visits 18    Date for PT Re-Evaluation 06/18/22    Authorization Type [employer]    Progress Note Due on Visit 10    PT Start Time 1032    PT Stop Time 1112    PT Time Calculation (min) 40 min    Activity Tolerance Patient tolerated treatment well    Behavior During Therapy WFL for tasks assessed/performed             Past Medical History:  Diagnosis Date   Arthritis    Chronic back pain    Depression    Diabetes mellitus without complication ([place])    History of gout    History of kidney stones    Hypercholesteremia    Hypertension    Past Surgical History:  Procedure Laterality Date   ACHILLES TENDON SURGERY Left 12/29/2013   Procedure: PERCUTANEOUS TENDO-ACHILLES LENGTHENING;  Surgeon: Marcheta Grammes, DPM;  Location: [facility];  Service: Podiatry;  Laterality: Left;   CALCANEAL OSTEOTOMY WITH ILIAC CREST BONE GRAFT AND REPAIR [place] TENDON AND ACHILLES TENDON Left 12/29/2013   Procedure: CALCANEOCUBOID JOINT FUSION PERFORMED WITH BONE GRAFT;  Surgeon: Marcheta Grammes, DPM;  Location: [facility];  Service: Podiatry;  Laterality: Left;   CARPAL TUNNEL RELEASE Bilateral    COLONOSCOPY WITH PROPOFOL N/A 05/02/2021   Procedure: COLONOSCOPY WITH PROPOFOL;  Surgeon: Rogene Houston, MD;  [facility];  Service: Endoscopy;  Laterality: N/A;  930   Disk fusion     lumbar   FLEXOR TENOTOMY  Right 02/18/2018   Procedure: PERCUTANEOUS FLEXOR TENOTOMY/APPLICATION OF BK POSTERIOR SPLINT RLE;  Surgeon: Caprice Beaver, DPM;  Location: [facility];  Service: Podiatry;  Laterality: Right;   FOOT ARTHRODESIS Left 12/29/2013   Procedure: TRIPLE ARTHRODESIS FOOT;  Surgeon: Marcheta Grammes, DPM;  [facility]: [facility];  Service: Podiatry;  Laterality: Left;   FOOT ARTHRODESIS Right 02/18/2018   Procedure: TRIPLE ARTHRODESIS RIGHT FOOT;  Surgeon: Caprice Beaver, DPM;  Location: [facility];  Service: Podiatry;  Laterality: Right;   KNEE SURGERY Right    Surgery on this knee twice.    POLYPECTOMY  05/02/2021   Procedure: POLYPECTOMY INTESTINAL;  Surgeon: Rogene Houston, MD;  [facility];  Service: Endoscopy;;   TOTAL KNEE ARTHROPLASTY Left 04/23/2022   Procedure: TOTAL KNEE ARTHROPLASTY;  Surgeon: Carole Civil, MD;  Location: [facility];  Service: Orthopedics;  Laterality: Left;   Patient Active Problem List   Diagnosis Date Noted   S/P TKR (total knee replacement), left 04/23/22 05/07/2022   Primary osteoarthritis of left knee 04/23/2022   Mood disorder ([place]) 09/12/2021   Postlaminectomy syndrome of lumbar region 09/12/2021   Arthritis of left elbow 06/21/2019   Cubital tunnel syndrome on left 06/21/2019   Inflammation of left elbow 06/21/2019   Arthritis, lumbar spine 08/05/2018   Benign essential hypertension 11/07/2016   Generalized osteoarthritis of multiple sites 11/07/2016   Type 2 diabetes mellitus without complication, without long-term current use of insulin ([place]) 11/07/2016   Diastasis recti 11/07/2016   Pure hypercholesterolemia 11/07/2016   Morbid obesity with BMI of 40.0-44.9, adult ([place]) 12/27/2013   Back pain 12/27/2013   Degeneration of lumbar intervertebral disc 12/27/2013   Lumbar  radiculopathy 12/27/2013   Lumbosacral spondylosis without myelopathy 12/28/2012    PCP: Luciano Cutter DO  REFERRING PROVIDER: Carole Civil, MD  REFERRING DIAG: [phone] (ICD-10-CM) - Unilateral primary osteoarthritis, left knee  THERAPY DIAG:  Left knee pain, unspecified chronicity  Stiffness of left knee, not elsewhere classified  Other abnormalities of gait and mobility  Rationale for Evaluation and Treatment: Rehabilitation  ONSET DATE: 04/23/22  SUBJECTIVE:    SUBJECTIVE STATEMENT: Knee is doing better. Not as much pain today. Just a little stiff  Eval: Patient presents to therapy s/p LT TKA on 04/23/22. Pain is moderate. He is moving slowly. He is walking with RW. He has been having difficulty sleeping at night because it is hard to get comfortable. He is taking pain meds PRN. He is using CPM as instructed. He had home health therapy for 2 weeks.   PERTINENT HISTORY: LT TKA 04/23/22, lumbar surgery, bilat foot surgery   PAIN:  Are you having pain? Yes: NPRS scale: 4/10 Pain location: LT knee Pain description: aching, sore  Aggravating factors: WB, walking  Relieving factors: pain meds, rest, icing   PRECAUTIONS: None  WEIGHT BEARING RESTRICTIONS: No  FALLS:  Has patient fallen in last 6 months? No  LIVING ENVIRONMENT: Lives with: lives with their spouse Lives in: House/apartment Stairs: Yes: External: 1 steps; none Has following equipment at home: Single point cane and Walker - 2 wheeled  OCCUPATION: Retired   PLOF: Independent with basic ADLs  PATIENT GOALS: Get better and get out of it (therapy)  NEXT MD VISIT: 05/07/22  OBJECTIVE:   DIAGNOSTIC FINDINGS: NA  PATIENT SURVEYS:  FOTO 52% function   COGNITION: Overall cognitive status: Within functional limits for tasks assessed     SENSATION: WFL  EDEMA:  Min/ mod   LOWER EXTREMITY ROM:  Active ROM Right eval Left eval  Hip flexion    Hip extension    Hip abduction    Hip adduction    Hip internal rotation    Hip external rotation    Knee flexion 118 72  Knee extension 0 -4  Ankle dorsiflexion    Ankle plantarflexion    Ankle inversion    Ankle eversion     (Blank rows = not tested)  LOWER EXTREMITY MMT:  MMT Right eval Left eval  Hip flexion    Hip extension    Hip abduction    Hip adduction    Hip internal rotation    Hip external rotation    Knee flexion  4  Knee extension  4  Ankle dorsiflexion  5  Ankle plantarflexion    Ankle  inversion    Ankle eversion     (Blank rows = not tested)   FUNCTIONAL TESTS:  2 minute walk test: 200 feet with RW   GAIT:  Decreased stride, decreased LT knee and hip flexion, slightly antalgic, out-toeing bilateral   TODAY'S TREATMENT:  DATE:  05/16/22 Supine: Quad set 15 x 5"  SLR 2 x 10  Heel slides AAROM with strap 15 x 5" Bridge 2 x 10  Calf stretch (slant board) 3 x 30"  Heel raise 2 x 10  Sidelying:  Sidelying leg raise 2 x 10   Manual edema massage LT knee; PROM LT knee flexion supine   LT knee AROM (-1 to 88 deg)    05/14/22 Supine: Quad set 15 x 5"  SLR 2 x 10  Glute set 10 x 5"  Heel slides AAROM with strap 15 x 5" Bridge x 10 Calf stretch 3 x 30"   Sidelying: Sidelying leg raise 2 x 10   Manual edema massage LT knee  LT knee AROM (-1 to 85 deg)   05/09/22 Quad set 20 x 5"  SLR 2 x 10  Glute set 20 x 5"  Heel slides AAROM with strap 15 x 5"  LT knee AROM (-1 to 76 deg)   Manual edema massage LT knee   05/07/22 Eval    PATIENT EDUCATION:  Education details: on eval findings, POC and HEP  Person educated: Patient Education method: Explanation Education comprehension: verbalized understanding  HOME EXERCISE PROGRAM: Access Code: [email or web address] URL: [email or web address] 05/16/22 - Heel Raises with Counter Support  - 3 x daily - 7 x weekly - 2 sets - 10 reps  05/14/22 - Sidelying Hip Abduction  - 3 x daily - 7 x weekly - 2 sets - 10 reps - Supine Bridge  - 3 x daily - 7 x weekly - 2 sets - 10 reps  Date: 05/07/2022 Prepared by: Josue Hector  Exercises - Supine Quad Set  - 3 x daily - 7 x weekly - 2 sets - 10 reps - 5 sec hold - Active Straight Leg Raise with Quad Set  - 3 x daily - 7 x weekly - 2 sets - 10 reps - Supine Heel Slide  - 3 x daily - 7 x weekly - 2 sets - 10 reps - 5 sec hold - Supine Ankle  Pumps  - 3 x daily - 7 x weekly - 2 sets - 10 reps - Hooklying Gluteal Sets  - 3 x daily - 7 x weekly - 2 sets - 10 reps - 5 sec hold - Supine Knee Extension Mobilization with Weight  - 3 x daily - 7 x weekly - 1 sets - 1 reps - 3-5 minute hold  ASSESSMENT:  CLINICAL IMPRESSION: Patient showings steady progress. Steadily improving knee AROM. Added standing calf stretch and heel raise. Patient educated on purpose and function and added to HEP with handout. Decreased pain and swelling following manual treatment. Patient will continue to benefit from skilled therapy services to reduce remaining deficits and improve functional ability.    OBJECTIVE IMPAIRMENTS: Abnormal gait, decreased activity tolerance, decreased balance, decreased endurance, decreased mobility, difficulty walking, decreased ROM, decreased strength, hypomobility, increased edema, impaired flexibility, improper body mechanics, and pain.   ACTIVITY LIMITATIONS: carrying, lifting, bending, sitting, standing, squatting, sleeping, stairs, transfers, bed mobility, bathing, and locomotion level  PARTICIPATION LIMITATIONS: meal prep, cleaning, laundry, driving, shopping, community activity, and yard work  PERSONAL FACTORS:  None  are also affecting patient's functional outcome.   REHAB POTENTIAL: Good  CLINICAL DECISION MAKING: Stable/uncomplicated  EVALUATION COMPLEXITY: Low   GOALS: SHORT TERM GOALS: Target date: 05/28/2022  Patient will be independent with initial HEP and self-management strategies to improve functional outcomes Baseline:  Goal status: INITIAL  LONG TERM GOALS: Target date: 06/18/2022  Patient will be independent with advanced HEP and self-management strategies to improve functional outcomes Baseline:  Goal status: INITIAL  2.  Patient will improve FOTO score to predicted value to indicate improvement in functional outcomes Baseline: 52% Goal status: INITIAL  3.  Patient will have LT knee AROM 0-120  degrees to improve functional mobility and facilitate squatting to pick up items from floor. Baseline: -4 to 72 Goal status: INITIAL  4. Patient will have equal to or > 4+/5 MMT throughout LLE to improve ability to perform functional mobility, stair ambulation and ADLs.  Baseline: See MMT Goal status: INITIAL  5. Patient will be able to ambulate at least 325 feet during 2MWT with LRAD to demonstrate improved ability to perform functional mobility and associated tasks. Baseline: 200 feet with RW  Goal status: INITIAL  PLAN:  PT FREQUENCY: 3x/week  PT DURATION: 6 weeks  PLANNED INTERVENTIONS: Therapeutic exercises, Therapeutic activity, Neuromuscular re-education, Balance training, Gait training, Patient/Family education, Joint manipulation, Joint mobilization, Stair training, Aquatic Therapy, Dry Needling, Electrical stimulation, Spinal manipulation, Spinal mobilization, Cryotherapy, Moist heat, scar mobilization, Taping, Traction, Ultrasound, Biofeedback, Ionotophoresis '4mg'$ /ml Dexamethasone, and Manual therapy.   PLAN FOR NEXT SESSION: Progress knee ROM and quad/ glute strength as tolerated. Add bike and tandem stance    10:47 AM, 05/16/22 Josue Hector PT DPT  Physical Therapist with [facility]  [phone]

## 2022-05-21 ENCOUNTER — Ambulatory Visit (HOSPITAL_COMMUNITY): Payer: PPO | Admitting: Physical Therapy

## 2022-05-21 DIAGNOSIS — R2689 Other abnormalities of gait and mobility: Secondary | ICD-10-CM

## 2022-05-21 DIAGNOSIS — M25562 Pain in left knee: Secondary | ICD-10-CM | POA: Diagnosis not present

## 2022-05-21 DIAGNOSIS — M25662 Stiffness of left knee, not elsewhere classified: Secondary | ICD-10-CM

## 2022-05-21 NOTE — Therapy (Signed)
OUTPATIENT PHYSICAL THERAPY LOWER EXTREMITY EVALUATION   Patient Name: Ethan Oliver MRN: [id number] DOB:[birth year], 67 y.o., male Today's Date: 05/21/2022  END OF SESSION:  PT End of Session - 05/21/22 0946     Visit Number 5    Number of Visits 18    Date for PT Re-Evaluation 06/18/22    Authorization Type [employer]    Progress Note Due on Visit 10    PT Start Time [phone]    PT Stop Time 1027    PT Time Calculation (min) 40 min    Activity Tolerance Patient tolerated treatment well    Behavior During Therapy [facility] for tasks assessed/performed             Past Medical History:  Diagnosis Date   Arthritis    Chronic back pain    Depression    Diabetes mellitus without complication ([place])    History of gout    History of kidney stones    Hypercholesteremia    Hypertension    Past Surgical History:  Procedure Laterality Date   ACHILLES TENDON SURGERY Left 12/29/2013   Procedure: PERCUTANEOUS TENDO-ACHILLES LENGTHENING;  Surgeon: Marcheta Grammes, DPM;  Location: [facility] OR[facility];  Service: Podiatry;  Laterality: Left;   CALCANEAL OSTEOTOMY WITH ILIAC CREST BONE GRAFT AND REPAIR [place] TENDON AND ACHILLES TENDON Left 12/29/2013   Procedure: CALCANEOCUBOID JOINT FUSION PERFORMED WITH BONE GRAFT;  Surgeon: Marcheta Grammes, DPM;  Location: [facility]S;  Service: Podiatry;  Laterality: Left;   CARPAL TUNNEL RELEASE Bilateral    COLONOSCOPY WITH PROPOFOL N/A 05/02/2021   Procedure: COLONOSCOPY WITH PROPOFOL;  Surgeon: Rogene Houston, MD;  Location: [facility];  Service: Endoscopy;  Laterality: N/A;  930   Disk fusion     lumbar   FLEXOR TENOTOMY  Right 02/18/2018   Procedure: PERCUTANEOUS FLEXOR TENOTOMY/APPLICATION OF BK POSTERIOR SPLINT RLE;  Surgeon: Caprice Beaver, DPM;  Location: [facility];  Service: Podiatry;  Laterality: Right;   FOOT ARTHRODESIS Left 12/29/2013   Procedure: TRIPLE ARTHRODESIS FOOT;  Surgeon: Marcheta Grammes, DPM;  Location: [facility]  [facility];  Service: Podiatry;  Laterality: Left;   FOOT ARTHRODESIS Right 02/18/2018   Procedure: TRIPLE ARTHRODESIS RIGHT FOOT;  Surgeon: Caprice Beaver, DPM;  Location: [facility] ORS;  Service: Podiatry;  Laterality: Right;   KNEE SURGERY Right    Surgery on this knee twice.    POLYPECTOMY  05/02/2021   Procedure: POLYPECTOMY INTESTINAL;  Surgeon: Rogene Houston, MD;  Location: [facility];  Service: Endoscopy;;   TOTAL KNEE ARTHROPLASTY Left 04/23/2022   Procedure: TOTAL KNEE ARTHROPLASTY;  Surgeon: Carole Civil, MD;  Location: [facility];  Service: Orthopedics;  Laterality: Left;   Patient Active Problem List   Diagnosis Date Noted   S/P TKR (total knee replacement), left 04/23/22 05/07/2022   Primary osteoarthritis of left knee 04/23/2022   Mood disorder ([place]) 09/12/2021   Postlaminectomy syndrome of lumbar region 09/12/2021   Arthritis of left elbow 06/21/2019   Cubital tunnel syndrome on left 06/21/2019   Inflammation of left elbow 06/21/2019   Arthritis, lumbar spine 08/05/2018   Benign essential hypertension 11/07/2016   Generalized osteoarthritis of multiple sites 11/07/2016   Type 2 diabetes mellitus without complication, without long-term current use of insulin ([place]) 11/07/2016   Diastasis recti 11/07/2016   Pure hypercholesterolemia 11/07/2016   Morbid obesity with BMI of 40.0-44.9, adult ([place]) 12/27/2013   Back pain 12/27/2013   Degeneration of lumbar intervertebral disc 12/27/2013   Lumbar  radiculopathy 12/27/2013   Lumbosacral spondylosis without myelopathy 12/28/2012    PCP: Luciano Cutter DO  REFERRING PROVIDER: Carole Civil, MD  REFERRING DIAG: [phone] (ICD-10-CM) - Unilateral primary osteoarthritis, left knee  THERAPY DIAG:  Left knee pain, unspecified chronicity  Stiffness of left knee, not elsewhere classified  Other abnormalities of gait and mobility  Rationale for Evaluation and Treatment: Rehabilitation  ONSET DATE: 04/23/22  SUBJECTIVE:    SUBJECTIVE STATEMENT: Doing ok, just a little sore.   Eval: Patient presents to therapy s/p LT TKA on 04/23/22. Pain is moderate. He is moving slowly. He is walking with RW. He has been having difficulty sleeping at night because it is hard to get comfortable. He is taking pain meds PRN. He is using CPM as instructed. He had home health therapy for 2 weeks.   PERTINENT HISTORY: LT TKA 04/23/22, lumbar surgery, bilat foot surgery   PAIN:  Are you having pain? Yes: NPRS scale: 4/10 Pain location: LT knee Pain description: aching, sore  Aggravating factors: WB, walking  Relieving factors: pain meds, rest, icing   PRECAUTIONS: None  WEIGHT BEARING RESTRICTIONS: No  FALLS:  Has patient fallen in last 6 months? No  LIVING ENVIRONMENT: Lives with: lives with their spouse Lives in: House/apartment Stairs: Yes: External: 1 steps; none Has following equipment at home: Single point cane and Walker - 2 wheeled  OCCUPATION: Retired   PLOF: Independent with basic ADLs  PATIENT GOALS: Get better and get out of it (therapy)  NEXT MD VISIT: 05/07/22  OBJECTIVE:   DIAGNOSTIC FINDINGS: NA  PATIENT SURVEYS:  FOTO 52% function   COGNITION: Overall cognitive status: Within functional limits for tasks assessed     SENSATION: WFL  EDEMA:  Min/ mod   LOWER EXTREMITY ROM:  Active ROM Right eval Left eval  Hip flexion    Hip extension    Hip abduction    Hip adduction    Hip internal rotation    Hip external rotation    Knee flexion 118 72  Knee extension 0 -4  Ankle dorsiflexion    Ankle plantarflexion    Ankle inversion    Ankle eversion     (Blank rows = not tested)  LOWER EXTREMITY MMT:  MMT Right eval Left eval  Hip flexion    Hip extension    Hip abduction    Hip adduction    Hip internal rotation    Hip external rotation    Knee flexion  4  Knee extension  4  Ankle dorsiflexion  5  Ankle plantarflexion    Ankle inversion    Ankle eversion      (Blank rows = not tested)   FUNCTIONAL TESTS:  2 minute walk test: 200 feet with RW   GAIT:  Decreased stride, decreased LT knee and hip flexion, slightly antalgic, out-toeing bilateral   TODAY'S TREATMENT:  DATE:  05/20/22 Supine: Quad set 15 x 5" (with heel prop for knee extension)  SLR 2 x 10  Heel slides AAROM with strap 15 x 5" Bridge 2 x 10  Sit to stand from elevated mat x 10 no UE  Heel raise 2 x 10 Tandem stance x30"  Gait training with SPC 1 RT in clinic   Manual PROM knee flexion seated at EOB with contract/ relax  Nustep (seat 9) 5 min EOS for mobility   LT knee AROM (0 to 88 deg)     05/16/22 Supine: Quad set 15 x 5"  SLR 2 x 10  Heel slides AAROM with strap 15 x 5" Bridge 2 x 10  Calf stretch (slant board) 3 x 30"  Heel raise 2 x 10  Sidelying:  Sidelying leg raise 2 x 10   Manual edema massage LT knee; PROM LT knee flexion supine   LT knee AROM (-1 to 88 deg)    05/14/22 Supine: Quad set 15 x 5"  SLR 2 x 10  Glute set 10 x 5"  Heel slides AAROM with strap 15 x 5" Bridge x 10 Calf stretch 3 x 30"   Sidelying: Sidelying leg raise 2 x 10   Manual edema massage LT knee  LT knee AROM (-1 to 85 deg)    PATIENT EDUCATION:  Education details: on eval findings, POC and HEP  Person educated: Patient Education method: Explanation Education comprehension: verbalized understanding  HOME EXERCISE PROGRAM: Access Code: [email or web address] URL: [email or web address] 05/21/22 - Sit to Stand with Hands on Knees  - 2-3 x daily - 7 x weekly - 1-2 sets - 10 reps - Standing Tandem Balance with Counter Support  - 2-3 x daily - 7 x weekly - 1 sets - 3 reps - 30 second hold  05/16/22 - Heel Raises with Counter Support  - 3 x daily - 7 x weekly - 2 sets - 10 reps  05/14/22 - Sidelying Hip Abduction  - 3 x daily - 7 x weekly - 2 sets  - 10 reps - Supine Bridge  - 3 x daily - 7 x weekly - 2 sets - 10 reps  Date: 05/07/2022 Prepared by: Josue Hector  Exercises - Supine Quad Set  - 3 x daily - 7 x weekly - 2 sets - 10 reps - 5 sec hold - Active Straight Leg Raise with Quad Set  - 3 x daily - 7 x weekly - 2 sets - 10 reps - Supine Heel Slide  - 3 x daily - 7 x weekly - 2 sets - 10 reps - 5 sec hold - Supine Ankle Pumps  - 3 x daily - 7 x weekly - 2 sets - 10 reps - Hooklying Gluteal Sets  - 3 x daily - 7 x weekly - 2 sets - 10 reps - 5 sec hold - Supine Knee Extension Mobilization with Weight  - 3 x daily - 7 x weekly - 1 sets - 1 reps - 3-5 minute hold  ASSESSMENT:  CLINICAL IMPRESSION: Patient progressing well. Initiated gait training with SPC today. Patient educated on sequencing with good return. Also added sit to stands for LE strengthening. Patient remains limited by stiffness and reduced knee AROM which continues to negatively impact function. Patient will continue to benefit from skilled therapy services to reduce remaining deficits and improve functional ability.    OBJECTIVE IMPAIRMENTS: Abnormal gait, decreased activity tolerance, decreased balance, decreased endurance, decreased mobility, difficulty walking, decreased  ROM, decreased strength, hypomobility, increased edema, impaired flexibility, improper body mechanics, and pain.   ACTIVITY LIMITATIONS: carrying, lifting, bending, sitting, standing, squatting, sleeping, stairs, transfers, bed mobility, bathing, and locomotion level  PARTICIPATION LIMITATIONS: meal prep, cleaning, laundry, driving, shopping, community activity, and yard work  PERSONAL FACTORS:  None  are also affecting patient's functional outcome.   REHAB POTENTIAL: Good  CLINICAL DECISION MAKING: Stable/uncomplicated  EVALUATION COMPLEXITY: Low   GOALS: SHORT TERM GOALS: Target date: 05/28/2022  Patient will be independent with initial HEP and self-management strategies to improve  functional outcomes Baseline:  Goal status: INITIAL   LONG TERM GOALS: Target date: 06/18/2022  Patient will be independent with advanced HEP and self-management strategies to improve functional outcomes Baseline:  Goal status: INITIAL  2.  Patient will improve FOTO score to predicted value to indicate improvement in functional outcomes Baseline: 52% Goal status: INITIAL  3.  Patient will have LT knee AROM 0-120 degrees to improve functional mobility and facilitate squatting to pick up items from floor. Baseline: -4 to 72 Goal status: INITIAL  4. Patient will have equal to or > 4+/5 MMT throughout LLE to improve ability to perform functional mobility, stair ambulation and ADLs.  Baseline: See MMT Goal status: INITIAL  5. Patient will be able to ambulate at least 325 feet during 2MWT with LRAD to demonstrate improved ability to perform functional mobility and associated tasks. Baseline: 200 feet with RW  Goal status: INITIAL  PLAN:  PT FREQUENCY: 3x/week  PT DURATION: 6 weeks  PLANNED INTERVENTIONS: Therapeutic exercises, Therapeutic activity, Neuromuscular re-education, Balance training, Gait training, Patient/Family education, Joint manipulation, Joint mobilization, Stair training, Aquatic Therapy, Dry Needling, Electrical stimulation, Spinal manipulation, Spinal mobilization, Cryotherapy, Moist heat, scar mobilization, Taping, Traction, Ultrasound, Biofeedback, Ionotophoresis '4mg'$ /ml Dexamethasone, and Manual therapy.   PLAN FOR NEXT SESSION: Progress knee ROM and quad/ glute strength as tolerated. Add step ups and bike when ready.   10:28 AM, 05/21/22 Josue Hector PT DPT  Physical Therapist with [facility]  [phone]

## 2022-05-23 ENCOUNTER — Ambulatory Visit (HOSPITAL_COMMUNITY): Payer: PPO | Admitting: Physical Therapy

## 2022-05-23 DIAGNOSIS — M25562 Pain in left knee: Secondary | ICD-10-CM

## 2022-05-23 DIAGNOSIS — R2689 Other abnormalities of gait and mobility: Secondary | ICD-10-CM

## 2022-05-23 DIAGNOSIS — M25662 Stiffness of left knee, not elsewhere classified: Secondary | ICD-10-CM

## 2022-05-23 NOTE — Therapy (Signed)
OUTPATIENT PHYSICAL THERAPY LOWER EXTREMITY EVALUATION   Patient Name: Ethan Oliver MRN: [id number] DOB:05-Sep-1955, 67 y.o., male Today's Date: 05/23/2022  END OF SESSION:  PT End of Session - 05/23/22 0942     Visit Number 6    Number of Visits 18    Date for PT Re-Evaluation 06/18/22    Authorization Type [employer]    Progress Note Due on Visit 10    PT Start Time 0944    PT Stop Time 1025    PT Time Calculation (min) 41 min    Activity Tolerance Patient tolerated treatment well    Behavior During Therapy [facility] for tasks assessed/performed             Past Medical History:  Diagnosis Date   Arthritis    Chronic back pain    Depression    Diabetes mellitus without complication ([place])    History of gout    History of kidney stones    Hypercholesteremia    Hypertension    Past Surgical History:  Procedure Laterality Date   ACHILLES TENDON SURGERY Left 12/29/2013   Procedure: PERCUTANEOUS TENDO-ACHILLES LENGTHENING;  Surgeon: Marcheta Grammes, DPM;  Location: [facility];  Service: Podiatry;  Laterality: Left;   CALCANEAL OSTEOTOMY WITH ILIAC CREST BONE GRAFT AND REPAIR [place] TENDON AND ACHILLES TENDON Left 12/29/2013   Procedure: CALCANEOCUBOID JOINT FUSION PERFORMED WITH BONE GRAFT;  Surgeon: Marcheta Grammes, DPM;  Location: [facility];  Service: Podiatry;  Laterality: Left;   CARPAL TUNNEL RELEASE Bilateral    COLONOSCOPY WITH PROPOFOL N/A 05/02/2021   Procedure: COLONOSCOPY WITH PROPOFOL;  Surgeon: Rogene Houston, MD;  Location: [facility];  Service: Endoscopy;  Laterality: N/A;  930   Disk fusion     lumbar   FLEXOR TENOTOMY  Right 02/18/2018   Procedure: PERCUTANEOUS FLEXOR TENOTOMY/APPLICATION OF BK POSTERIOR SPLINT RLE;  Surgeon: Caprice Beaver, DPM;  Location: [facility];  Service: Podiatry;  Laterality: Right;   FOOT ARTHRODESIS Left 12/29/2013   Procedure: TRIPLE ARTHRODESIS FOOT;  Surgeon: Marcheta Grammes, DPM;  Location: [facility];  Service: Podiatry;  Laterality: Left;   FOOT ARTHRODESIS Right 02/18/2018   Procedure: TRIPLE ARTHRODESIS RIGHT FOOT;  Surgeon: Caprice Beaver, DPM;  Location: [facility];  Service: Podiatry;  Laterality: Right;   KNEE SURGERY Right    Surgery on this knee twice.    POLYPECTOMY  05/02/2021   Procedure: POLYPECTOMY INTESTINAL;  Surgeon: Rogene Houston, MD;  Location: [facility];  Service: Endoscopy;;   TOTAL KNEE ARTHROPLASTY Left 04/23/2022   Procedure: TOTAL KNEE ARTHROPLASTY;  Surgeon: Carole Civil, MD;  Location: [facility];  Service: Orthopedics;  Laterality: Left;   Patient Active Problem List   Diagnosis Date Noted   S/P TKR (total knee replacement), left 04/23/22 05/07/2022   Primary osteoarthritis of left knee 04/23/2022   Mood disorder ([place]) 09/12/2021   Postlaminectomy syndrome of lumbar region 09/12/2021   Arthritis of left elbow 06/21/2019   Cubital tunnel syndrome on left 06/21/2019   Inflammation of left elbow 06/21/2019   Arthritis, lumbar spine 08/05/2018   Benign essential hypertension 11/07/2016   Generalized osteoarthritis of multiple sites 11/07/2016   Type 2 diabetes mellitus without complication, without long-term current use of insulin ([place]) 11/07/2016   Diastasis recti 11/07/2016   Pure hypercholesterolemia 11/07/2016   Morbid obesity with BMI of 40.0-44.9, adult ([place]) 12/27/2013   Back pain 12/27/2013   Degeneration of lumbar intervertebral disc 12/27/2013   Lumbar  radiculopathy 12/27/2013   Lumbosacral spondylosis without myelopathy 12/28/2012    PCP: Luciano Cutter DO  REFERRING PROVIDER: Carole Civil, MD  REFERRING DIAG: [phone] (ICD-10-CM) - Unilateral primary osteoarthritis, left knee  THERAPY DIAG:  Left knee pain, unspecified chronicity  Stiffness of left knee, not elsewhere classified  Other abnormalities of gait and mobility  Rationale for Evaluation and Treatment: Rehabilitation  ONSET DATE: 04/23/22  SUBJECTIVE:    SUBJECTIVE STATEMENT: Doing better every week. Sleeping better. Still stiff in the morning. Walking well with cane. No pain right now.   Eval: Patient presents to therapy s/p LT TKA on 04/23/22. Pain is moderate. He is moving slowly. He is walking with RW. He has been having difficulty sleeping at night because it is hard to get comfortable. He is taking pain meds PRN. He is using CPM as instructed. He had home health therapy for 2 weeks.   PERTINENT HISTORY: LT TKA 04/23/22, lumbar surgery, bilat foot surgery   PAIN:  Are you having pain? Yes: NPRS scale: 0/10 Pain location: LT knee Pain description: aching, sore  Aggravating factors: WB, walking  Relieving factors: pain meds, rest, icing   PRECAUTIONS: None  WEIGHT BEARING RESTRICTIONS: No  FALLS:  Has patient fallen in last 6 months? No  LIVING ENVIRONMENT: Lives with: lives with their spouse Lives in: House/apartment Stairs: Yes: External: 1 steps; none Has following equipment at home: Single point cane and Walker - 2 wheeled  OCCUPATION: Retired   PLOF: Independent with basic ADLs  PATIENT GOALS: Get better and get out of it (therapy)  NEXT MD VISIT: 05/07/22  OBJECTIVE:   DIAGNOSTIC FINDINGS: NA  PATIENT SURVEYS:  FOTO 52% function   COGNITION: Overall cognitive status: Within functional limits for tasks assessed     SENSATION: WFL  EDEMA:  Min/ mod   LOWER EXTREMITY ROM:  Active ROM Right eval Left eval  Hip flexion    Hip extension    Hip abduction    Hip adduction    Hip internal rotation    Hip external rotation    Knee flexion 118 72  Knee extension 0 -4  Ankle dorsiflexion    Ankle plantarflexion    Ankle inversion    Ankle eversion     (Blank rows = not tested)  LOWER EXTREMITY MMT:  MMT Right eval Left eval  Hip flexion    Hip extension    Hip abduction    Hip adduction    Hip internal rotation    Hip external rotation    Knee flexion  4  Knee extension  4  Ankle  dorsiflexion  5  Ankle plantarflexion    Ankle inversion    Ankle eversion     (Blank rows = not tested)   FUNCTIONAL TESTS:  2 minute walk test: 200 feet with RW   GAIT:  Decreased stride, decreased LT knee and hip flexion, slightly antalgic, out-toeing bilateral   TODAY'S TREATMENT:  DATE:  05/23/22 Supine: Quad set 10 x 5" (with heel prop for knee extension)  SLR 2 x 10  Heel slides AAROM with strap 15 x 5" Bridge 2 x 10  Slant board 3 x 30" Sit to stand from elevated mat x 10 no UE  Standing hip abduction 2 x10    Heel raise 2 x 10  Manual PROM knee flexion seated at EOB with contract/ relax  Rec bike (seat 16) rocking for ROM    LT knee AROM (0 to 91 deg)  05/20/22 Supine: Quad set 15 x 5" (with heel prop for knee extension)  SLR 2 x 10  Heel slides AAROM with strap 15 x 5" Bridge 2 x 10  Sit to stand from elevated mat x 10 no UE  Heel raise 2 x 10 Tandem stance x30"  Gait training with SPC 1 RT in clinic   Manual PROM knee flexion seated at EOB with contract/ relax  Nustep (seat 9) 5 min EOS for mobility   LT knee AROM (0 to 88 deg)     05/16/22 Supine: Quad set 15 x 5"  SLR 2 x 10  Heel slides AAROM with strap 15 x 5" Bridge 2 x 10  Calf stretch (slant board) 3 x 30"  Heel raise 2 x 10  Sidelying:  Sidelying leg raise 2 x 10   Manual edema massage LT knee; PROM LT knee flexion supine   LT knee AROM (-1 to 88 deg)    05/14/22 Supine: Quad set 15 x 5"  SLR 2 x 10  Glute set 10 x 5"  Heel slides AAROM with strap 15 x 5" Bridge x 10 Calf stretch 3 x 30"   Sidelying: Sidelying leg raise 2 x 10   Manual edema massage LT knee  LT knee AROM (-1 to 85 deg)    PATIENT EDUCATION:  Education details: on eval findings, POC and HEP  Person educated: Patient Education method: Explanation Education comprehension:  verbalized understanding  HOME EXERCISE PROGRAM: Access Code: [email or web address] URL: [email or web address]  05/23/22 - Standing Hip Abduction with Counter Support  - 2-3 x daily - 7 x weekly - 2 sets - 10 reps  05/21/22 - Sit to Stand with Hands on Knees  - 2-3 x daily - 7 x weekly - 1-2 sets - 10 reps - Standing Tandem Balance with Counter Support  - 2-3 x daily - 7 x weekly - 1 sets - 3 reps - 30 second hold  05/16/22 - Heel Raises with Counter Support  - 3 x daily - 7 x weekly - 2 sets - 10 reps  05/14/22 - Sidelying Hip Abduction  - 3 x daily - 7 x weekly - 2 sets - 10 reps - Supine Bridge  - 3 x daily - 7 x weekly - 2 sets - 10 reps  Date: 05/07/2022 Prepared by: Josue Hector  Exercises - Supine Quad Set  - 3 x daily - 7 x weekly - 2 sets - 10 reps - 5 sec hold - Active Straight Leg Raise with Quad Set  - 3 x daily - 7 x weekly - 2 sets - 10 reps - Supine Heel Slide  - 3 x daily - 7 x weekly - 2 sets - 10 reps - 5 sec hold - Supine Ankle Pumps  - 3 x daily - 7 x weekly - 2 sets - 10 reps - Hooklying Gluteal Sets  - 3 x daily - 7 x weekly -  2 sets - 10 reps - 5 sec hold - Supine Knee Extension Mobilization with Weight  - 3 x daily - 7 x weekly - 1 sets - 1 reps - 3-5 minute hold  ASSESSMENT:  CLINICAL IMPRESSION: Steady progress with ROM. Progresses LE strength with added standing hip abduction. Added slant board stretching for ankle mobility. Added rec bike end of session for improved mobility. Patient tolerated session well today. Remains limited by decreased ROM and LLE weakness. Patient will continue to benefit from skilled therapy services to reduce remaining deficits and improve functional ability.    OBJECTIVE IMPAIRMENTS: Abnormal gait, decreased activity tolerance, decreased balance, decreased endurance, decreased mobility, difficulty walking, decreased ROM, decreased strength, hypomobility, increased edema, impaired flexibility, improper body mechanics, and pain.    ACTIVITY LIMITATIONS: carrying, lifting, bending, sitting, standing, squatting, sleeping, stairs, transfers, bed mobility, bathing, and locomotion level  PARTICIPATION LIMITATIONS: meal prep, cleaning, laundry, driving, shopping, community activity, and yard work  PERSONAL FACTORS:  None  are also affecting patient's functional outcome.   REHAB POTENTIAL: Good  CLINICAL DECISION MAKING: Stable/uncomplicated  EVALUATION COMPLEXITY: Low   GOALS: SHORT TERM GOALS: Target date: 05/28/2022  Patient will be independent with initial HEP and self-management strategies to improve functional outcomes Baseline:  Goal status: INITIAL   LONG TERM GOALS: Target date: 06/18/2022  Patient will be independent with advanced HEP and self-management strategies to improve functional outcomes Baseline:  Goal status: INITIAL  2.  Patient will improve FOTO score to predicted value to indicate improvement in functional outcomes Baseline: 52% Goal status: INITIAL  3.  Patient will have LT knee AROM 0-120 degrees to improve functional mobility and facilitate squatting to pick up items from floor. Baseline: -4 to 72 Goal status: INITIAL  4. Patient will have equal to or > 4+/5 MMT throughout LLE to improve ability to perform functional mobility, stair ambulation and ADLs.  Baseline: See MMT Goal status: INITIAL  5. Patient will be able to ambulate at least 325 feet during 2MWT with LRAD to demonstrate improved ability to perform functional mobility and associated tasks. Baseline: 200 feet with RW  Goal status: INITIAL  PLAN:  PT FREQUENCY: 3x/week  PT DURATION: 6 weeks  PLANNED INTERVENTIONS: Therapeutic exercises, Therapeutic activity, Neuromuscular re-education, Balance training, Gait training, Patient/Family education, Joint manipulation, Joint mobilization, Stair training, Aquatic Therapy, Dry Needling, Electrical stimulation, Spinal manipulation, Spinal mobilization, Cryotherapy, Moist  heat, scar mobilization, Taping, Traction, Ultrasound, Biofeedback, Ionotophoresis '4mg'$ /ml Dexamethasone, and Manual therapy.   PLAN FOR NEXT SESSION: Progress knee ROM and quad/ glute strength as tolerated. Add step ups   9:42 AM, 05/23/22 Josue Hector PT DPT  Physical Therapist with [facility]  [phone]

## 2022-05-28 ENCOUNTER — Encounter (HOSPITAL_COMMUNITY): Payer: PPO | Admitting: Physical Therapy

## 2022-05-29 ENCOUNTER — Encounter: Payer: PPO | Admitting: Orthopedic Surgery

## 2022-05-30 ENCOUNTER — Ambulatory Visit (INDEPENDENT_AMBULATORY_CARE_PROVIDER_SITE_OTHER): Payer: PPO | Admitting: Orthopedic Surgery

## 2022-05-30 ENCOUNTER — Encounter: Payer: Self-pay | Admitting: Orthopedic Surgery

## 2022-05-30 ENCOUNTER — Encounter (HOSPITAL_COMMUNITY): Payer: PPO | Admitting: Physical Therapy

## 2022-05-30 DIAGNOSIS — Z96652 Presence of left artificial knee joint: Secondary | ICD-10-CM

## 2022-05-30 NOTE — Progress Notes (Signed)
Postoperative visit  Chief Complaint  Patient presents with   Post-op Follow-up    Left total knee 04/23/22 improving     Encounter Diagnosis  Name Primary?   S/P TKR (total knee replacement), left 04/23/22 Yes    Ethan Oliver is improving he is getting better his range of motion is improving its about 108 degrees  No signs of infection at 6 weeks  End of his wound shows a little red area of redness which is some separation there recommend Neosporin soap and water etc.  Advance activities as tolerated  Finish physical therapy and return in 4 to 6 weeks

## 2022-06-04 ENCOUNTER — Ambulatory Visit (HOSPITAL_COMMUNITY): Payer: PPO | Admitting: Physical Therapy

## 2022-06-04 DIAGNOSIS — M25562 Pain in left knee: Secondary | ICD-10-CM

## 2022-06-04 DIAGNOSIS — M25662 Stiffness of left knee, not elsewhere classified: Secondary | ICD-10-CM

## 2022-06-04 DIAGNOSIS — R2689 Other abnormalities of gait and mobility: Secondary | ICD-10-CM

## 2022-06-04 NOTE — Therapy (Signed)
OUTPATIENT PHYSICAL THERAPY TREATMENT  Patient Name: TABER PAPENFUSS MRN: [id number] DOB:05-05-1955, 67 y.o., male Today's Date: 06/04/2022  END OF SESSION:  PT End of Session - 06/04/22 0944     Visit Number 7    Number of Visits 18    Date for PT Re-Evaluation 06/18/22    Authorization Type [employer]    Progress Note Due on Visit 10    PT Start Time 0945    PT Stop Time 1025    PT Time Calculation (min) 40 min    Activity Tolerance Patient tolerated treatment well    Behavior During Therapy WFL for tasks assessed/performed             Past Medical History:  Diagnosis Date   Arthritis    Chronic back pain    Depression    Diabetes mellitus without complication ([place])    History of gout    History of kidney stones    Hypercholesteremia    Hypertension    Past Surgical History:  Procedure Laterality Date   ACHILLES TENDON SURGERY Left 12/29/2013   Procedure: PERCUTANEOUS TENDO-ACHILLES LENGTHENING;  Surgeon: Marcheta Grammes, DPM;  Location: [facility];  Service: Podiatry;  Laterality: Left;   CALCANEAL OSTEOTOMY WITH ILIAC CREST BONE GRAFT AND REPAIR [place] TENDON AND ACHILLES TENDON Left 12/29/2013   Procedure: CALCANEOCUBOID JOINT FUSION PERFORMED WITH BONE GRAFT;  Surgeon: Marcheta Grammes, DPM;  Location: [facility];  Service: Podiatry;  Laterality: Left;   CARPAL TUNNEL RELEASE Bilateral    COLONOSCOPY WITH PROPOFOL N/A 05/02/2021   Procedure: COLONOSCOPY WITH PROPOFOL;  Surgeon: Rogene Houston, MD;  Location: [facility];  Service: Endoscopy;  Laterality: N/A;  930   Disk fusion     lumbar   FLEXOR TENOTOMY  Right 02/18/2018   Procedure: PERCUTANEOUS FLEXOR TENOTOMY/APPLICATION OF BK POSTERIOR SPLINT RLE;  Surgeon: Caprice Beaver, DPM;  Location: [facility];  Service: Podiatry;  Laterality: Right;   FOOT ARTHRODESIS Left 12/29/2013   Procedure: TRIPLE ARTHRODESIS FOOT;  Surgeon: Marcheta Grammes, DPM;  Location: [facility];  Service:  Podiatry;  Laterality: Left;   FOOT ARTHRODESIS Right 02/18/2018   Procedure: TRIPLE ARTHRODESIS RIGHT FOOT;  Surgeon: Caprice Beaver, DPM;  Location: [facility];  Service: Podiatry;  Laterality: Right;   KNEE SURGERY Right    Surgery on this knee twice.    POLYPECTOMY  05/02/2021   Procedure: POLYPECTOMY INTESTINAL;  Surgeon: Rogene Houston, MD;  Location: [facility];  Service: Endoscopy;;   TOTAL KNEE ARTHROPLASTY Left 04/23/2022   Procedure: TOTAL KNEE ARTHROPLASTY;  Surgeon: Carole Civil, MD;  Location: [facility];  Service: Orthopedics;  Laterality: Left;   Patient Active Problem List   Diagnosis Date Noted   S/P TKR (total knee replacement), left 04/23/22 05/07/2022   Primary osteoarthritis of left knee 04/23/2022   Mood disorder ([place]) 09/12/2021   Postlaminectomy syndrome of lumbar region 09/12/2021   Arthritis of left elbow 06/21/2019   Cubital tunnel syndrome on left 06/21/2019   Inflammation of left elbow 06/21/2019   Arthritis, lumbar spine 08/05/2018   Benign essential hypertension 11/07/2016   Generalized osteoarthritis of multiple sites 11/07/2016   Type 2 diabetes mellitus without complication, without long-term current use of insulin ([place]) 11/07/2016   Diastasis recti 11/07/2016   Pure hypercholesterolemia 11/07/2016   Morbid obesity with BMI of 40.0-44.9, adult ([place]) 12/27/2013   Back pain 12/27/2013   Degeneration of lumbar intervertebral disc 12/27/2013   Lumbar radiculopathy 12/27/2013  Lumbosacral spondylosis without myelopathy 12/28/2012    PCP: Luciano Cutter DO  REFERRING PROVIDER: Carole Civil, MD  REFERRING DIAG: [id number] (ICD-10-CM) - Unilateral primary osteoarthritis, left knee  THERAPY DIAG:  Left knee pain, unspecified chronicity  Stiffness of left knee, not elsewhere classified  Other abnormalities of gait and mobility  Rationale for Evaluation and Treatment: Rehabilitation  ONSET DATE: 04/23/22  SUBJECTIVE:   SUBJECTIVE  STATEMENT: Pt states it is stiff this morning.  Continues to walk with SPC. No pain right now.   Eval: Patient presents to therapy s/p LT TKA on 04/23/22. Pain is moderate. He is moving slowly. He is walking with RW. He has been having difficulty sleeping at night because it is hard to get comfortable. He is taking pain meds PRN. He is using CPM as instructed. He had home health therapy for 2 weeks.   PERTINENT HISTORY: LT TKA 04/23/22, lumbar surgery, bilat foot surgery   PAIN:  Are you having pain? Yes: NPRS scale: 0/10 Pain location: LT knee Pain description: aching, sore  Aggravating factors: WB, walking  Relieving factors: pain meds, rest, icing   PRECAUTIONS: None  WEIGHT BEARING RESTRICTIONS: No  FALLS:  Has patient fallen in last 6 months? No  LIVING ENVIRONMENT: Lives with: lives with their spouse Lives in: House/apartment Stairs: Yes: External: 1 steps; none Has following equipment at home: Single point cane and Walker - 2 wheeled  OCCUPATION: Retired   PLOF: Independent with basic ADLs  PATIENT GOALS: Get better and get out of it (therapy)  NEXT MD VISIT: 05/07/22  OBJECTIVE:   DIAGNOSTIC FINDINGS: NA  PATIENT SURVEYS:  FOTO 52% function   COGNITION: Overall cognitive status: Within functional limits for tasks assessed     SENSATION: WFL  EDEMA:  Min/ mod   LOWER EXTREMITY ROM:  Active ROM Right eval Left eval Left 06/04/22  Hip flexion     Hip extension     Hip abduction     Hip adduction     Hip internal rotation     Hip external rotation     Knee flexion 118 72 103  Knee extension 0 -4 0  Ankle dorsiflexion     Ankle plantarflexion     Ankle inversion     Ankle eversion      (Blank rows = not tested)  LOWER EXTREMITY MMT:  MMT Right eval Left eval  Hip flexion    Hip extension    Hip abduction    Hip adduction    Hip internal rotation    Hip external rotation    Knee flexion  4  Knee extension  4  Ankle dorsiflexion  5   Ankle plantarflexion    Ankle inversion    Ankle eversion     (Blank rows = not tested)   FUNCTIONAL TESTS:  2 minute walk test: 200 feet with RW   GAIT:  Decreased stride, decreased LT knee and hip flexion, slightly antalgic, out-toeing bilateral   TODAY'S TREATMENT:  DATE:  06/04/22 Recumbent bike seat 16 rocking 5 minutes Standing:  12" box knee flexion stretch 10X10"  Lt knee flexion 10X  Heelraises 15X  Slantboard 3X30"  4" lateral step ups 10X Lt LE  4" forward step up 10X Lt LE Supine:  manual scar massage Lt knee  Lt SLR 15X  Bridge 15X AROM 0-103  05/23/22 Supine: Quad set 10 x 5" (with heel prop for knee extension)  SLR 2 x 10  Heel slides AAROM with strap 15 x 5" Bridge 2 x 10  Slant board 3 x 30" Sit to stand from elevated mat x 10 no UE  Standing hip abduction 2 x10    Heel raise 2 x 10  Manual PROM knee flexion seated at EOB with contract/ relax  Rec bike (seat 16) rocking for ROM    LT knee AROM (0 to 91 deg)  05/20/22 Supine: Quad set 15 x 5" (with heel prop for knee extension)  SLR 2 x 10  Heel slides AAROM with strap 15 x 5" Bridge 2 x 10  Sit to stand from elevated mat x 10 no UE  Heel raise 2 x 10 Tandem stance x30"  Gait training with SPC 1 RT in clinic   Manual PROM knee flexion seated at EOB with contract/ relax  Nustep (seat 9) 5 min EOS for mobility   LT knee AROM (0 to 88 deg)     05/16/22 Supine: Quad set 15 x 5"  SLR 2 x 10  Heel slides AAROM with strap 15 x 5" Bridge 2 x 10  Calf stretch (slant board) 3 x 30"  Heel raise 2 x 10  Sidelying:  Sidelying leg raise 2 x 10   Manual edema massage LT knee; PROM LT knee flexion supine   LT knee AROM (-1 to 88 deg)    05/14/22 Supine: Quad set 15 x 5"  SLR 2 x 10  Glute set 10 x 5"  Heel slides AAROM with strap 15 x 5" Bridge x 10 Calf stretch  3 x 30"   Sidelying: Sidelying leg raise 2 x 10   Manual edema massage LT knee  LT knee AROM (-1 to 85 deg)    PATIENT EDUCATION:  Education details: on eval findings, POC and HEP  Person educated: Patient Education method: Explanation Education comprehension: verbalized understanding  HOME EXERCISE PROGRAM: Access Code: [email or web address] URL: [email or web address]  05/23/22 - Standing Hip Abduction with Counter Support  - 2-3 x daily - 7 x weekly - 2 sets - 10 reps  05/21/22 - Sit to Stand with Hands on Knees  - 2-3 x daily - 7 x weekly - 1-2 sets - 10 reps - Standing Tandem Balance with Counter Support  - 2-3 x daily - 7 x weekly - 1 sets - 3 reps - 30 second hold  05/16/22 - Heel Raises with Counter Support  - 3 x daily - 7 x weekly - 2 sets - 10 reps  05/14/22 - Sidelying Hip Abduction  - 3 x daily - 7 x weekly - 2 sets - 10 reps - Supine Bridge  - 3 x daily - 7 x weekly - 2 sets - 10 reps  Date: 05/07/2022 Prepared by: Josue Hector  Exercises - Supine Quad Set  - 3 x daily - 7 x weekly - 2 sets - 10 reps - 5 sec hold - Active Straight Leg Raise with Quad Set  - 3 x daily - 7 x weekly -  2 sets - 10 reps - Supine Heel Slide  - 3 x daily - 7 x weekly - 2 sets - 10 reps - 5 sec hold - Supine Ankle Pumps  - 3 x daily - 7 x weekly - 2 sets - 10 reps - Hooklying Gluteal Sets  - 3 x daily - 7 x weekly - 2 sets - 10 reps - 5 sec hold - Supine Knee Extension Mobilization with Weight  - 3 x daily - 7 x weekly - 1 sets - 1 reps - 3-5 minute hold  ASSESSMENT:  CLINICAL IMPRESSION: Pt still unable to make full revolutions with bike.  Added standing flexion stretch and step up activities per last visit plan.  Pt able to complete all these without c/o pain or issues.  Manual continued to tight perimeter loosening scar tissue with good results.  Improved AROM to 103 degrees of flexion.  Pt is walking short distances without AD at this time as well.   Patient tolerated session  well today. Remains limited by decreased ROM and LLE weakness. Patient will continue to benefit from skilled therapy services to reduce remaining deficits and improve functional ability.    OBJECTIVE IMPAIRMENTS: Abnormal gait, decreased activity tolerance, decreased balance, decreased endurance, decreased mobility, difficulty walking, decreased ROM, decreased strength, hypomobility, increased edema, impaired flexibility, improper body mechanics, and pain.   ACTIVITY LIMITATIONS: carrying, lifting, bending, sitting, standing, squatting, sleeping, stairs, transfers, bed mobility, bathing, and locomotion level  PARTICIPATION LIMITATIONS: meal prep, cleaning, laundry, driving, shopping, community activity, and yard work  PERSONAL FACTORS:  None  are also affecting patient's functional outcome.   REHAB POTENTIAL: Good  CLINICAL DECISION MAKING: Stable/uncomplicated  EVALUATION COMPLEXITY: Low   GOALS: SHORT TERM GOALS: Target date: 05/28/2022  Patient will be independent with initial HEP and self-management strategies to improve functional outcomes Baseline:  Goal status: IN PROGRESS   LONG TERM GOALS: Target date: 06/18/2022  Patient will be independent with advanced HEP and self-management strategies to improve functional outcomes Baseline:  Goal status: IN PROGRESS  2.  Patient will improve FOTO score to predicted value to indicate improvement in functional outcomes Baseline: 52% Goal status: IN PROGRESS  3.  Patient will have LT knee AROM 0-120 degrees to improve functional mobility and facilitate squatting to pick up items from floor. Baseline: -4 to 72 Goal status: IN PROGRESS  4. Patient will have equal to or > 4+/5 MMT throughout LLE to improve ability to perform functional mobility, stair ambulation and ADLs.  Baseline: See MMT Goal status: IN PROGRESS  5. Patient will be able to ambulate at least 325 feet during 2MWT with LRAD to demonstrate improved ability to perform  functional mobility and associated tasks. Baseline: 200 feet with RW  Goal status: IN PROGRESS  PLAN:  PT FREQUENCY: 3x/week  PT DURATION: 6 weeks  PLANNED INTERVENTIONS: Therapeutic exercises, Therapeutic activity, Neuromuscular re-education, Balance training, Gait training, Patient/Family education, Joint manipulation, Joint mobilization, Stair training, Aquatic Therapy, Dry Needling, Electrical stimulation, Spinal manipulation, Spinal mobilization, Cryotherapy, Moist heat, scar mobilization, Taping, Traction, Ultrasound, Biofeedback, Ionotophoresis 4mg /ml Dexamethasone, and Manual therapy.   PLAN FOR NEXT SESSION: Progress knee ROM and quad/ glute strength as tolerated.   12:08 PM, 06/04/22 Teena Irani, PTA/CLT [place] Ph: [phone]

## 2022-06-06 ENCOUNTER — Ambulatory Visit (HOSPITAL_COMMUNITY): Payer: PPO | Admitting: Physical Therapy

## 2022-06-06 DIAGNOSIS — M25662 Stiffness of left knee, not elsewhere classified: Secondary | ICD-10-CM

## 2022-06-06 DIAGNOSIS — M25562 Pain in left knee: Secondary | ICD-10-CM | POA: Diagnosis not present

## 2022-06-06 DIAGNOSIS — R2689 Other abnormalities of gait and mobility: Secondary | ICD-10-CM

## 2022-06-06 NOTE — Therapy (Signed)
OUTPATIENT PHYSICAL THERAPY TREATMENT  Patient Name: Ethan Oliver MRN: [id number] DOB:24-Feb-1956, 67 y.o., male Today's Date: 06/06/2022  END OF SESSION:  PT End of Session - 06/06/22 1035     Visit Number 8    Number of Visits 18    Date for PT Re-Evaluation 06/18/22    Authorization Type [employer]    Progress Note Due on Visit 10    PT Start Time 1033    PT Stop Time 1112    PT Time Calculation (min) 39 min    Activity Tolerance Patient tolerated treatment well    Behavior During Therapy WFL for tasks assessed/performed             Past Medical History:  Diagnosis Date   Arthritis    Chronic back pain    Depression    Diabetes mellitus without complication ([place])    History of gout    History of kidney stones    Hypercholesteremia    Hypertension    Past Surgical History:  Procedure Laterality Date   ACHILLES TENDON SURGERY Left 12/29/2013   Procedure: PERCUTANEOUS TENDO-ACHILLES LENGTHENING;  Surgeon: Marcheta Grammes, DPM;  Location: [facility];  Service: Podiatry;  Laterality: Left;   CALCANEAL OSTEOTOMY WITH ILIAC CREST BONE GRAFT AND REPAIR [place] TENDON AND ACHILLES TENDON Left 12/29/2013   Procedure: CALCANEOCUBOID JOINT FUSION PERFORMED WITH BONE GRAFT;  Surgeon: Marcheta Grammes, DPM;  Location: [facility];  Service: Podiatry;  Laterality: Left;   CARPAL TUNNEL RELEASE Bilateral    COLONOSCOPY WITH PROPOFOL N/A 05/02/2021   Procedure: COLONOSCOPY WITH PROPOFOL;  Surgeon: Rogene Houston, MD;  [facility];  Service: Endoscopy;  Laterality: N/A;  930   Disk fusion     lumbar   FLEXOR TENOTOMY  Right 02/18/2018   Procedure: PERCUTANEOUS FLEXOR TENOTOMY/APPLICATION OF BK POSTERIOR SPLINT RLE;  Surgeon: Caprice Beaver, DPM;  Location: [facility];  Service: Podiatry;  Laterality: Right;   FOOT ARTHRODESIS Left 12/29/2013   Procedure: TRIPLE ARTHRODESIS FOOT;  Surgeon: Marcheta Grammes, DPM;  Location: [facility];  Service:  Podiatry;  Laterality: Left;   FOOT ARTHRODESIS Right 02/18/2018   Procedure: TRIPLE ARTHRODESIS RIGHT FOOT;  Surgeon: Caprice Beaver, DPM;  Location: [facility];  Service: Podiatry;  Laterality: Right;   KNEE SURGERY Right    Surgery on this knee twice.    POLYPECTOMY  05/02/2021   Procedure: POLYPECTOMY INTESTINAL;  Surgeon: Rogene Houston, MD;  [facility];  Service: Endoscopy;;   TOTAL KNEE ARTHROPLASTY Left 04/23/2022   Procedure: TOTAL KNEE ARTHROPLASTY;  Surgeon: Carole Civil, MD;  Location: [facility];  Service: Orthopedics;  Laterality: Left;   Patient Active Problem List   Diagnosis Date Noted   S/P TKR (total knee replacement), left 04/23/22 05/07/2022   Primary osteoarthritis of left knee 04/23/2022   Mood disorder ([place]) 09/12/2021   Postlaminectomy syndrome of lumbar region 09/12/2021   Arthritis of left elbow 06/21/2019   Cubital tunnel syndrome on left 06/21/2019   Inflammation of left elbow 06/21/2019   Arthritis, lumbar spine 08/05/2018   Benign essential hypertension 11/07/2016   Generalized osteoarthritis of multiple sites 11/07/2016   Type 2 diabetes mellitus without complication, without long-term current use of insulin ([place]) 11/07/2016   Diastasis recti 11/07/2016   Pure hypercholesterolemia 11/07/2016   Morbid obesity with BMI of 40.0-44.9, adult ([place]) 12/27/2013   Back pain 12/27/2013   Degeneration of lumbar intervertebral disc 12/27/2013   Lumbar radiculopathy 12/27/2013  Lumbosacral spondylosis without myelopathy 12/28/2012    PCP: Luciano Cutter DO  REFERRING PROVIDER: Carole Civil, MD  REFERRING DIAG: [phone] (ICD-10-CM) - Unilateral primary osteoarthritis, left knee  THERAPY DIAG:  Left knee pain, unspecified chronicity  Stiffness of left knee, not elsewhere classified  Other abnormalities of gait and mobility  Rationale for Evaluation and Treatment: Rehabilitation  ONSET DATE: 04/23/22  SUBJECTIVE:   SUBJECTIVE  STATEMENT: Doing well overall. Not much pain right now. Still stiff in mornings. Has been walking and exercising a lot lately.   Eval: Patient presents to therapy s/p LT TKA on 04/23/22. Pain is moderate. He is moving slowly. He is walking with RW. He has been having difficulty sleeping at night because it is hard to get comfortable. He is taking pain meds PRN. He is using CPM as instructed. He had home health therapy for 2 weeks.   PERTINENT HISTORY: LT TKA 04/23/22, lumbar surgery, bilat foot surgery   PAIN:  Are you having pain? Yes: NPRS scale: 0/10 Pain location: LT knee Pain description: aching, sore  Aggravating factors: WB, walking  Relieving factors: pain meds, rest, icing   PRECAUTIONS: None  WEIGHT BEARING RESTRICTIONS: No  FALLS:  Has patient fallen in last 6 months? No  LIVING ENVIRONMENT: Lives with: lives with their spouse Lives in: House/apartment Stairs: Yes: External: 1 steps; none Has following equipment at home: Single point cane and Walker - 2 wheeled  OCCUPATION: Retired   PLOF: Independent with basic ADLs  PATIENT GOALS: Get better and get out of it (therapy)  NEXT MD VISIT: 05/07/22  OBJECTIVE:   DIAGNOSTIC FINDINGS: NA  PATIENT SURVEYS:  FOTO 52% function   COGNITION: Overall cognitive status: Within functional limits for tasks assessed     SENSATION: WFL  EDEMA:  Min/ mod   LOWER EXTREMITY ROM:  Active ROM Right eval Left eval Left 06/04/22  Hip flexion     Hip extension     Hip abduction     Hip adduction     Hip internal rotation     Hip external rotation     Knee flexion 118 72 103  Knee extension 0 -4 0  Ankle dorsiflexion     Ankle plantarflexion     Ankle inversion     Ankle eversion      (Blank rows = not tested)  LOWER EXTREMITY MMT:  MMT Right eval Left eval  Hip flexion    Hip extension    Hip abduction    Hip adduction    Hip internal rotation    Hip external rotation    Knee flexion  4  Knee  extension  4  Ankle dorsiflexion  5  Ankle plantarflexion    Ankle inversion    Ankle eversion     (Blank rows = not tested)   FUNCTIONAL TESTS:  2 minute walk test: 200 feet with RW   GAIT:  Decreased stride, decreased LT knee and hip flexion, slightly antalgic, out-toeing bilateral   TODAY'S TREATMENT:  DATE:  06/06/22 Bridge 10 x 5" Quad set with heel prop 10 x 5"  Heel slide with strap 10 x 5"     manual scar massage Lt knee  Knee drive on 2nd step 10 x 10" Tandem stance 2 x 30"  7 inch step up x 15 HHA x 2  4 inch step down x15 HHA x 2 Sit to stand x 10   Rec bike (seat 17) 5 min EOS rocking for improved AROM   LT knee AROM 0-102 deg  06/04/22 Recumbent bike seat 16 rocking 5 minutes Standing:  12" box knee flexion stretch 10X10"  Lt knee flexion 10X  Heelraises 15X  Slantboard 3X30"  4" lateral step ups 10X Lt LE  4" forward step up 10X Lt LE Supine:  manual scar massage Lt knee  Lt SLR 15X  Bridge 15X AROM 0-103  05/23/22 Supine: Quad set 10 x 5" (with heel prop for knee extension)  SLR 2 x 10  Heel slides AAROM with strap 15 x 5" Bridge 2 x 10  Slant board 3 x 30" Sit to stand from elevated mat x 10 no UE  Standing hip abduction 2 x10    Heel raise 2 x 10  Manual PROM knee flexion seated at EOB with contract/ relax  Rec bike (seat 16) rocking for ROM    LT knee AROM (0 to 91 deg)  05/20/22 Supine: Quad set 15 x 5" (with heel prop for knee extension)  SLR 2 x 10  Heel slides AAROM with strap 15 x 5" Bridge 2 x 10  Sit to stand from elevated mat x 10 no UE  Heel raise 2 x 10 Tandem stance x30"  Gait training with SPC 1 RT in clinic   Manual PROM knee flexion seated at EOB with contract/ relax  Nustep (seat 9) 5 min EOS for mobility   LT knee AROM (0 to 88 deg)     05/16/22 Supine: Quad set 15 x 5"  SLR 2 x 10   Heel slides AAROM with strap 15 x 5" Bridge 2 x 10  Calf stretch (slant board) 3 x 30"  Heel raise 2 x 10  Sidelying:  Sidelying leg raise 2 x 10   Manual edema massage LT knee; PROM LT knee flexion supine   LT knee AROM (-1 to 88 deg)    05/14/22 Supine: Quad set 15 x 5"  SLR 2 x 10  Glute set 10 x 5"  Heel slides AAROM with strap 15 x 5" Bridge x 10 Calf stretch 3 x 30"   Sidelying: Sidelying leg raise 2 x 10   Manual edema massage LT knee  LT knee AROM (-1 to 85 deg)    PATIENT EDUCATION:  Education details: on eval findings, POC and HEP  Person educated: Patient Education method: Explanation Education comprehension: verbalized understanding  HOME EXERCISE PROGRAM: Access Code: [email or web address] URL: [email or web address]  05/23/22 - Standing Hip Abduction with Counter Support  - 2-3 x daily - 7 x weekly - 2 sets - 10 reps  05/21/22 - Sit to Stand with Hands on Knees  - 2-3 x daily - 7 x weekly - 1-2 sets - 10 reps - Standing Tandem Balance with Counter Support  - 2-3 x daily - 7 x weekly - 1 sets - 3 reps - 30 second hold  05/16/22 - Heel Raises with Counter Support  - 3 x daily - 7 x weekly - 2 sets - 10 reps  05/14/22 - Sidelying Hip Abduction  - 3 x daily - 7 x weekly - 2 sets - 10 reps - Supine Bridge  - 3 x daily - 7 x weekly - 2 sets - 10 reps  Date: 05/07/2022 Prepared by: Josue Hector  Exercises - Supine Quad Set  - 3 x daily - 7 x weekly - 2 sets - 10 reps - 5 sec hold - Active Straight Leg Raise with Quad Set  - 3 x daily - 7 x weekly - 2 sets - 10 reps - Supine Heel Slide  - 3 x daily - 7 x weekly - 2 sets - 10 reps - 5 sec hold - Supine Ankle Pumps  - 3 x daily - 7 x weekly - 2 sets - 10 reps - Hooklying Gluteal Sets  - 3 x daily - 7 x weekly - 2 sets - 10 reps - 5 sec hold - Supine Knee Extension Mobilization with Weight  - 3 x daily - 7 x weekly - 1 sets - 1 reps - 3-5 minute hold  ASSESSMENT:  CLINICAL IMPRESSION: Patient  tolerated session well overall. He remains limited in knee flexion AROM. He is showing improved balance and is now able to walk short distance safely with no AD. Introduced forward step ups and step downs for improved functional LE strength. Patient well challenged with this but does improve with practice. Patient noting appropriate level of fatigue at end of today's session. Patient will continue to benefit from skilled therapy services to reduce remaining deficits and improve functional ability.    OBJECTIVE IMPAIRMENTS: Abnormal gait, decreased activity tolerance, decreased balance, decreased endurance, decreased mobility, difficulty walking, decreased ROM, decreased strength, hypomobility, increased edema, impaired flexibility, improper body mechanics, and pain.   ACTIVITY LIMITATIONS: carrying, lifting, bending, sitting, standing, squatting, sleeping, stairs, transfers, bed mobility, bathing, and locomotion level  PARTICIPATION LIMITATIONS: meal prep, cleaning, laundry, driving, shopping, community activity, and yard work  PERSONAL FACTORS:  None  are also affecting patient's functional outcome.   REHAB POTENTIAL: Good  CLINICAL DECISION MAKING: Stable/uncomplicated  EVALUATION COMPLEXITY: Low   GOALS: SHORT TERM GOALS: Target date: 05/28/2022  Patient will be independent with initial HEP and self-management strategies to improve functional outcomes Baseline:  Goal status: IN PROGRESS   LONG TERM GOALS: Target date: 06/18/2022  Patient will be independent with advanced HEP and self-management strategies to improve functional outcomes Baseline:  Goal status: IN PROGRESS  2.  Patient will improve FOTO score to predicted value to indicate improvement in functional outcomes Baseline: 52% Goal status: IN PROGRESS  3.  Patient will have LT knee AROM 0-120 degrees to improve functional mobility and facilitate squatting to pick up items from floor. Baseline: -4 to 72 Goal status: IN  PROGRESS  4. Patient will have equal to or > 4+/5 MMT throughout LLE to improve ability to perform functional mobility, stair ambulation and ADLs.  Baseline: See MMT Goal status: IN PROGRESS  5. Patient will be able to ambulate at least 325 feet during 2MWT with LRAD to demonstrate improved ability to perform functional mobility and associated tasks. Baseline: 200 feet with RW  Goal status: IN PROGRESS  PLAN:  PT FREQUENCY: 3x/week  PT DURATION: 6 weeks  PLANNED INTERVENTIONS: Therapeutic exercises, Therapeutic activity, Neuromuscular re-education, Balance training, Gait training, Patient/Family education, Joint manipulation, Joint mobilization, Stair training, Aquatic Therapy, Dry Needling, Electrical stimulation, Spinal manipulation, Spinal mobilization, Cryotherapy, Moist heat, scar mobilization, Taping, Traction, Ultrasound, Biofeedback, Ionotophoresis 4mg /ml Dexamethasone, and Manual  therapy.   PLAN FOR NEXT SESSION: Progress knee ROM and quad/ glute strength as tolerated.   11:13 AM, 06/06/22 Josue Hector PT DPT  Physical Therapist with [facility]  [phone]

## 2022-06-11 ENCOUNTER — Encounter (HOSPITAL_COMMUNITY): Payer: PPO | Admitting: Physical Therapy

## 2022-06-13 ENCOUNTER — Encounter (HOSPITAL_COMMUNITY): Payer: PPO | Admitting: Physical Therapy

## 2022-06-18 ENCOUNTER — Encounter (HOSPITAL_COMMUNITY): Payer: PPO | Admitting: Physical Therapy

## 2022-07-04 ENCOUNTER — Ambulatory Visit (INDEPENDENT_AMBULATORY_CARE_PROVIDER_SITE_OTHER): Payer: PPO | Admitting: Orthopedic Surgery

## 2022-07-04 ENCOUNTER — Encounter: Payer: Self-pay | Admitting: Orthopedic Surgery

## 2022-07-04 DIAGNOSIS — Z96652 Presence of left artificial knee joint: Secondary | ICD-10-CM

## 2022-07-04 DIAGNOSIS — M1712 Unilateral primary osteoarthritis, left knee: Secondary | ICD-10-CM

## 2022-07-04 NOTE — Progress Notes (Signed)
   This is a postop visit  Chief Complaint  Patient presents with   Routine Post Op    S/p TKA LT DOS 04/23/22     Encounter Diagnoses  Name Primary?   S/P TKR (total knee replacement), left 04/23/22 Yes   Unilateral primary osteoarthritis, left knee     The patient is status post LEFT TKA   This is postop day number 72   Postop pain control  no meds   Subjective complaints  none   Physical exam findings  0-125   Assessment and plan  Doing well, wants to have rt tka in October after the beach trip

## 2022-07-22 ENCOUNTER — Ambulatory Visit (INDEPENDENT_AMBULATORY_CARE_PROVIDER_SITE_OTHER): Payer: PPO | Admitting: Orthopedic Surgery

## 2022-07-22 DIAGNOSIS — G8929 Other chronic pain: Secondary | ICD-10-CM

## 2022-07-22 DIAGNOSIS — M25561 Pain in right knee: Secondary | ICD-10-CM

## 2022-07-22 DIAGNOSIS — M1711 Unilateral primary osteoarthritis, right knee: Secondary | ICD-10-CM

## 2022-07-22 MED ORDER — METHYLPREDNISOLONE ACETATE 40 MG/ML IJ SUSP
40.0000 mg | Freq: Once | INTRAMUSCULAR | Status: AC
  Administered 2022-07-22: 40 mg via INTRA_ARTICULAR

## 2022-07-22 NOTE — Progress Notes (Signed)
   The patient has requested an injection   Chief Complaint  Patient presents with   Knee Pain    Right wants injection      Encounter Diagnoses  Name Primary?   Primary osteoarthritis of right knee Yes   Chronic pain of right knee         After appropriate timeout for site confirmation medication confirmation,  The RIGHT  was prepped with alcohol and ethyl chloride spray.  The injection was performed at the RIGHT KNEE ANTEROLAT JOINT   Medication Depomedrol 40 mg and 1% lidocaine plain   There were no complications  The patient was observed for any reactions there were none and the patient was discharged.

## 2022-07-22 NOTE — Addendum Note (Signed)
Addended byCaffie Damme on: 07/22/2022 11:43 AM   Modules accepted: Orders

## 2022-09-30 ENCOUNTER — Institutional Professional Consult (permissible substitution): Payer: PPO | Admitting: Neurology

## 2022-12-12 ENCOUNTER — Ambulatory Visit: Payer: PPO | Admitting: Orthopedic Surgery

## 2022-12-12 ENCOUNTER — Other Ambulatory Visit (INDEPENDENT_AMBULATORY_CARE_PROVIDER_SITE_OTHER): Payer: Self-pay

## 2022-12-12 ENCOUNTER — Encounter: Payer: Self-pay | Admitting: Orthopedic Surgery

## 2022-12-12 VITALS — BP 137/91 | HR 84 | Ht 71.0 in | Wt 304.0 lb

## 2022-12-12 DIAGNOSIS — M1711 Unilateral primary osteoarthritis, right knee: Secondary | ICD-10-CM

## 2022-12-12 DIAGNOSIS — Z96652 Presence of left artificial knee joint: Secondary | ICD-10-CM

## 2022-12-12 DIAGNOSIS — Z01818 Encounter for other preprocedural examination: Secondary | ICD-10-CM

## 2022-12-12 DIAGNOSIS — Z96651 Presence of right artificial knee joint: Secondary | ICD-10-CM

## 2022-12-12 DIAGNOSIS — Z6841 Body Mass Index (BMI) 40.0 and over, adult: Secondary | ICD-10-CM

## 2022-12-12 MED ORDER — BUPIVACAINE-MELOXICAM ER 400-12 MG/14ML IJ SOLN: 400.0000 mg | Freq: Once | INTRAMUSCULAR | Status: DC

## 2022-12-12 NOTE — Progress Notes (Signed)
Preop evaluation for right total knee   Encounter Diagnoses  Name Primary?   Status post total right knee replacement    Primary osteoarthritis of right knee Yes   Body mass index 40.0-44.9, adult ([facility])    Morbid obesity ([facility])      Chief complaint right knee pain  History of present illness this is a 67 year old male with diabetes who presents for right total knee arthroplasty  He complains of longstanding disabling right knee pain on the medial side of his knee which interferes with his activities of daily living.  He is on Ozempic he has undergone nonsurgical management including Weight loss Activity modification Judicious use of anti-inflammatories Injection  He did not improve.  He underwent successful left total knee and complains of some lateral incision numbness and occasional pain in the front of the knee just superior to the patella  Review of systems currently has no chest pain shortness of breath fever chills nausea easy bruising or bleeding  BP (!) 137/91   Pulse 84   Ht 5\' 11"  (1.803 m)   Wt (!) 304 lb (137.9 kg)   BMI 42.40 kg/m    Physical Exam Vitals and nursing note reviewed.  Constitutional:      Appearance: Normal appearance.  HENT:     Head: Normocephalic and atraumatic.  Eyes:     General: No scleral icterus.       Right eye: No discharge.        Left eye: No discharge.     Extraocular Movements: Extraocular movements intact.     Conjunctiva/sclera: Conjunctivae normal.     Pupils: Pupils are equal, round, and reactive to light.  Cardiovascular:     Rate and Rhythm: Normal rate.     Pulses: Normal pulses.     Comments: Mild rubor both lower extremities from chronic edema Musculoskeletal:     Right lower leg: Edema present.     Left lower leg: Edema present.     Comments: His upper extremities are normal  His left total knee has a midline incision which healed well.  His knee flexion is approximate 120 degrees on the left with a stable  knee  Right knee  Right knee is in mild varus but comes to full extension small joint effusion noted most of the tenderness is on the medial joint line there is crepitance on range of motion he has up to 120 degrees of flexion on the right  No instability detected    Skin:    General: Skin is warm and dry.     Capillary Refill: Capillary refill takes less than 2 seconds.  Neurological:     General: No focal deficit present.     Mental Status: He is alert and oriented to person, place, and time.  Psychiatric:        Mood and Affect: Mood normal.        Behavior: Behavior normal.        Thought Content: Thought content normal.        Judgment: Judgment normal.     Past Medical History:  Diagnosis Date   Arthritis    Chronic back pain    Depression    Diabetes mellitus without complication ([facility])    History of gout    History of kidney stones    Hypercholesteremia    Hypertension    Past Surgical History:  Procedure Laterality Date   ACHILLES TENDON SURGERY Left 12/29/2013   Procedure: PERCUTANEOUS TENDO-ACHILLES LENGTHENING;  Surgeon: Dallas Schimke, DPM;  Location: [facility];  Service: Podiatry;  Laterality: Left;   CALCANEAL OSTEOTOMY WITH ILIAC CREST BONE GRAFT AND REPAIR [place] TENDON AND ACHILLES TENDON Left 12/29/2013   Procedure: CALCANEOCUBOID JOINT FUSION PERFORMED WITH BONE GRAFT;  Surgeon: Dallas Schimke, DPM;  Location: [facility];  Service: Podiatry;  Laterality: Left;   CARPAL TUNNEL RELEASE Bilateral    COLONOSCOPY WITH PROPOFOL N/A 05/02/2021   Procedure: COLONOSCOPY WITH PROPOFOL;  Surgeon: Malissa Hippo, MD;  [facility];  Service: Endoscopy;  Laterality: N/A;  930   Disk fusion     lumbar   FLEXOR TENOTOMY  Right 02/18/2018   Procedure: PERCUTANEOUS FLEXOR TENOTOMY/APPLICATION OF BK POSTERIOR SPLINT RLE;  Surgeon: Ferman Hamming, DPM;  Location: [facility];  Service: Podiatry;  Laterality: Right;   FOOT ARTHRODESIS Left  12/29/2013   Procedure: TRIPLE ARTHRODESIS FOOT;  Surgeon: Dallas Schimke, DPM;  Location: [facility];  Service: Podiatry;  Laterality: Left;   FOOT ARTHRODESIS Right 02/18/2018   Procedure: TRIPLE ARTHRODESIS RIGHT FOOT;  Surgeon: Ferman Hamming, DPM;  Location: [facility];  Service: Podiatry;  Laterality: Right;   KNEE SURGERY Right    Surgery on this knee twice.    POLYPECTOMY  05/02/2021   Procedure: POLYPECTOMY INTESTINAL;  Surgeon: Malissa Hippo, MD;  [facility];  Service: Endoscopy;;   TOTAL KNEE ARTHROPLASTY Left 04/23/2022   Procedure: TOTAL KNEE ARTHROPLASTY;  Surgeon: Vickki Hearing, MD;  Location: [facility];  Service: Orthopedics;  Laterality: Left;   Family History  Problem Relation Age of Onset   Healthy Mother    Healthy Father    Social History   Tobacco Use   Smoking status: Never   Smokeless tobacco: Former    Types: Chew    Quit date: 09/21/2013  Vaping Use   Vaping status: Never Used  Substance Use Topics   Alcohol use: No   Drug use: No   No orders of the defined types were placed in this encounter.  Current Outpatient Medications  Medication Instructions   celecoxib (CELEBREX) 100 mg, Oral, Daily   citalopram (CELEXA) 20 mg, Oral, Daily   Cyanocobalamin (B-12 PO) 1 tablet, Oral, Daily   docusate sodium (COLACE) 100 mg, Oral, 2 times daily   glipiZIDE (GLUCOTROL XL) 10 mg, Oral, Daily   hydrochlorothiazide (HYDRODIURIL) 12.5 mg, Oral, Daily   lisinopril (ZESTRIL) 20 mg, Oral, Daily   metFORMIN (GLUCOPHAGE-XR) 500 mg, Oral, Daily with supper   methocarbamol (ROBAXIN) 500 mg, Oral, Every 6 hours PRN   Misc. Devices (COMMODE 3-IN-1) MISC Weight up to 350 lbs 3 in 1 commode   OZEMPIC, 0.25 OR 0.5 MG/DOSE, 2 MG/3ML SOPN Subcutaneous   polyethylene glycol (MIRALAX / GLYCOLAX) 17 g, Oral, Daily PRN   pravastatin (PRAVACHOL) 40 mg, Oral, Daily   Allergies  Allergen Reactions   Codeine Nausea Only    The procedure has been fully  reviewed with the patient; The risks and benefits of surgery have been discussed and explained and understood. Alternative treatment has also been reviewed, questions were encouraged and answered. The postoperative plan is also been reviewed.  Patient advised to stop Ozempic  Lab Results  Component Value Date   HGBA1C 7.2 (H) 04/19/2022  And now 07/04/22 6.9  BMI42 is an extra risk factor but px wishes to proceed

## 2022-12-12 NOTE — Patient Instructions (Signed)
Your surgery will be at [facility] by Dr Romeo Apple  plan to be in hospital overnight. The hospital will contact you with a preoperative appointment to discuss Anesthesia.  Please arrive on time or 15 minutes early for the preoperative appointment, they have a very tight schedule if you are late or do not come in your surgery will be cancelled.  The phone number for the preop area is [phone]. Please bring your medications with you for the appointment. They will tell you the arrival time for surgery and medication instructions when you have your preoperative evaluation. Do not wear nail polish the day of your surgery and if you take Phentermine you need to stop this medication ONE WEEK prior to your surgery. f you take Docia Barrier, Jardiance, or Steglatro) - Hold 72 hours before the procedure.  If you take Dwana Melena, [place] or Trulicity do not take for 8 days before your surgery. If you take Victoza, Rybelsis, Saxenda or Adlyxi stop 24 hours before the procedure. Please arrive at the hospital 2 hours before procedure if scheduled at 9:30 or later in the day or at the time the nurse tells you at your preoperative visit.   If you have my chart do not use the time given in my chart use the time given to you by the nurse during your preoperative visit.   Your surgery  time may change. Please be available for phone calls the day of your surgery and the day before. The Short Stay department may need to discuss changes about your surgery time. Not reaching the you could lead to procedure delays and possible cancellation.  You must have a ride home and someone to stay with you for 24 to 48 hours. The person taking you home will receive and sign for the your discharge instructions.  Please be prepared to give your support person's name and telephone number to [facility] [employer]. Dr Romeo Apple will need that name and phone number post procedure.   You will also get a call from a representative  of [employer], they have a machine that you will use in the first few weeks after surgery. It is called a CPM.   You will have home physical therapy for 2 weeks after surgery, the home health agency will call you before or just following the surgery to set up visits. [facility] is the agency we normally use, unless you request another agency.   You will get a call also from outpatient therapy for therapy starting when the home therapy is done.  If you have questions or need to Reschedule the surgery, call the office ask for Angeli Demilio.

## 2023-01-06 ENCOUNTER — Telehealth: Payer: Self-pay | Admitting: Orthopedic Surgery

## 2023-01-06 DIAGNOSIS — M1711 Unilateral primary osteoarthritis, right knee: Secondary | ICD-10-CM

## 2023-01-06 NOTE — Addendum Note (Signed)
Addended byCaffie Damme on: 01/06/2023 03:21 PM   Modules accepted: Orders

## 2023-01-06 NOTE — Telephone Encounter (Signed)
DR. Romeo Apple   Patient spouse Amada Jupiter called and wants to know when someone will call them about his surgery on 01/15/23  they have not heard from the hospital or with our office.Marland Kitchen

## 2023-01-06 NOTE — Telephone Encounter (Signed)
They can call the hospital, would think they should have heard by now Gave her the number

## 2023-01-08 ENCOUNTER — Encounter (HOSPITAL_COMMUNITY): Payer: Self-pay

## 2023-01-08 ENCOUNTER — Encounter (HOSPITAL_COMMUNITY)
Admission: RE | Admit: 2023-01-08 | Discharge: 2023-01-08 | Disposition: A | Discharge status: Home / Self Care | Payer: PPO | Source: Ambulatory Visit | Attending: Orthopedic Surgery | Admitting: Orthopedic Surgery

## 2023-01-08 DIAGNOSIS — Z01812 Encounter for preprocedural laboratory examination: Secondary | ICD-10-CM | POA: Insufficient documentation

## 2023-01-08 DIAGNOSIS — Z01818 Encounter for other preprocedural examination: Secondary | ICD-10-CM

## 2023-01-08 DIAGNOSIS — M1711 Unilateral primary osteoarthritis, right knee: Secondary | ICD-10-CM | POA: Diagnosis not present

## 2023-01-08 HISTORY — DX: Other specified postprocedural states: Z98.890

## 2023-01-08 HISTORY — DX: Nausea with vomiting, unspecified: R11.2

## 2023-01-08 LAB — CBC WITH DIFFERENTIAL/PLATELET
Abs Immature Granulocytes: 0.05 K/uL (ref 0.00–0.07)
Basophils Absolute: 0.1 K/uL (ref 0.0–0.1)
Basophils Relative: 2 %
Eosinophils Absolute: 0.3 K/uL (ref 0.0–0.5)
Eosinophils Relative: 5 %
HCT: 48 % (ref 39.0–52.0)
Hemoglobin: 15.8 g/dL (ref 13.0–17.0)
Immature Granulocytes: 1 %
Lymphocytes Relative: 31 %
Lymphs Abs: 1.4 K/uL (ref 0.7–4.0)
MCH: 30.2 pg (ref 26.0–34.0)
MCHC: 32.9 g/dL (ref 30.0–36.0)
MCV: 91.6 fL (ref 80.0–100.0)
Monocytes Absolute: 0.4 K/uL (ref 0.1–1.0)
Monocytes Relative: 9 %
Neutro Abs: 2.4 K/uL (ref 1.7–7.7)
Neutrophils Relative %: 52 %
Platelets: 178 K/uL (ref 150–400)
RBC: 5.24 MIL/uL (ref 4.22–5.81)
RDW: 13.5 % (ref 11.5–15.5)
WBC: 4.6 K/uL (ref 4.0–10.5)
nRBC: 0 % (ref 0.0–0.2)

## 2023-01-08 LAB — BASIC METABOLIC PANEL WITH GFR
Anion gap: 9 (ref 5–15)
BUN: 18 mg/dL (ref 8–23)
CO2: 24 mmol/L (ref 22–32)
Calcium: 9.5 mg/dL (ref 8.9–10.3)
Chloride: 101 mmol/L (ref 98–111)
Creatinine, Ser: 0.82 mg/dL (ref 0.61–1.24)
GFR, Estimated: >60 mL/min
Glucose, Bld: 171 mg/dL — ABNORMAL HIGH (ref 70–99)
Potassium: 4 mmol/L (ref 3.5–5.1)
Sodium: 134 mmol/L — ABNORMAL LOW (ref 135–145)

## 2023-01-08 LAB — SURGICAL PCR SCREEN
MRSA, PCR: NEGATIVE
Staphylococcus aureus: NEGATIVE

## 2023-01-08 LAB — PREPARE RBC (CROSSMATCH)

## 2023-01-08 NOTE — Patient Instructions (Signed)
Ethan Oliver  01/08/2023     @PREFPERIOPPHARMACY @   Your procedure is scheduled on 01/15/23.  Report to [facility] at 6:00 A.M.  Call this number if you have problems the morning of surgery:  [phone]  If you experience any cold or flu symptoms such as cough, fever, chills, shortness of breath, etc. between now and your scheduled surgery, please notify us at the above number.   Remember:   Do not eat  after midnight.   You may drink clear liquids until 3:30 am .    Clear liquids allowed are:                    Water, Carbonated beverages (diabetics please choose diet or no sugar options), Clear Tea (No creamer, milk, or cream, including half & half and powdered creamer), Black Coffee Only (No creamer, milk or cream, including half & half and powdered creamer), and Clear Sports drink (No red color; diabetics please choose diet or no sugar options)    Take these medicines the morning of surgery with A SIP OF WATER : Celexa   Last dose of Ozempic should be on 10/29 or before.   No diabetic meds the am of the procedure.    Do not wear jewelry, make-up or nail polish, including gel polish,  artificial nails, or any other type of covering on natural nails (fingers and  toes).  Do not wear lotions, powders, or perfumes, or deodorant.  Do not shave 48 hours prior to surgery.  Men may shave face and neck.  Do not bring valuables to the hospital.  [facility] is not responsible for any belongings or valuables.  Contacts, dentures or bridgework may not be worn into surgery.  Leave your suitcase in the car.  After surgery it may be brought to your room.  For patients admitted to the hospital, discharge time will be determined by your treatment team.  Patients discharged the day of surgery will not be allowed to drive home.   Name and phone number of your driver:   Family Special instructions:  N/A  Please read over the following fact sheets that you were given. Surgical Site  Infection Prevention, Anesthesia Post-op Instructions, and Care and Recovery After Surgery   Total Knee Replacement  Total knee replacement is a procedure to replace the damaged knee joint with an artificial (prosthetic) knee joint. The purpose of this surgery is to reduce knee pain and improve knee function. The prosthetic knee joint (prosthesis) may be made of metal, plastic, or ceramic. It replaces parts of the thigh bone (femur), lower leg bone (tibia), and kneecap (patella) that are removed during the procedure. Tell a health care provider about: Any allergies you have. All medicines you are taking, including vitamins, herbs, eye drops, creams, and over-the-counter medicines. Any problems you or family members have had with anesthetic medicines. Any blood disorders you have. Any surgeries you have had. Any medical conditions you have. Whether you are pregnant or may be pregnant. What are the risks? Generally, this is a safe procedure. However, problems may occur, including: Infection. Bleeding. A blood clot that forms in your leg. The clot may break loose and travel to your lungs (pulmonary embolism). Allergic reactions to medicines. Damage to nerves or other structures. Problems with the knee, such as: Decreased range of motion of the knee. Instability of the knee. Loosening of the prosthetic joint. Knee pain that does not go away. What happens before the  procedure? Staying hydrated Follow instructions from your health care provider about hydration, which may include: Up to 2 hours before the procedure - you may continue to drink clear liquids, such as water, clear fruit juice, black coffee, and plain tea.  Eating and drinking restrictions Follow instructions from your health care provider about eating and drinking, which may include: 8 hours before the procedure - stop eating heavy meals or foods, such as meat, fried foods, or fatty foods. 6 hours before the procedure - stop  eating light meals or foods, such as toast or cereal. 6 hours before the procedure - stop drinking milk or drinks that contain milk. 2 hours before the procedure - stop drinking clear liquids. Medicines Ask your health care provider about: Changing or stopping your regular medicines. This is especially important if you are taking diabetes medicines or blood thinners. Taking medicines such as aspirin and ibuprofen. These medicines can thin your blood. Do not take these medicines unless your health care provider tells you to take them. Taking over-the-counter medicines, vitamins, herbs, and supplements. Tests and exams You may have: A physical exam. Tests, such as X-rays, MRI, CT scan, or bone scan. A blood or urine sample taken. Lifestyle  Keep your body and teeth clean. Germs from anywhere in your body can travel to your new joint and infect it. Tell your health care provider if you: Plan to have dental care and routine cleanings. Develop any skin infections. If your health care provider prescribes physical therapy, do exercises as instructed. If you are overweight, work with your health care provider to reach a safe weight. Extra weight can put pressure on your knee. Do not use any products that contain nicotine or tobacco for at least 4 weeks before the procedure. These products include cigarettes, chewing tobacco, and vaping devices, such as e-cigarettes. If you need help quitting, ask your health care provider. Surgery safety Ask your health care provider: How your surgery site will be marked. What steps will be taken to help prevent infection. These steps may include: Removing hair at the surgery site. Washing skin with a germ-killing soap. Taking antibiotic medicine. General instructions Do not shave your legs just before surgery. If hair removal is needed, it will be done in the hospital. Plan to have a responsible adult take you home from the hospital or clinic. Plan to have a  responsible adult care for you for the time you are told after you leave the hospital or clinic. This is important. It is recommended that you have someone to help care for you for at least 4-6 weeks after your procedure. What happens during the procedure? An IV will be inserted into one of your veins. You will be given one or more of the following: A medicine to help you relax (sedative). A medicine that is injected into an area of your body near the nerves to numb everything below the injection site (peripheral nerve block). A medicine that is injected into your spine to numb the area below and slightly above the injection site (spinal anesthetic). A medicine to make you fall asleep (general anesthetic). An incision will be made in your knee. Damaged cartilage and bone will be removed from your femur, tibia, and patella. Parts of the prosthesis (liners) will be placed over the areas of bone and cartilage that were removed. A metal liner will be placed over your femur, and plastic liners will be placed over your tibia and the underside of your patella. Your incision  will be closed with stitches (sutures), staples, skin glue, or adhesive strips. A bandage (dressing) will be placed over your incision. The procedure may vary among health care providers and hospitals. What happens after the procedure? Your blood pressure, heart rate, breathing rate, and blood oxygen level will be monitored until you leave the hospital or clinic. You will be given medicines to help manage pain. You may: Continue to receive fluids through an IV. Have to wear compression stockings. These stockings help to prevent blood clots and reduce swelling in your legs. You will be encouraged to move. A physical therapist will teach you how to use crutches or a walker and how to exercise at home. Do not drive until your health care provider approves. Summary Total knee replacement is a procedure to replace the knee joint with  an artificial knee joint. Before the procedure, follow instructions from your health care provider about eating and drinking. Plan to have a responsible adult take you home from the hospital or clinic. This information is not intended to replace advice given to you by your health care provider. Make sure you discuss any questions you have with your health care provider. Document Revised: 08/16/2019 Document Reviewed: 08/17/2019 Elsevier Patient Education  2024 [employer]  General Anesthesia, Adult General anesthesia is the use of medicine to make you fall asleep (unconscious) for a medical procedure. General anesthesia must be used for certain procedures. It is often recommended for surgery or procedures that: Last a long time. Require you to be still or in an unusual position. Are major and can cause blood loss. Affect your breathing. The medicines used for general anesthesia are called general anesthetics. During general anesthesia, these medicines are given along with medicines that: Prevent pain. Control your blood pressure. Relax your muscles. Prevent nausea and vomiting after the procedure. Tell a health care provider about: Any allergies you have. All medicines you are taking, including vitamins, herbs, eye drops, creams, and over-the-counter medicines. Your history of any: Medical conditions you have, including: High blood pressure. Bleeding problems. Diabetes. Heart or lung conditions, such as: Heart failure. Sleep apnea. Asthma. Chronic obstructive pulmonary disease (COPD). Current or recent illnesses, such as: Upper respiratory, chest, or ear infections. Cough or fever. Tobacco or drug use, including marijuana or alcohol use. Depression or anxiety. Surgeries and types of anesthetics you have had. Problems you or family members have had with anesthetic medicines. Whether you are pregnant or may be pregnant. Whether you have any chipped or loose teeth, dentures,  caps, bridgework, or issues with your mouth, swallowing, or choking. What are the risks? Your health care provider will talk with you about risks. These may include: Allergic reaction to the medicines. Lung and heart problems. Inhaling food or liquid from the stomach into the lungs (aspiration). Nerve injury. Injury to the lips, mouth, teeth, or gums. Stroke. Waking up during your procedure and being unable to move. This is rare. These problems are more likely to develop if you are having a major surgery or if you have an advanced or serious medical condition. You can prevent some of these complications by answering all of your health care provider's questions thoroughly and by following all instructions before your procedure. General anesthesia can cause side effects, including: Nausea or vomiting. A sore throat or hoarseness from the breathing tube. Wheezing or coughing. Shaking chills or feeling cold. Body aches. Sleepiness. Confusion, agitation (delirium), or anxiety. What happens before the procedure? When to stop eating and drinking Follow instructions  from your health care provider about what you may eat and drink before your procedure. If you do not follow your health care provider's instructions, your procedure may be delayed or canceled. Medicines Ask your health care provider about: Changing or stopping your regular medicines. These include any diabetes medicines or blood thinners you take. Taking medicines such as aspirin and ibuprofen. These medicines can thin your blood. Do not take them unless your health care provider tells you to. Taking over-the-counter medicines, vitamins, herbs, and supplements. General instructions Do not use any products that contain nicotine or tobacco for at least 4 weeks before the procedure. These products include cigarettes, chewing tobacco, and vaping devices, such as e-cigarettes. If you need help quitting, ask your health care provider. If  you brush your teeth on the morning of the procedure, make sure to spit out all of the water and toothpaste. If told by your health care provider, bring your sleep apnea device with you to surgery (if applicable). If you will be going home right after the procedure, plan to have a responsible adult: Take you home from the hospital or clinic. You will not be allowed to drive. Care for you for the time you are told. What happens during the procedure?  An IV will be inserted into one of your veins. You will be given one or more of the following through a face mask or IV: A sedative. This helps you relax. Anesthesia. This will: Numb certain areas of your body. Make you fall asleep for surgery. After you are unconscious, a breathing tube may be inserted down your throat to help you breathe. This will be removed before you wake up. An anesthesia provider, such as an anesthesiologist, will stay with you throughout your procedure. The anesthesia provider will: Keep you comfortable and safe by continuing to give you medicines and adjusting the amount of medicine that you get. Monitor your blood pressure, heart rate, and oxygen levels to make sure that the anesthetics do not cause any problems. The procedure may vary among health care providers and hospitals. What happens after the procedure? Your blood pressure, temperature, heart rate, breathing rate, and blood oxygen level will be monitored until you leave the hospital or clinic. You will wake up in a recovery area. You may wake up slowly. You may be given medicine to help you with pain, nausea, or any other side effects from the anesthesia. Summary General anesthesia is the use of medicine to make you fall asleep (unconscious) for a medical procedure. Follow your health care provider's instructions about when to stop eating, drinking, or taking certain medicines before your procedure. Plan to have a responsible adult take you home from the  hospital or clinic. This information is not intended to replace advice given to you by your health care provider. Make sure you discuss any questions you have with your health care provider. Document Revised: 05/24/2021 Document Reviewed: 05/24/2021 Elsevier Patient Education  2024 [employer]  How to Use Chlorhexidine Before Surgery Chlorhexidine gluconate (CHG) is a germ-killing (antiseptic) solution that is used to clean the skin. It can get rid of the bacteria that normally live on the skin and can keep them away for about 24 hours. To clean your skin with CHG, you may be given: A CHG solution to use in the shower or as part of a sponge bath. A prepackaged cloth that contains CHG. Cleaning your skin with CHG may help lower the risk for infection: While you are staying  in the intensive care unit of the hospital. If you have a vascular access, such as a central line, to provide short-term or long-term access to your veins. If you have a catheter to drain urine from your bladder. If you are on a ventilator. A ventilator is a machine that helps you breathe by moving air in and out of your lungs. After surgery. What are the risks? Risks of using CHG include: A skin reaction. Hearing loss, if CHG gets in your ears and you have a perforated eardrum. Eye injury, if CHG gets in your eyes and is not rinsed out. The CHG product catching fire. Make sure that you avoid smoking and flames after applying CHG to your skin. Do not use CHG: If you have a chlorhexidine allergy or have previously reacted to chlorhexidine. On babies younger than 65 months of age. How to use CHG solution Use CHG only as told by your health care provider, and follow the instructions on the label. Use the full amount of CHG as directed. Usually, this is one bottle. During a shower Follow these steps when using CHG solution during a shower (unless your health care provider gives you different instructions): Start the  shower. Use your normal soap and shampoo to wash your face and hair. Turn off the shower or move out of the shower stream. Pour the CHG onto a clean washcloth. Do not use any type of brush or rough-edged sponge. Starting at your neck, lather your body down to your toes. Make sure you follow these instructions: If you will be having surgery, pay special attention to the part of your body where you will be having surgery. Scrub this area for at least 1 minute. Do not use CHG on your head or face. If the solution gets into your ears or eyes, rinse them well with water. Avoid your genital area. Avoid any areas of skin that have broken skin, cuts, or scrapes. Scrub your back and under your arms. Make sure to wash skin folds. Let the lather sit on your skin for 1-2 minutes or as long as told by your health care provider. Thoroughly rinse your entire body in the shower. Make sure that all body creases and crevices are rinsed well. Dry off with a clean towel. Do not put any substances on your body afterward--such as powder, lotion, or perfume--unless you are told to do so by your health care provider. Only use lotions that are recommended by the manufacturer. Put on clean clothes or pajamas. If it is the night before your surgery, sleep in clean sheets.  During a sponge bath Follow these steps when using CHG solution during a sponge bath (unless your health care provider gives you different instructions): Use your normal soap and shampoo to wash your face and hair. Pour the CHG onto a clean washcloth. Starting at your neck, lather your body down to your toes. Make sure you follow these instructions: If you will be having surgery, pay special attention to the part of your body where you will be having surgery. Scrub this area for at least 1 minute. Do not use CHG on your head or face. If the solution gets into your ears or eyes, rinse them well with water. Avoid your genital area. Avoid any areas of  skin that have broken skin, cuts, or scrapes. Scrub your back and under your arms. Make sure to wash skin folds. Let the lather sit on your skin for 1-2 minutes or as long as  told by your health care provider. Using a different clean, wet washcloth, thoroughly rinse your entire body. Make sure that all body creases and crevices are rinsed well. Dry off with a clean towel. Do not put any substances on your body afterward--such as powder, lotion, or perfume--unless you are told to do so by your health care provider. Only use lotions that are recommended by the manufacturer. Put on clean clothes or pajamas. If it is the night before your surgery, sleep in clean sheets. How to use CHG prepackaged cloths Only use CHG cloths as told by your health care provider, and follow the instructions on the label. Use the CHG cloth on clean, dry skin. Do not use the CHG cloth on your head or face unless your health care provider tells you to. When washing with the CHG cloth: Avoid your genital area. Avoid any areas of skin that have broken skin, cuts, or scrapes. Before surgery Follow these steps when using a CHG cloth to clean before surgery (unless your health care provider gives you different instructions): Using the CHG cloth, vigorously scrub the part of your body where you will be having surgery. Scrub using a back-and-forth motion for 3 minutes. The area on your body should be completely wet with CHG when you are done scrubbing. Do not rinse. Discard the cloth and let the area air-dry. Do not put any substances on the area afterward, such as powder, lotion, or perfume. Put on clean clothes or pajamas. If it is the night before your surgery, sleep in clean sheets.  For general bathing Follow these steps when using CHG cloths for general bathing (unless your health care provider gives you different instructions). Use a separate CHG cloth for each area of your body. Make sure you wash between any folds of  skin and between your fingers and toes. Wash your body in the following order, switching to a new cloth after each step: The front of your neck, shoulders, and chest. Both of your arms, under your arms, and your hands. Your stomach and groin area, avoiding the genitals. Your right leg and foot. Your left leg and foot. The back of your neck, your back, and your buttocks. Do not rinse. Discard the cloth and let the area air-dry. Do not put any substances on your body afterward--such as powder, lotion, or perfume--unless you are told to do so by your health care provider. Only use lotions that are recommended by the manufacturer. Put on clean clothes or pajamas. Contact a health care provider if: Your skin gets irritated after scrubbing. You have questions about using your solution or cloth. You swallow any chlorhexidine. Call your local poison control center ([phone] in the [place] Get help right away if: Your eyes itch badly, or they become very red or swollen. Your skin itches badly and is red or swollen. Your hearing changes. You have trouble seeing. You have swelling or tingling in your mouth or throat. You have trouble breathing. These symptoms may represent a serious problem that is an emergency. Do not wait to see if the symptoms will go away. Get medical help right away. Call your local emergency services (911 in the [place] Do not drive yourself to the hospital. Summary Chlorhexidine gluconate (CHG) is a germ-killing (antiseptic) solution that is used to clean the skin. Cleaning your skin with CHG may help to lower your risk for infection. You may be given CHG to use for bathing. It may be in a bottle or in a  prepackaged cloth to use on your skin. Carefully follow your health care provider's instructions and the instructions on the product label. Do not use CHG if you have a chlorhexidine allergy. Contact your health care provider if your skin gets irritated after  scrubbing. This information is not intended to replace advice given to you by your health care provider. Make sure you discuss any questions you have with your health care provider. Document Revised: 06/25/2021 Document Reviewed: 05/08/2020 Elsevier Patient Education  2023 [employer].

## 2023-01-14 NOTE — H&P (Signed)
Preop evaluation for right total knee         Encounter Diagnoses  Name Primary?   Status post total right knee replacement     Primary osteoarthritis of right knee Yes   Body mass index 40.0-44.9, adult (HCC)     Morbid obesity (HCC)          Chief complaint right knee pain   History of present illness this is a 67 year old male with diabetes who presents for right total knee arthroplasty   He complains of longstanding disabling right knee pain on the medial side of his knee which interferes with his activities of daily living.   He is on Ozempic he has undergone nonsurgical management including Weight loss Activity modification Judicious use of anti-inflammatories Injection   He did not improve.  He underwent successful left total knee and complains of some lateral incision numbness and occasional pain in the front of the knee just superior to the patella   Review of systems currently has no chest pain shortness of breath fever chills nausea easy bruising or bleeding   BP (!) 137/91   Pulse 84   Ht 5\' 11"  (1.803 m)   Wt (!) 304 lb (137.9 kg)   BMI 42.40 kg/m      Physical Exam Vitals and nursing note reviewed.  Constitutional:      Appearance: Normal appearance.  HENT:     Head: Normocephalic and atraumatic.  Eyes:     General: No scleral icterus.       Right eye: No discharge.        Left eye: No discharge.     Extraocular Movements: Extraocular movements intact.     Conjunctiva/sclera: Conjunctivae normal.     Pupils: Pupils are equal, round, and reactive to light.  Cardiovascular:     Rate and Rhythm: Normal rate.     Pulses: Normal pulses.     Comments: Mild rubor both lower extremities from chronic edema Musculoskeletal:     Right lower leg: Edema present.     Left lower leg: Edema present.     Comments: His upper extremities are normal   His left total knee has a midline incision which healed well.  His knee flexion is approximate 120 degrees on  the left with a stable knee   Right knee   Right knee is in mild varus but comes to full extension small joint effusion noted most of the tenderness is on the medial joint line there is crepitance on range of motion he has up to 120 degrees of flexion on the right   No instability detected     Skin:    General: Skin is warm and dry.     Capillary Refill: Capillary refill takes less than 2 seconds.  Neurological:     General: No focal deficit present.     Mental Status: He is alert and oriented to person, place, and time.  Psychiatric:        Mood and Affect: Mood normal.        Behavior: Behavior normal.        Thought Content: Thought content normal.        Judgment: Judgment normal.            Past Medical History:  Diagnosis Date   Arthritis     Chronic back pain     Depression     Diabetes mellitus without complication (HCC)     History of gout  History of kidney stones     Hypercholesteremia     Hypertension               Past Surgical History:  Procedure Laterality Date   ACHILLES TENDON SURGERY Left 12/29/2013    Procedure: PERCUTANEOUS TENDO-ACHILLES LENGTHENING;  Surgeon: Dallas Schimke, DPM;  Location: [facility];  Service: Podiatry;  Laterality: Left;   CALCANEAL OSTEOTOMY WITH ILIAC CREST BONE GRAFT AND REPAIR SUBLEXING TENDON AND ACHILLES TENDON Left 12/29/2013    Procedure: CALCANEOCUBOID JOINT FUSION PERFORMED WITH BONE GRAFT;  Surgeon: Dallas Schimke, DPM;  Location: [facility];  Service: Podiatry;  Laterality: Left;   CARPAL TUNNEL RELEASE Bilateral     COLONOSCOPY WITH PROPOFOL N/A 05/02/2021    Procedure: COLONOSCOPY WITH PROPOFOL;  Surgeon: Malissa Hippo, MD;  Location: [facility];  Service: Endoscopy;  Laterality: N/A;  930   Disk fusion        lumbar   FLEXOR TENOTOMY  Right 02/18/2018    Procedure: PERCUTANEOUS FLEXOR TENOTOMY/APPLICATION OF BK POSTERIOR SPLINT RLE;  Surgeon: Ferman Hamming, DPM;  Location: [facility];  Service:  Podiatry;  Laterality: Right;   FOOT ARTHRODESIS Left 12/29/2013    Procedure: TRIPLE ARTHRODESIS FOOT;  Surgeon: Dallas Schimke, DPM;  Location: [facility];  Service: Podiatry;  Laterality: Left;   FOOT ARTHRODESIS Right 02/18/2018    Procedure: TRIPLE ARTHRODESIS RIGHT FOOT;  Surgeon: Ferman Hamming, DPM;  Location: [facility];  Service: Podiatry;  Laterality: Right;   KNEE SURGERY Right      Surgery on this knee twice.    POLYPECTOMY   05/02/2021    Procedure: POLYPECTOMY INTESTINAL;  Surgeon: Malissa Hippo, MD;  Location: [facility];  Service: Endoscopy;;   TOTAL KNEE ARTHROPLASTY Left 04/23/2022    Procedure: TOTAL KNEE ARTHROPLASTY;  Surgeon: Vickki Hearing, MD;  Location: [facility];  Service: Orthopedics;  Laterality: Left;             Family History  Problem Relation Age of Onset   Healthy Mother     Healthy Father          Social History  Social History         Tobacco Use   Smoking status: Never   Smokeless tobacco: Former      Types: Chew      Quit date: 09/21/2013  Vaping Use   Vaping status: Never Used  Substance Use Topics   Alcohol use: No   Drug use: No      No orders of the defined types were placed in this encounter.       Current Outpatient Medications  Medication Instructions   celecoxib (CELEBREX) 100 mg, Oral, Daily   citalopram (CELEXA) 20 mg, Oral, Daily   Cyanocobalamin (B-12 PO) 1 tablet, Oral, Daily   docusate sodium (COLACE) 100 mg, Oral, 2 times daily   glipiZIDE (GLUCOTROL XL) 10 mg, Oral, Daily   hydrochlorothiazide (HYDRODIURIL) 12.5 mg, Oral, Daily   lisinopril (ZESTRIL) 20 mg, Oral, Daily   metFORMIN (GLUCOPHAGE-XR) 500 mg, Oral, Daily with supper   methocarbamol (ROBAXIN) 500 mg, Oral, Every 6 hours PRN   Misc. Devices (COMMODE 3-IN-1) MISC Weight up to 350 lbs 3 in 1 commode   OZEMPIC, 0.25 OR 0.5 MG/DOSE, 2 MG/3ML SOPN Subcutaneous   polyethylene glycol (MIRALAX / GLYCOLAX) 17 g, Oral, Daily PRN   pravastatin  (PRAVACHOL) 40 mg, Oral, Daily    Allergies      Allergies  Allergen  Reactions   Codeine Nausea Only        The procedure has been fully reviewed with the patient; The risks and benefits of surgery have been discussed and explained and understood. Alternative treatment has also been reviewed, questions were encouraged and answered. The postoperative plan is also been reviewed.   Patient advised to stop Ozempic   Recent Labs       Lab Results  Component Value Date    HGBA1C 7.2 (H) 04/19/2022    And now 07/04/22 6.9   BMI42 is an extra risk factor but px wishes to proceed

## 2023-01-15 ENCOUNTER — Ambulatory Visit (HOSPITAL_COMMUNITY): Payer: PPO

## 2023-01-15 ENCOUNTER — Other Ambulatory Visit: Payer: Self-pay

## 2023-01-15 ENCOUNTER — Observation Stay (HOSPITAL_COMMUNITY)
Admission: RE | Admit: 2023-01-15 | Discharge: 2023-01-16 | Disposition: A | Discharge status: Home / Self Care | Payer: PPO | Attending: Orthopedic Surgery | Admitting: Orthopedic Surgery

## 2023-01-15 ENCOUNTER — Encounter (HOSPITAL_COMMUNITY): Payer: Self-pay | Admitting: Orthopedic Surgery

## 2023-01-15 ENCOUNTER — Ambulatory Visit (HOSPITAL_COMMUNITY): Payer: PPO | Admitting: Certified Registered"

## 2023-01-15 ENCOUNTER — Ambulatory Visit (HOSPITAL_BASED_OUTPATIENT_CLINIC_OR_DEPARTMENT_OTHER): Payer: PPO | Admitting: Certified Registered"

## 2023-01-15 ENCOUNTER — Encounter (HOSPITAL_COMMUNITY)
Admission: RE | Disposition: A | Discharge status: Home / Self Care | Payer: Self-pay | Source: Home / Self Care | Attending: Orthopedic Surgery

## 2023-01-15 DIAGNOSIS — M1711 Unilateral primary osteoarthritis, right knee: Principal | ICD-10-CM

## 2023-01-15 DIAGNOSIS — Z79899 Other long term (current) drug therapy: Secondary | ICD-10-CM | POA: Diagnosis not present

## 2023-01-15 DIAGNOSIS — Z87891 Personal history of nicotine dependence: Secondary | ICD-10-CM | POA: Diagnosis not present

## 2023-01-15 DIAGNOSIS — I10 Essential (primary) hypertension: Secondary | ICD-10-CM | POA: Insufficient documentation

## 2023-01-15 DIAGNOSIS — E119 Type 2 diabetes mellitus without complications: Secondary | ICD-10-CM | POA: Diagnosis not present

## 2023-01-15 DIAGNOSIS — Z7984 Long term (current) use of oral hypoglycemic drugs: Secondary | ICD-10-CM | POA: Insufficient documentation

## 2023-01-15 DIAGNOSIS — Z96652 Presence of left artificial knee joint: Secondary | ICD-10-CM | POA: Diagnosis not present

## 2023-01-15 HISTORY — PX: TOTAL KNEE ARTHROPLASTY: SHX125

## 2023-01-15 LAB — GLUCOSE, CAPILLARY
Glucose-Capillary: 167 mg/dL — ABNORMAL HIGH (ref 70–99)
Glucose-Capillary: 224 mg/dL — ABNORMAL HIGH (ref 70–99)
Glucose-Capillary: 209 mg/dL — ABNORMAL HIGH (ref 70–99)
Glucose-Capillary: 213 mg/dL — ABNORMAL HIGH (ref 70–99)

## 2023-01-15 LAB — HEMOGLOBIN A1C
Hgb A1c MFr Bld: 7.6 % — ABNORMAL HIGH (ref 4.8–5.6)
Mean Plasma Glucose: 171.42 mg/dL

## 2023-01-15 SURGERY — ARTHROPLASTY, KNEE, TOTAL
Anesthesia: Spinal | Site: Knee | Laterality: Right

## 2023-01-15 MED ORDER — SUCCINYLCHOLINE CHLORIDE 200 MG/10ML IV SOSY
PREFILLED_SYRINGE | INTRAVENOUS | Status: AC
  Filled 2023-01-15: qty 10

## 2023-01-15 MED ORDER — ONDANSETRON HCL 4 MG/2ML IJ SOLN
4.0000 mg | Freq: Once | INTRAMUSCULAR | Status: AC | PRN
  Administered 2023-01-15: 4 mg via INTRAVENOUS
  Filled 2023-01-15: qty 2

## 2023-01-15 MED ORDER — ONDANSETRON HCL 4 MG/2ML IJ SOLN
INTRAMUSCULAR | Status: AC
  Filled 2023-01-15: qty 2

## 2023-01-15 MED ORDER — ASPIRIN 81 MG PO CHEW
81.0000 mg | CHEWABLE_TABLET | Freq: Two times a day (BID) | ORAL | Status: DC
Start: 2023-01-15 — End: 2023-01-16
  Administered 2023-01-15 – 2023-01-16 (×2): 81 mg via ORAL
  Filled 2023-01-15 (×2): qty 1

## 2023-01-15 MED ORDER — HYDROMORPHONE HCL 1 MG/ML IJ SOLN
0.5000 mg | INTRAMUSCULAR | Status: DC | PRN
  Administered 2023-01-15: 1 mg via INTRAVENOUS
  Filled 2023-01-15: qty 1

## 2023-01-15 MED ORDER — CEFAZOLIN IN SODIUM CHLORIDE 3-0.9 GM/100ML-% IV SOLN
INTRAVENOUS | Status: AC
  Filled 2023-01-15: qty 100

## 2023-01-15 MED ORDER — FENTANYL CITRATE (PF) 100 MCG/2ML IJ SOLN
INTRAMUSCULAR | Status: AC
  Filled 2023-01-15: qty 2

## 2023-01-15 MED ORDER — CHLORHEXIDINE GLUCONATE 0.12 % MT SOLN
OROMUCOSAL | Status: AC
  Filled 2023-01-15: qty 15

## 2023-01-15 MED ORDER — ROCURONIUM BROMIDE 10 MG/ML (PF) SYRINGE
PREFILLED_SYRINGE | INTRAVENOUS | Status: AC
  Filled 2023-01-15: qty 10

## 2023-01-15 MED ORDER — ROPIVACAINE HCL 5 MG/ML IJ SOLN
INTRAMUSCULAR | Status: AC
  Filled 2023-01-15: qty 30

## 2023-01-15 MED ORDER — TRANEXAMIC ACID-NACL 1000-0.7 MG/100ML-% IV SOLN
1000.0000 mg | INTRAVENOUS | Status: AC
  Administered 2023-01-15: 1000 mg via INTRAVENOUS

## 2023-01-15 MED ORDER — OXYCODONE HCL 5 MG PO TABS
5.0000 mg | ORAL_TABLET | Freq: Once | ORAL | Status: AC | PRN
  Administered 2023-01-15: 5 mg via ORAL
  Filled 2023-01-15: qty 1

## 2023-01-15 MED ORDER — VECURONIUM BROMIDE 10 MG IV SOLR
INTRAVENOUS | Status: AC
  Filled 2023-01-15: qty 10

## 2023-01-15 MED ORDER — CEFAZOLIN SODIUM-DEXTROSE 2-4 GM/100ML-% IV SOLN
2.0000 g | Freq: Four times a day (QID) | INTRAVENOUS | Status: AC
  Administered 2023-01-15 (×2): 2 g via INTRAVENOUS
  Filled 2023-01-15 (×2): qty 100

## 2023-01-15 MED ORDER — SODIUM CHLORIDE 0.9 % IV SOLN: INTRAVENOUS | Status: DC

## 2023-01-15 MED ORDER — DEXAMETHASONE SODIUM PHOSPHATE 10 MG/ML IJ SOLN
INTRAMUSCULAR | Status: AC
  Filled 2023-01-15: qty 1

## 2023-01-15 MED ORDER — METHOCARBAMOL 1000 MG/10ML IJ SOLN: 500.0000 mg | Freq: Four times a day (QID) | INTRAMUSCULAR | Status: DC | PRN

## 2023-01-15 MED ORDER — CITALOPRAM HYDROBROMIDE 20 MG PO TABS
20.0000 mg | ORAL_TABLET | Freq: Every morning | ORAL | Status: DC
  Administered 2023-01-16: 20 mg via ORAL
  Filled 2023-01-15: qty 1

## 2023-01-15 MED ORDER — DOCUSATE SODIUM 100 MG PO CAPS
100.0000 mg | ORAL_CAPSULE | Freq: Two times a day (BID) | ORAL | Status: DC
  Administered 2023-01-15 – 2023-01-16 (×2): 100 mg via ORAL
  Filled 2023-01-15 (×2): qty 1

## 2023-01-15 MED ORDER — ONDANSETRON HCL 4 MG/2ML IJ SOLN
INTRAMUSCULAR | Status: DC | PRN
  Administered 2023-01-15: 4 mg via INTRAVENOUS

## 2023-01-15 MED ORDER — MENTHOL 3 MG MT LOZG: 1.0000 | LOZENGE | OROMUCOSAL | Status: DC | PRN

## 2023-01-15 MED ORDER — CELECOXIB 100 MG PO CAPS
200.0000 mg | ORAL_CAPSULE | Freq: Two times a day (BID) | ORAL | Status: DC
  Administered 2023-01-15 – 2023-01-16 (×3): 200 mg via ORAL
  Filled 2023-01-15 (×2): qty 2

## 2023-01-15 MED ORDER — METHOCARBAMOL 1000 MG/10ML IJ SOLN
INTRAMUSCULAR | Status: AC
  Filled 2023-01-15: qty 10

## 2023-01-15 MED ORDER — TRANEXAMIC ACID-NACL 1000-0.7 MG/100ML-% IV SOLN
INTRAVENOUS | Status: AC
  Filled 2023-01-15: qty 100

## 2023-01-15 MED ORDER — PREGABALIN 50 MG PO CAPS
ORAL_CAPSULE | ORAL | Status: AC
  Filled 2023-01-15: qty 1

## 2023-01-15 MED ORDER — GLIPIZIDE ER 5 MG PO TB24
10.0000 mg | ORAL_TABLET | Freq: Every day | ORAL | Status: DC
  Administered 2023-01-15: 10 mg via ORAL
  Filled 2023-01-15: qty 2

## 2023-01-15 MED ORDER — FENTANYL CITRATE PF 50 MCG/ML IJ SOSY
25.0000 ug | PREFILLED_SYRINGE | INTRAMUSCULAR | Status: DC | PRN
Start: 2023-01-15 — End: 2023-01-15
  Administered 2023-01-15 (×3): 50 ug via INTRAVENOUS
  Filled 2023-01-15 (×3): qty 1

## 2023-01-15 MED ORDER — LIDOCAINE HCL (PF) 2 % IJ SOLN
INTRAMUSCULAR | Status: AC
  Filled 2023-01-15: qty 5

## 2023-01-15 MED ORDER — SUGAMMADEX SODIUM 200 MG/2ML IV SOLN
INTRAVENOUS | Status: DC | PRN
  Administered 2023-01-15: 200 mg via INTRAVENOUS

## 2023-01-15 MED ORDER — METHOCARBAMOL 1000 MG/10ML IJ SOLN
500.0000 mg | Freq: Four times a day (QID) | INTRAMUSCULAR | Status: DC
  Administered 2023-01-15 – 2023-01-16 (×4): 500 mg via INTRAVENOUS
  Filled 2023-01-15 (×3): qty 10

## 2023-01-15 MED ORDER — KETAMINE HCL 10 MG/ML IJ SOLN
INTRAMUSCULAR | Status: DC | PRN
  Administered 2023-01-15: 15 mg via INTRAVENOUS
  Administered 2023-01-15: 25 mg via INTRAVENOUS

## 2023-01-15 MED ORDER — PRAVASTATIN SODIUM 40 MG PO TABS
40.0000 mg | ORAL_TABLET | Freq: Every morning | ORAL | Status: DC
  Administered 2023-01-16: 40 mg via ORAL
  Filled 2023-01-15: qty 1

## 2023-01-15 MED ORDER — SUCCINYLCHOLINE CHLORIDE 20 MG/ML IJ SOLN
INTRAMUSCULAR | Status: DC | PRN
  Administered 2023-01-15: 180 mg via INTRAVENOUS

## 2023-01-15 MED ORDER — CELECOXIB 100 MG PO CAPS: 200.0000 mg | ORAL_CAPSULE | Freq: Every day | ORAL | Status: DC

## 2023-01-15 MED ORDER — PREGABALIN 50 MG PO CAPS
50.0000 mg | ORAL_CAPSULE | Freq: Three times a day (TID) | ORAL | Status: DC
  Administered 2023-01-15 – 2023-01-16 (×4): 50 mg via ORAL
  Filled 2023-01-15 (×3): qty 1

## 2023-01-15 MED ORDER — ONDANSETRON HCL 4 MG/2ML IJ SOLN: 4.0000 mg | Freq: Four times a day (QID) | INTRAMUSCULAR | Status: DC | PRN

## 2023-01-15 MED ORDER — POLYETHYLENE GLYCOL 3350 17 G PO PACK: 17.0000 g | PACK | Freq: Every day | ORAL | Status: DC | PRN

## 2023-01-15 MED ORDER — ORAL CARE MOUTH RINSE: 15.0000 mL | Freq: Once | OROMUCOSAL | Status: DC

## 2023-01-15 MED ORDER — DEXAMETHASONE SODIUM PHOSPHATE 4 MG/ML IJ SOLN
INTRAMUSCULAR | Status: DC | PRN
  Administered 2023-01-15: 8 mg via INTRAVENOUS

## 2023-01-15 MED ORDER — INSULIN ASPART 100 UNIT/ML IJ SOLN
0.0000 [IU] | Freq: Three times a day (TID) | INTRAMUSCULAR | Status: DC
  Administered 2023-01-15: 7 [IU] via SUBCUTANEOUS

## 2023-01-15 MED ORDER — MIDAZOLAM HCL 2 MG/2ML IJ SOLN
INTRAMUSCULAR | Status: AC
Start: 2023-01-15 — End: ?
  Filled 2023-01-15: qty 2

## 2023-01-15 MED ORDER — 0.9 % SODIUM CHLORIDE (POUR BTL) OPTIME
TOPICAL | Status: DC | PRN
  Administered 2023-01-15: 1000 mL

## 2023-01-15 MED ORDER — METOCLOPRAMIDE HCL 5 MG/ML IJ SOLN: 5.0000 mg | Freq: Three times a day (TID) | INTRAMUSCULAR | Status: DC | PRN

## 2023-01-15 MED ORDER — OXYCODONE HCL 5 MG PO TABS
5.0000 mg | ORAL_TABLET | ORAL | Status: DC
  Administered 2023-01-15 – 2023-01-16 (×5): 5 mg via ORAL
  Filled 2023-01-15 (×5): qty 1

## 2023-01-15 MED ORDER — OXYCODONE HCL 5 MG PO TABS: 5.0000 mg | ORAL_TABLET | ORAL | Status: DC | PRN

## 2023-01-15 MED ORDER — PROPOFOL 10 MG/ML IV BOLUS
INTRAVENOUS | Status: DC | PRN
  Administered 2023-01-15: 100 mg via INTRAVENOUS
  Administered 2023-01-15: 150 mg via INTRAVENOUS

## 2023-01-15 MED ORDER — METHOCARBAMOL 500 MG PO TABS: 500.0000 mg | ORAL_TABLET | Freq: Four times a day (QID) | ORAL | Status: DC | PRN

## 2023-01-15 MED ORDER — METFORMIN HCL ER 750 MG PO TB24
750.0000 mg | ORAL_TABLET | Freq: Every day | ORAL | Status: DC
  Administered 2023-01-16: 750 mg via ORAL
  Filled 2023-01-15 (×2): qty 1

## 2023-01-15 MED ORDER — FENTANYL CITRATE (PF) 100 MCG/2ML IJ SOLN
INTRAMUSCULAR | Status: DC | PRN
  Administered 2023-01-15 (×2): 50 ug via INTRAVENOUS

## 2023-01-15 MED ORDER — MIDAZOLAM HCL 5 MG/5ML IJ SOLN
INTRAMUSCULAR | Status: DC | PRN
  Administered 2023-01-15 (×2): 1 mg via INTRAVENOUS

## 2023-01-15 MED ORDER — KETAMINE HCL 50 MG/5ML IJ SOSY
PREFILLED_SYRINGE | INTRAMUSCULAR | Status: AC
  Filled 2023-01-15: qty 5

## 2023-01-15 MED ORDER — CHLORHEXIDINE GLUCONATE 0.12 % MT SOLN: 15.0000 mL | Freq: Once | OROMUCOSAL | Status: DC

## 2023-01-15 MED ORDER — LISINOPRIL 10 MG PO TABS
20.0000 mg | ORAL_TABLET | Freq: Every morning | ORAL | Status: DC
  Administered 2023-01-16: 20 mg via ORAL
  Filled 2023-01-15: qty 2

## 2023-01-15 MED ORDER — LACTATED RINGERS IV SOLN: INTRAVENOUS | Status: DC | PRN

## 2023-01-15 MED ORDER — LACTATED RINGERS IV SOLN: INTRAVENOUS | Status: DC

## 2023-01-15 MED ORDER — PROPOFOL 10 MG/ML IV BOLUS
INTRAVENOUS | Status: AC
  Filled 2023-01-15: qty 20

## 2023-01-15 MED ORDER — VECURONIUM BROMIDE 10 MG IV SOLR
INTRAVENOUS | Status: DC | PRN
  Administered 2023-01-15: 3 mg via INTRAVENOUS
  Administered 2023-01-15: 2 mg via INTRAVENOUS
  Administered 2023-01-15: 1 mg via INTRAVENOUS

## 2023-01-15 MED ORDER — ACETAMINOPHEN 325 MG PO TABS: 325.0000 mg | ORAL_TABLET | Freq: Four times a day (QID) | ORAL | Status: DC | PRN

## 2023-01-15 MED ORDER — METOCLOPRAMIDE HCL 5 MG PO TABS: 5.0000 mg | ORAL_TABLET | Freq: Three times a day (TID) | ORAL | Status: DC | PRN

## 2023-01-15 MED ORDER — ONDANSETRON HCL 4 MG PO TABS: 4.0000 mg | ORAL_TABLET | Freq: Four times a day (QID) | ORAL | Status: DC | PRN

## 2023-01-15 MED ORDER — OXYCODONE HCL 5 MG/5ML PO SOLN: 5.0000 mg | Freq: Once | ORAL | Status: AC | PRN

## 2023-01-15 MED ORDER — PHENOL 1.4 % MT LIQD: 1.0000 | OROMUCOSAL | Status: DC | PRN

## 2023-01-15 MED ORDER — OXYCODONE HCL 5 MG PO TABS: 10.0000 mg | ORAL_TABLET | ORAL | Status: DC | PRN

## 2023-01-15 MED ORDER — BUPIVACAINE-MELOXICAM ER 200-6 MG/7ML IJ SOLN
INTRAMUSCULAR | Status: DC | PRN
  Administered 2023-01-15: 400 mg

## 2023-01-15 MED ORDER — POVIDONE-IODINE 10 % EX SWAB: 2.0000 | Freq: Once | CUTANEOUS | Status: DC

## 2023-01-15 MED ORDER — CEFAZOLIN IN SODIUM CHLORIDE 3-0.9 GM/100ML-% IV SOLN
3.0000 g | INTRAVENOUS | Status: AC
  Administered 2023-01-15: 2 g via INTRAVENOUS

## 2023-01-15 MED ORDER — HYDROCHLOROTHIAZIDE 12.5 MG PO TABS
12.5000 mg | ORAL_TABLET | Freq: Every day | ORAL | Status: DC
  Administered 2023-01-16: 12.5 mg via ORAL
  Filled 2023-01-15: qty 1

## 2023-01-15 MED ORDER — SODIUM CHLORIDE 0.9 % IR SOLN
Status: DC | PRN
  Administered 2023-01-15: 3000 mL

## 2023-01-15 MED ORDER — TRANEXAMIC ACID-NACL 1000-0.7 MG/100ML-% IV SOLN
1000.0000 mg | Freq: Once | INTRAVENOUS | Status: AC
  Administered 2023-01-15: 1000 mg via INTRAVENOUS
  Filled 2023-01-15: qty 100

## 2023-01-15 MED ORDER — CELECOXIB 100 MG PO CAPS
ORAL_CAPSULE | ORAL | Status: AC
Start: 2023-01-15 — End: ?
  Filled 2023-01-15: qty 2

## 2023-01-15 MED ORDER — PROPOFOL 500 MG/50ML IV EMUL
INTRAVENOUS | Status: AC
  Filled 2023-01-15: qty 100

## 2023-01-15 MED ORDER — ONDANSETRON HCL 4 MG/2ML IJ SOLN: 4.0000 mg | Freq: Four times a day (QID) | INTRAMUSCULAR | Status: DC

## 2023-01-15 MED ORDER — BUPIVACAINE-MELOXICAM ER 200-6 MG/7ML IJ SOLN
INTRAMUSCULAR | Status: AC
  Filled 2023-01-15: qty 2

## 2023-01-15 SURGICAL SUPPLY — 66 items
ATTUNE MED DOME PAT 38 KNEE (Knees) IMPLANT
ATTUNE PS FEM RT SZ 6 CEM KNEE (Femur) IMPLANT
BANDAGE ESMARK 6X9 LF (GAUZE/BANDAGES/DRESSINGS) ×2 IMPLANT
BASEPLATE TIB CMT FB PCKT SZ6 (Knees) IMPLANT
BLADE SAGITTAL 25.0X1.27X90 (BLADE) ×2 IMPLANT
BLADE SAW SGTL 11.0X1.19X90.0M (BLADE) ×2 IMPLANT
BLADE SURG SZ10 CARB STEEL (BLADE) ×2 IMPLANT
BNDG ELASTIC 4X5.8 VLCR NS LF (GAUZE/BANDAGES/DRESSINGS) ×4 IMPLANT
BNDG ELASTIC 6X5.8 VLCR NS LF (GAUZE/BANDAGES/DRESSINGS) ×2 IMPLANT
BNDG ESMARK 6X9 LF (GAUZE/BANDAGES/DRESSINGS) ×1
CEMENT HV SMART SET (Cement) ×4 IMPLANT
CLOTH BEACON ORANGE TIMEOUT ST (SAFETY) ×2 IMPLANT
COOLER ICEMAN CLASSIC (MISCELLANEOUS) ×2 IMPLANT
COVER LIGHT HANDLE STERIS (MISCELLANEOUS) ×4 IMPLANT
CUFF TOURN SGL QUICK 34 (TOURNIQUET CUFF) ×1
CUFF TRNQT CYL 34X4.125X (TOURNIQUET CUFF) ×2 IMPLANT
DECANTER SPIKE VIAL GLASS SM (MISCELLANEOUS) IMPLANT
DRAPE BACK TABLE (DRAPES) ×2 IMPLANT
DRAPE EXTREMITY T 121X128X90 (DISPOSABLE) ×2 IMPLANT
DRESSING AQUACEL AG ADV 3.5X12 (MISCELLANEOUS) ×2 IMPLANT
DRSG AQUACEL AG ADV 3.5X12 (MISCELLANEOUS) ×1
DURAPREP 26ML APPLICATOR (WOUND CARE) ×4 IMPLANT
ELECT REM PT RETURN 9FT ADLT (ELECTROSURGICAL) ×1
ELECTRODE REM PT RTRN 9FT ADLT (ELECTROSURGICAL) ×2 IMPLANT
GLOVE BIO SURGEON STRL SZ7 (GLOVE) IMPLANT
GLOVE BIOGEL PI IND STRL 7.0 (GLOVE) ×12 IMPLANT
GLOVE BIOGEL PI IND STRL 8.5 (GLOVE) ×2 IMPLANT
GLOVE ECLIPSE 6.5 STRL STRAW (GLOVE) IMPLANT
GLOVE SKINSENSE STRL SZ8.0 LF (GLOVE) ×2 IMPLANT
GOWN STRL REUS W/TWL LRG LVL3 (GOWN DISPOSABLE) ×6 IMPLANT
GOWN STRL REUS W/TWL XL LVL3 (GOWN DISPOSABLE) ×2 IMPLANT
HANDPIECE INTERPULSE COAX TIP (DISPOSABLE) ×1
HOOD PEEL AWAY T7 (MISCELLANEOUS) ×8 IMPLANT
INSERT TIB PS FB ATTUNE SZ6X5 (Knees) IMPLANT
INST SET MAJOR BONE (KITS) ×2 IMPLANT
IV NS IRRIG 3000ML ARTHROMATIC (IV SOLUTION) ×2 IMPLANT
KIT BLADEGUARD II DBL (SET/KITS/TRAYS/PACK) ×2 IMPLANT
KIT TURNOVER KIT A (KITS) ×2 IMPLANT
MANIFOLD NEPTUNE II (INSTRUMENTS) ×2 IMPLANT
MARKER SKIN DUAL TIP RULER LAB (MISCELLANEOUS) ×2 IMPLANT
NDL HYPO 18GX1.5 BLUNT FILL (NEEDLE) IMPLANT
NDL HYPO 21X1.5 SAFETY (NEEDLE) IMPLANT
NEEDLE HYPO 18GX1.5 BLUNT FILL (NEEDLE)
NEEDLE HYPO 21X1.5 SAFETY (NEEDLE)
NS IRRIG 1000ML POUR BTL (IV SOLUTION) ×2 IMPLANT
PACK TOTAL JOINT (CUSTOM PROCEDURE TRAY) ×2 IMPLANT
PAD ARMBOARD 7.5X6 YLW CONV (MISCELLANEOUS) ×2 IMPLANT
PAD COLD SHLDR SM WRAP-ON (PAD) ×2 IMPLANT
PILLOW KNEE EXTENSION 0 DEG (MISCELLANEOUS) ×2 IMPLANT
PIN/DRILL PACK ORTHO 1/8X3.0 (PIN) ×2 IMPLANT
POSITIONER HEAD 8X9X4 ADT (SOFTGOODS) ×2 IMPLANT
SAW OSC TIP CART 19.5X105X1.3 (SAW) ×2 IMPLANT
SET BASIN LINEN APH (SET/KITS/TRAYS/PACK) ×2 IMPLANT
SET HNDPC FAN SPRY TIP SCT (DISPOSABLE) ×2 IMPLANT
SOLUTION IRRIG SURGIPHOR (IV SOLUTION) ×2 IMPLANT
SPONGE T-LAP 18X18 ~~LOC~~+RFID (SPONGE) IMPLANT
STAPLER SKIN PROX WIDE 3.9 (STAPLE) ×2 IMPLANT
SUT BRALON NAB BRD #1 30IN (SUTURE) ×2 IMPLANT
SUT MNCRL 0 VIOLET CTX 36 (SUTURE) ×2 IMPLANT
SUT MON AB 0 CT1 (SUTURE) ×2 IMPLANT
SYR BULB IRRIG 60ML STRL (SYRINGE) ×2 IMPLANT
TOWEL OR 17X26 4PK STRL BLUE (TOWEL DISPOSABLE) ×2 IMPLANT
TOWER CARTRIDGE SMART MIX (DISPOSABLE) ×2 IMPLANT
TRAY FOLEY MTR SLVR 16FR STAT (SET/KITS/TRAYS/PACK) ×2 IMPLANT
WATER STERILE IRR 1000ML POUR (IV SOLUTION) ×4 IMPLANT
YANKAUER SUCT 12FT TUBE ARGYLE (SUCTIONS) ×2 IMPLANT

## 2023-01-15 NOTE — Brief Op Note (Signed)
01/15/2023  9:52 AM  PATIENT:  Ethan Oliver  67 y.o. male  PRE-OPERATIVE DIAGNOSIS:  right knee osteoarthritis  POST-OPERATIVE DIAGNOSIS:  right knee osteoarthritis  PROCEDURE:  Procedure(s): TOTAL KNEE ARTHROPLASTY (Right)  SURGEON:  Surgeons and Role:    Vickki Hearing, MD - Primary  PHYSICIAN ASSISTANT:   ASSISTANTS:  Romie Levee and Canary Brim  ANESTHESIA:   general and postoperatively saphenous nerve block in PACU  EBL:  none   BLOOD ADMINISTERED:none  DRAINS: none   LOCAL MEDICATIONS USED:  OTHER zinrelef  SPECIMEN:  No Specimen  DISPOSITION OF SPECIMEN:  N/A  COUNTS:  YES  TOURNIQUET:   Total Tourniquet Time Documented: Thigh (Right) - 90 minutes Total: Thigh (Right) - 90 minutes   DICTATION: .Reubin Milan Dictation  PLAN OF CARE: Admit for overnight observation  PATIENT DISPOSITION:  PACU - hemodynamically stable.   Delay start of Pharmacological VTE agent (>24hrs) due to surgical blood loss or risk of bleeding: not applicable

## 2023-01-15 NOTE — Interval H&P Note (Signed)
History and Physical Interval Note:  01/15/2023 7:17 AM  Ethan Oliver  has presented today for surgery, with the diagnosis of right knee osteoarthritis.  The various methods of treatment have been discussed with the patient and family. After consideration of risks, benefits and other options for treatment, the patient has consented to  Procedure(s): TOTAL KNEE ARTHROPLASTY (Right) as a surgical intervention.  The patient's history has been reviewed, patient examined, no change in status, stable for surgery.  I have reviewed the patient's chart and labs.  Questions were answered to the patient's satisfaction.     Fuller Canada

## 2023-01-15 NOTE — Evaluation (Signed)
Physical Therapy Evaluation Patient Details Name: Ethan Oliver MRN: [id number] DOB: May 16, 1955 Today's Date: 01/15/2023   RIGHT KNEE ROM: 0 - 98 degrees AMBULATION DISTANCE: 65 feet using RW with Supervision   History of Present Illness  Ethan Oliver is a 67 y/o male, s/p Right TKA on 11/06//24, with the diagnosis of right knee osteoarthritis.  Clinical Impression  Patient demonstrates labored movement for sitting up at bedside with good return for moving RLE, reviewed in HEP with good carryover, ambulated in hallway using RW without loss of balance and tolerated sitting up in chair with RLE dangling after therapy with his spouse present.  Patient will benefit from continued skilled physical therapy in hospital and recommended venue below to increase strength, balance, endurance for safe ADLs and gait.         If plan is discharge home, recommend the following: A little help with walking and/or transfers;A little help with bathing/dressing/bathroom;Help with stairs or ramp for entrance;Assistance with cooking/housework   Can travel by private vehicle        Equipment Recommendations None recommended by PT  Recommendations for Other Services       Functional Status Assessment Patient has had a recent decline in their functional status and demonstrates the ability to make significant improvements in function in a reasonable and predictable amount of time.     Precautions / Restrictions Precautions Precautions: Fall Restrictions Weight Bearing Restrictions: Yes RLE Weight Bearing: Weight bearing as tolerated      Mobility  Bed Mobility Overal bed mobility: Needs Assistance Bed Mobility: Supine to Sit     Supine to sit: Supervision     General bed mobility comments: required use of bed rails with HOB slightly raised    Transfers Overall transfer level: Needs assistance Equipment used: Rolling walker (2 wheels) Transfers: Sit to/from Stand, Bed to  chair/wheelchair/BSC Sit to Stand: Supervision   Step pivot transfers: Supervision       General transfer comment: increased time, labored movement    Ambulation/Gait Ambulation/Gait assistance: Supervision Gait Distance (Feet): 65 Feet Assistive device: Rolling walker (2 wheels) Gait Pattern/deviations: Decreased step length - right, Decreased step length - left, Decreased stride length Gait velocity: decreased     General Gait Details: slow slightly labored cadence with fair/good return for right heel to toe stepping without lsos of balance, limite mostly due to c/o fatigue  Stairs            Wheelchair Mobility     Tilt Bed    Modified Rankin (Stroke Patients Only)       Balance Overall balance assessment: Needs assistance Sitting-balance support: Feet supported, No upper extremity supported Sitting balance-Leahy Scale: Good Sitting balance - Comments: seated at EOB   Standing balance support: During functional activity, Reliant on assistive device for balance, Bilateral upper extremity supported Standing balance-Leahy Scale: Fair Standing balance comment: fair/good using RW                             Pertinent Vitals/Pain Pain Assessment Pain Assessment: Faces Faces Pain Scale: Hurts little more Pain Location: right knee Pain Descriptors / Indicators: Sore, Discomfort Pain Intervention(s): Limited activity within patient's tolerance, Monitored during session, Repositioned, Premedicated before session    Home Living Family/patient expects to be discharged to:: Private residence Living Arrangements: Spouse/significant other Available Help at Discharge: Family;Available 24 hours/day Type of Home: House Home Access: Stairs to enter Entrance Stairs-Rails: None Entrance [employer]  of Steps: 2 Alternate Level Stairs-Number of Steps: 13 Home Layout: Two level;Able to live on main level with bedroom/bathroom;Full bath on main level Home  Equipment: Rolling Walker (2 wheels);Shower seat;Cane - single point      Prior Function Prior Level of Function : Independent/Modified Independent             Mobility Comments: Community ambulator without AD, drives ADLs Comments: Independent     Extremity/Trunk Assessment   Upper Extremity Assessment Upper Extremity Assessment: Overall WFL for tasks assessed    Lower Extremity Assessment Lower Extremity Assessment: Generalized weakness;RLE deficits/detail RLE Deficits / Details: grossly -4/5 RLE: Unable to fully assess due to pain RLE Sensation: WNL RLE Coordination: WNL    Cervical / Trunk Assessment Cervical / Trunk Assessment: Normal  Communication   Communication Communication: No apparent difficulties  Cognition Arousal: Alert Behavior During Therapy: WFL for tasks assessed/performed Overall Cognitive Status: Within Functional Limits for tasks assessed                                          General Comments      Exercises Total Joint Exercises Ankle Circles/Pumps: Supine, 10 reps, Right, AROM, Strengthening Quad Sets: AROM, Right, 10 reps, Supine, Strengthening Short Arc Quad: AROM, Strengthening, Right, 10 reps, Supine Heel Slides: AROM, Strengthening, Right, 10 reps, Supine Goniometric ROM: Right knee 0 - 98 degrees   Assessment/Plan    PT Assessment Patient needs continued PT services  PT Problem List Decreased strength;Decreased activity tolerance;Decreased balance;Decreased mobility;Decreased range of motion;Pain       PT Treatment Interventions DME instruction;Gait training;Stair training;Functional mobility training;Therapeutic activities;Therapeutic exercise;Balance training;Patient/family education    PT Goals (Current goals can be found in the Care Plan section)  Acute Rehab PT Goals Patient Stated Goal: return home with family to assist PT Goal Formulation: With patient/family Time For Goal Achievement:  01/17/23 Potential to Achieve Goals: Good    Frequency BID     Co-evaluation               AM-PAC PT "6 Clicks" Mobility  Outcome Measure Help needed turning from your back to your side while in a flat bed without using bedrails?: A Little Help needed moving from lying on your back to sitting on the side of a flat bed without using bedrails?: A Little Help needed moving to and from a bed to a chair (including a wheelchair)?: A Little Help needed standing up from a chair using your arms (e.g., wheelchair or bedside chair)?: A Little Help needed to walk in hospital room?: A Little Help needed climbing 3-5 steps with a railing? : A Little 6 Click Score: 18    End of Session   Activity Tolerance: Patient tolerated treatment well;Patient limited by fatigue Patient left: in chair;with call bell/phone within reach;with family/visitor present Nurse Communication: Mobility status PT Visit Diagnosis: Unsteadiness on feet (R26.81);Other abnormalities of gait and mobility (R26.89);Muscle weakness (generalized) (M62.81)    Time: 2952-8413 PT Time Calculation (min) (ACUTE ONLY): 30 min   Charges:   PT Evaluation $PT Eval Moderate Complexity: 1 Mod PT Treatments $Therapeutic Activity: 23-37 mins PT General Charges $$ ACUTE PT VISIT: 1 Visit         3:30 PM, 01/15/23 Ocie Bob, MPT Physical Therapist with [facility] [phone] office [phone] mobile phone

## 2023-01-15 NOTE — Anesthesia Preprocedure Evaluation (Signed)
Anesthesia Evaluation  Patient identified by MRN, date of birth, ID band Patient awake    Reviewed: Allergy & Precautions, H&P , NPO status , Patient's Chart, lab work & pertinent test results, reviewed documented beta blocker date and time   Airway Mallampati: II  TM Distance: >3 FB Neck ROM: full    Dental no notable dental hx.    Pulmonary neg pulmonary ROS   Pulmonary exam normal breath sounds clear to auscultation       Cardiovascular Exercise Tolerance: Good hypertension, negative cardio ROS  Rhythm:regular Rate:Normal     Neuro/Psych  PSYCHIATRIC DISORDERS  Depression     Neuromuscular disease negative neurological ROS  negative psych ROS   GI/Hepatic negative GI ROS, Neg liver ROS,,,  Endo/Other  negative endocrine ROSdiabetes    Renal/GU negative Renal ROS  negative genitourinary   Musculoskeletal   Abdominal   Peds  Hematology negative hematology ROS (+)   Anesthesia Other Findings   Reproductive/Obstetrics negative OB ROS                             Anesthesia Physical Anesthesia Plan  ASA: 2  Anesthesia Plan: Spinal   Post-op Pain Management: Regional block*   Induction:   PONV Risk Score and Plan: Propofol infusion  Airway Management Planned:   Additional Equipment:   Intra-op Plan:   Post-operative Plan:   Informed Consent: I have reviewed the patients History and Physical, chart, labs and discussed the procedure including the risks, benefits and alternatives for the proposed anesthesia with the patient or authorized representative who has indicated his/her understanding and acceptance.     Dental Advisory Given  Plan Discussed with: CRNA  Anesthesia Plan Comments:        Anesthesia Quick Evaluation

## 2023-01-15 NOTE — Transfer of Care (Signed)
Immediate Anesthesia Transfer of Care Note  Patient: Ethan Oliver  Procedure(s) Performed: TOTAL KNEE ARTHROPLASTY (Right: Knee)  Patient Location: PACU  Anesthesia Type:General  Level of Consciousness: awake and alert   Airway & Oxygen Therapy: Patient Spontanous Breathing and Patient connected to face mask oxygen  Post-op Assessment: Report given to RN and Post -op Vital signs reviewed and stable  Post vital signs: Reviewed and stable  Last Vitals:  Vitals Value Taken Time  BP 135/79 01/15/23 1003  Temp 36.6 C 01/15/23 1002  Pulse 82 01/15/23 1004  Resp 23 01/15/23 1005  SpO2 93 % 01/15/23 1004  Vitals shown include unfiled device data.  Last Pain:  Vitals:   01/15/23 0645  PainSc: 4          Complications: No notable events documented.

## 2023-01-15 NOTE — Anesthesia Procedure Notes (Signed)
Procedure Name: Intubation Date/Time: 01/15/2023 8:22 AM  Performed by: Deri Fuelling, CRNAPre-anesthesia Checklist: Patient identified, Emergency Drugs available, Suction available and Patient being monitored Patient Re-evaluated:Patient Re-evaluated prior to induction Oxygen Delivery Method: Circle system utilized Preoxygenation: Pre-oxygenation with 100% oxygen Induction Type: IV induction Ventilation: Mask ventilation without difficulty Laryngoscope Size: Glidescope and 4 Grade View: Grade I Tube type: Oral Tube size: 7.5 mm Number of attempts: 1 Airway Equipment and Method: Stylet and Oral airway Placement Confirmation: ETT inserted through vocal cords under direct vision, positive ETCO2 and breath sounds checked- equal and bilateral Secured at: 22 cm Tube secured with: Tape Dental Injury: Teeth and Oropharynx as per pre-operative assessment

## 2023-01-15 NOTE — Plan of Care (Signed)
  Problem: Acute Rehab PT Goals(only PT should resolve) Goal: Pt Will Go Supine/Side To Sit Outcome: Progressing Flowsheets (Taken 01/15/2023 1531) Pt will go Supine/Side to Sit:  with modified independence  with supervision Goal: Patient Will Transfer Sit To/From Stand Outcome: Progressing Flowsheets (Taken 01/15/2023 1531) Patient will transfer sit to/from stand: with modified independence Goal: Pt Will Transfer Bed To Chair/Chair To Bed Outcome: Progressing Flowsheets (Taken 01/15/2023 1531) Pt will Transfer Bed to Chair/Chair to Bed: with modified independence Goal: Pt Will Ambulate Outcome: Progressing Flowsheets (Taken 01/15/2023 1531) Pt will Ambulate:  > 125 feet  with modified independence  with rolling walker   3:31 PM, 01/15/23 Ocie Bob, MPT Physical Therapist with [facility] [phone] office [phone] mobile phone

## 2023-01-15 NOTE — Op Note (Signed)
Dictation for total knee replacement  01/15/2023  9:54 AM  Orthopaedic Surgery Operative Note (CSN: [id number])  Quincy Carnes  Oct 13, 1955 Date of Surgery: 01/15/2023   01/15/2023  9:52 AM  PATIENT:  Quincy Carnes  67 y.o. male  PRE-OPERATIVE DIAGNOSIS:  right knee osteoarthritis  POST-OPERATIVE DIAGNOSIS:  right knee osteoarthritis  PROCEDURE:  Procedure(s): TOTAL KNEE ARTHROPLASTY (Right)  SURGEON:  Surgeons and Role:    Vickki Hearing, MD - Primary  PHYSICIAN ASSISTANT:   ASSISTANTS:  Romie Levee and Canary Brim  ANESTHESIA:   general and postoperatively saphenous nerve block in PACU  EBL:  none   BLOOD ADMINISTERED:none  DRAINS: none   LOCAL MEDICATIONS USED:  OTHER zinrelef  SPECIMEN:  No Specimen  DISPOSITION OF SPECIMEN:  N/A  COUNTS:  YES  TOURNIQUET:   Total Tourniquet Time Documented: Thigh (Right) - 90 minutes Total: Thigh (Right) - 90 minutes   DICTATION: .Reubin Milan Dictation  PLAN OF CARE: Admit for overnight observation  PATIENT DISPOSITION:  PACU - hemodynamically stable.   Delay start of Pharmacological VTE agent (>24hrs) due to surgical blood loss or risk of bleeding: not applicable   Implants: Implant Name Type Inv. Item Serial No. Manufacturer Lot No. LRB No. Used Action  CEMENT HV SMART SET - [id number] Cement CEMENT HV SMART SET  [employer] [id number] Right 1 Implanted  CEMENT HV SMART SET - [id number] Cement CEMENT HV SMART SET  [employer]PU[employer] [id number] Right 1 Implanted  BSPLAT TIB 6 CMNT FXBRNG STRL - [id number] Knees BSPLAT TIB 6 CMNT FXBRNG STRL  [employer] [id number] Right 1 Implanted  ATTUNE PS FEM RT SZ 6 CEM KNEE - [id number] Femur ATTUNE PS FEM RT SZ 6 CEM KNEE  [employer] [id number] Right 1 Implanted  INSERT TIB PS FB ATTUNE SZ6X5 - [id number] Knees INSERT TIB PS FB ATTUNE SZ6X5  [employer]PU[employer]TH[employer] [id number] Right 1 Implanted  ATTUNE MED DOME PAT 38 KNEE - [id number] Knees ATTUNE MED DOME PAT 38  KNEE  [employer] [id number] Right 1 Implanted   Bone cuts cuts 9 distal femur / 9 lateral tibia  Indications for Surgery:   VITTORIO MOHS is a 67 y.o. male presented to [place] with disabling pain in his right knee he failed all nonoperative measures and opted for surgical intervention right total knee arthroplasty  While all risks cannot be anticipated, specific risks including infection, need for additional surgery, stiffness, postop pain, infection, implant removal, loosening, infection requiring amputation, deep vein thrombosis, pulmonary embolus were discussed.   Procedure:     Details of surgery: The patient was identified by 2 approved identification mechanisms. The operative extremity was evaluated and found to be acceptable for surgical treatment today. The chart was reviewed. The surgical site was confirmed and marked. The patient had a saphenous nerve block PACU after surgery  The patient was taken to the operating room and given appropriate antibiotic Ancef. This is consistent with the SCIP protocol.  The patient was given the following anesthetic: General  The patient was then placed supine on the operating table. A Foley catheter was inserted. The operative extremity was prepped and draped sterilely from the toes to the groin.  Timeout was executed confirming the patient's name, surgical site, antibiotic administration, x-rays available, and implants available.  The operative limb,  was exsanguinated with a six-inch Esmarch and the tourniquet was inflated to 300 mmHg.  A straight midline incision was made over the right KNEE and taken down to the extensor  mechanism. A medial arthrotomy was performed. The patella was everted and the patellofemoral soft tissue was released, along with the patellar fat pad.  The anterior cruciate ligament and PCL were resected.  The anterior horns of the lateral and medial meniscus were resected. The medial soft tissue sleeve was elevated to  the mid coronal plane.  A three-eighths inch drill bit was used to enter the femoral canal which was decompressed with suction and irrigation until clear.   The distal femoral cutting guide was set for 9 mm distal resection,  5valgus alignment, for a right knee. The distal femur was resected and checked for flatness.  The attune sizing femoral guide was placed and the femur was preliminarily sized to a size 6.   The external alignment guide for the tibial resection was then applied to the distal and proximal tibia and set for anatomic slope along with 9 MM resection  from the lateral side.  Rotational alignment was set using the malleolus, the tibial tubercle and the tibial spines.  The proximal tibia was resected along with  residual menisci. The tibia was sized using a base plate to a size 6.   The extension gap was checked.  5 mm spacer block balance the knee in extension   A 4-in-1 cutting block was placed along with collateral ligament retractors.  Our target was proper rotation based on the epicondylar axis using Whitesides line as a guide as well.  Once the block was pinned in place we took the spacer block that balanced extension gap size 5 and placed it on top of the tibia under the femoral block.  No additional adjustments were needed for rotation and the block was left in its normal position without having to move it down.   Once I was satisfied with the spacer block collateral ligament retractors were placed and I completed the 4 distal femoral cuts   The extension gap and flexion gap was rechecked with the same spacer block which was a size 5   The correct sized notch cutting guide for the femur was then applied and the notch cut was made.  Trial reduction was completed using size 6 femur 6 tibia 5 polyethylene insert trial implants. Patella tracking was normal  We then skeletonized the patella. It measured 25 in thickness and the patellar resection was set for 9.5 using  the gun resector guide . the patellar resection was completed. The patella diameter measured 38. We then drilled the peg holes for the patella.  The proximal tibia was prepared using the size 6 base plate.  Thorough irrigation was performed using saline  and the bone was dried and prepared for cement. The cement was mixed on the back table using third generation preparation techniques  The implants were then cemented in place and excess cement was removed. The cement was allowed to cure. Surgipor irrigation was placed followed by saline irrigation  Any excess bone fragments and cement was removed.  The extensor mechanism was closed with #1 Bralon suture followed by subcutaneous tissue closure using 0 Monocryl suture in 2 layers  Zynrelef a total of 2 vials were injected prior to complete extensor mechanism closure.  The openings and the capsule were then closed with #1 Braylon   Skin approximation was performed using staples  A sterile dressing was applied, TED hose were placed on the operative extremity followed by Cryo/Cuff.  The patient was taken recovery room in stable condition  Postop plan: Weightbearing as tolerated CPM machine  Immediate physical therapy Discharge tomorrow if stable

## 2023-01-15 NOTE — Progress Notes (Signed)
Instructed on incentive spirometer. 2500 ml obtained. Tolerated well. 

## 2023-01-16 ENCOUNTER — Other Ambulatory Visit: Payer: Self-pay | Admitting: Orthopedic Surgery

## 2023-01-16 ENCOUNTER — Telehealth: Payer: Self-pay | Admitting: Orthopedic Surgery

## 2023-01-16 DIAGNOSIS — M1711 Unilateral primary osteoarthritis, right knee: Secondary | ICD-10-CM | POA: Diagnosis not present

## 2023-01-16 LAB — BPAM RBC
Blood Product Expiration Date: 202412012359
Blood Product Expiration Date: 202412012359
ISSUE DATE / TIME: 202411051346
Unit Type and Rh: 5100
Unit Type and Rh: 5100

## 2023-01-16 LAB — BASIC METABOLIC PANEL
Anion gap: 9 (ref 5–15)
BUN: 18 mg/dL (ref 8–23)
CO2: 24 mmol/L (ref 22–32)
Calcium: 8.5 mg/dL — ABNORMAL LOW (ref 8.9–10.3)
Chloride: 98 mmol/L (ref 98–111)
Creatinine, Ser: 0.9 mg/dL (ref 0.61–1.24)
GFR, Estimated: >60 mL/min (ref >60)
Glucose, Bld: 224 mg/dL — ABNORMAL HIGH (ref 70–99)
Potassium: 4.2 mmol/L (ref 3.5–5.1)
Sodium: 131 mmol/L — ABNORMAL LOW (ref 135–145)

## 2023-01-16 LAB — TYPE AND SCREEN
ABO/RH(D): O POS
Antibody Screen: NEGATIVE
Unit division: 0
Unit division: 0

## 2023-01-16 LAB — CBC
HCT: 42.1 % (ref 39.0–52.0)
Hemoglobin: 13.6 g/dL (ref 13.0–17.0)
MCH: 29.8 pg (ref 26.0–34.0)
MCHC: 32.3 g/dL (ref 30.0–36.0)
MCV: 92.1 fL (ref 80.0–100.0)
Platelets: 170 10*3/uL (ref 150–400)
RBC: 4.57 MIL/uL (ref 4.22–5.81)
RDW: 13.2 % (ref 11.5–15.5)
WBC: 8.4 10*3/uL (ref 4.0–10.5)
nRBC: 0 % (ref 0.0–0.2)

## 2023-01-16 LAB — GLUCOSE, CAPILLARY: Glucose-Capillary: 208 mg/dL — ABNORMAL HIGH (ref 70–99)

## 2023-01-16 MED ORDER — TRAMADOL HCL 50 MG PO TABS: 50.0000 mg | ORAL_TABLET | Freq: Four times a day (QID) | ORAL | 0 refills | Status: DC | PRN

## 2023-01-16 MED ORDER — POLYETHYLENE GLYCOL 3350 17 G PO PACK: 17.0000 g | PACK | Freq: Every day | ORAL | 0 refills | Status: DC | PRN

## 2023-01-16 MED ORDER — DOCUSATE SODIUM 100 MG PO CAPS: 100.0000 mg | ORAL_CAPSULE | Freq: Two times a day (BID) | ORAL | 0 refills | Status: DC

## 2023-01-16 MED ORDER — OXYCODONE-ACETAMINOPHEN 5-325 MG PO TABS: 1.0000 | ORAL_TABLET | ORAL | 0 refills | Status: DC | PRN

## 2023-01-16 MED ORDER — ASPIRIN 81 MG PO CHEW: 81.0000 mg | CHEWABLE_TABLET | Freq: Two times a day (BID) | ORAL | 0 refills | Status: DC

## 2023-01-16 MED ORDER — METHOCARBAMOL 500 MG PO TABS: 500.0000 mg | ORAL_TABLET | Freq: Four times a day (QID) | ORAL | 2 refills | Status: DC | PRN

## 2023-01-16 NOTE — Progress Notes (Signed)
Nsg Discharge Note  Admit Date:  01/15/2023 Discharge date: 01/16/2023   Quincy Carnes to be D/C'd Home per MD order.  AVS completed.  Copy for chart, and copy for patient signed, and dated. Patient/caregiver able to verbalize understanding.  Discharge Medication: Allergies as of 01/16/2023       Reactions   Codeine Nausea Only        Medication List     TAKE these medications    aspirin 81 MG chewable tablet Chew 1 tablet (81 mg total) by mouth 2 (two) times daily.   celecoxib 100 MG capsule Commonly known as: CELEBREX Take 100 mg by mouth in the morning.   citalopram 20 MG tablet Commonly known as: CELEXA Take 20 mg by mouth in the morning.   Commode 3-In-1 Misc Weight up to 350 lbs 3 in 1 commode   docusate sodium 100 MG capsule Commonly known as: COLACE Take 1 capsule (100 mg total) by mouth 2 (two) times daily.   glipiZIDE 10 MG 24 hr tablet Commonly known as: GLUCOTROL XL Take 10 mg by mouth every evening.   hydrochlorothiazide 12.5 MG tablet Commonly known as: HYDRODIURIL Take 12.5 mg by mouth daily.   lisinopril 20 MG tablet Commonly known as: ZESTRIL Take 20 mg by mouth in the morning.   metFORMIN 750 MG 24 hr tablet Commonly known as: GLUCOPHAGE-XR Take 750 mg by mouth every morning.   methocarbamol 500 MG tablet Commonly known as: ROBAXIN Take 1 tablet (500 mg total) by mouth every 6 (six) hours as needed for muscle spasms.   oxyCODONE-acetaminophen 5-325 MG tablet Commonly known as: Percocet Take 1 tablet by mouth every 4 (four) hours as needed for severe pain (pain score 7-10).   Ozempic (0.25 or 0.5 MG/DOSE) 2 MG/3ML Sopn Generic drug: Semaglutide(0.25 or 0.5MG /DOS) Inject 0.5 mg into the skin every Sunday.   polyethylene glycol 17 g packet Commonly known as: MIRALAX / GLYCOLAX Take 17 g by mouth daily as needed for mild constipation.   pravastatin 40 MG tablet Commonly known as: PRAVACHOL Take 40 mg by mouth in the morning.    traMADol 50 MG tablet Commonly known as: Ultram Take 1 tablet (50 mg total) by mouth every 6 (six) hours as needed.        Discharge Assessment: Vitals:   01/15/23 1850 01/16/23 0433  BP: 139/76 115/83  Pulse: 85 67  Resp: 18 18  Temp: 98.4 F (36.9 C) 98.6 F (37 C)  SpO2: 95% 95%   Skin clean, dry and intact without evidence of skin break down, no evidence of skin tears noted. IV catheter discontinued intact. Site without signs and symptoms of complications - no redness or edema noted at insertion site, patient denies c/o pain - only slight tenderness at site.  Dressing with slight pressure applied.  D/c Instructions-Education: Discharge instructions given to patient/family with verbalized understanding. D/c education completed with patient/family including follow up instructions, medication list, d/c activities limitations if indicated, with other d/c instructions as indicated by MD - patient able to verbalize understanding, all questions fully answered. Patient instructed to return to ED, call 911, or call MD for any changes in condition.  Patient escorted via WC, and D/C home via private auto.  Demetrio Lapping, LPN 16/03/958 45:40 AM

## 2023-01-16 NOTE — Progress Notes (Signed)
Physical Therapy Treatment Patient Details Name: Ethan Oliver MRN: [id number] DOB: 08-15-1955 Today's Date: 01/16/2023   RIGHT KNEE ROM: 0 - 118 degrees AMBULATION DISTANCE: 200 feet using RW with Modified Independence   History of Present Illness Ethan Oliver is a 67 y/o male, s/p Right TKA on 11/06//24, with the diagnosis of right knee osteoarthritis.    PT Comments  Patient demonstrates increased endurance/distance for gait training, good return for for going up/down steps in stairwell without loss of balance and understanding acknowledged.  Patient tolerated sitting up in chair after therapy with RLE dangling.  Patient will benefit from continued skilled physical therapy in hospital and recommended venue below to increase strength, balance, endurance for safe ADLs and gait.      If plan is discharge home, recommend the following: A little help with walking and/or transfers;A little help with bathing/dressing/bathroom;Help with stairs or ramp for entrance;Assistance with cooking/housework   Can travel by private vehicle        Equipment Recommendations  None recommended by PT    Recommendations for Other Services       Precautions / Restrictions Precautions Precautions: Fall Restrictions Weight Bearing Restrictions: Yes RLE Weight Bearing: Weight bearing as tolerated     Mobility  Bed Mobility Overal bed mobility: Modified Independent, Independent             General bed mobility comments: good return for getting into/out of bed, moving RLE    Transfers Overall transfer level: Modified independent                 General transfer comment: good return for sit to stands/transfers without problem    Ambulation/Gait Ambulation/Gait assistance: Modified independent (Device/Increase time) Gait Distance (Feet): 200 Feet Assistive device: Rolling walker (2 wheels) Gait Pattern/deviations: Decreased step length - right, Decreased step length - left,  Decreased stride length Gait velocity: decreased     General Gait Details: increased endurance/distance for gait training with good return for right heel to toe/stepping without loss of balance   Stairs Stairs: Yes Stairs assistance: Modified independent (Device/Increase time) Stair Management: Step to pattern, Two rails Number of Stairs: 10 General stair comments: good return for going up/down steps in stair using Bilateral side rails without loss of balance and understanding acknoweldged   Wheelchair Mobility     Tilt Bed    Modified Rankin (Stroke Patients Only)       Balance Overall balance assessment: Needs assistance Sitting-balance support: Feet supported, No upper extremity supported Sitting balance-Leahy Scale: Good Sitting balance - Comments: seated at EOB   Standing balance support: During functional activity, Reliant on assistive device for balance, Bilateral upper extremity supported Standing balance-Leahy Scale: Good Standing balance comment: using RW                            Cognition Arousal: Alert Behavior During Therapy: WFL for tasks assessed/performed Overall Cognitive Status: Within Functional Limits for tasks assessed                                          Exercises Total Joint Exercises Ankle Circles/Pumps: Supine, Right, AROM, Strengthening, 5 reps Heel Slides: AROM, Strengthening, Right, Supine, 5 reps Goniometric ROM: Right knee ROM: 0 - 118 degrees    General Comments        Pertinent Vitals/Pain Pain  Assessment Pain Assessment: 0-10 Pain Score: 6  Pain Location: right knee Pain Descriptors / Indicators: Sore, Discomfort Pain Intervention(s): Limited activity within patient's tolerance, Monitored during session, Repositioned    Home Living                          Prior Function            PT Goals (current goals can now be found in the care plan section) Acute Rehab PT  Goals Patient Stated Goal: return home with family to assist PT Goal Formulation: With patient/family Time For Goal Achievement: 01/17/23 Potential to Achieve Goals: Good Progress towards PT goals: Progressing toward goals    Frequency    BID      PT Plan      Co-evaluation              AM-PAC PT "6 Clicks" Mobility   Outcome Measure  Help needed turning from your back to your side while in a flat bed without using bedrails?: None Help needed moving from lying on your back to sitting on the side of a flat bed without using bedrails?: None Help needed moving to and from a bed to a chair (including a wheelchair)?: None Help needed standing up from a chair using your arms (e.g., wheelchair or bedside chair)?: None Help needed to walk in hospital room?: A Little Help needed climbing 3-5 steps with a railing? : A Little 6 Click Score: 22    End of Session Equipment Utilized During Treatment: Gait belt Activity Tolerance: Patient tolerated treatment well;Patient limited by fatigue Patient left: in chair;with call bell/phone within reach;with family/visitor present Nurse Communication: Mobility status PT Visit Diagnosis: Unsteadiness on feet (R26.81);Other abnormalities of gait and mobility (R26.89);Muscle weakness (generalized) (M62.81)     Time: 7253-6644 PT Time Calculation (min) (ACUTE ONLY): 20 min  Charges:    $Gait Training: 8-22 mins $Therapeutic Activity: 8-22 mins PT General Charges $$ ACUTE PT VISIT: 1 Visit                     10:01 AM, 01/16/23 Ocie Bob, MPT Physical Therapist with [facility] [phone] office [phone] mobile phone

## 2023-01-16 NOTE — Progress Notes (Signed)
Patient ID: Ethan Oliver, male   DOB: Feb 10, 1956, 67 y.o.   MRN: [id number] Postop day 1 right total knee  BP 115/83 (BP Location: Right Arm)   Pulse 67   Temp 98.6 F (37 C) (Oral)   Resp 18   SpO2 95%      Latest Ref Rng & Units 01/16/2023    4:30 AM 01/08/2023    9:06 AM 04/24/2022    4:08 AM  CBC  WBC 4.0 - 10.5 K/uL 8.4  4.6  6.0   Hemoglobin 13.0 - 17.0 g/dL 40.9  81.1  91.4   Hematocrit 39.0 - 52.0 % 42.1  48.0  40.0   Platelets 150 - 400 K/uL 170  178  151        Latest Ref Rng & Units 01/16/2023    4:30 AM 01/08/2023    9:06 AM 04/24/2022    4:08 AM  BMP  Glucose 70 - 99 mg/dL 782  956  213   BUN 8 - 23 mg/dL 18  18  14    Creatinine 0.61 - 1.24 mg/dL 0.86  5.78  4.69   Sodium 135 - 145 mmol/L 131  134  134   Potassium 3.5 - 5.1 mmol/L 4.2  4.0  3.6   Chloride 98 - 111 mmol/L 98  101  100   CO2 22 - 32 mmol/L 24  24  27    Calcium 8.9 - 10.3 mg/dL 8.5  9.5  8.2     [name] looks good he tolerated therapy well he will try to get on the stairs today and discharge later Foley is already out everything going well

## 2023-01-16 NOTE — Telephone Encounter (Signed)
I called pharmacy to cancel the tramadol  He will use the oxycodone

## 2023-01-16 NOTE — Discharge Summary (Signed)
Physician Discharge Summary  Patient ID: Ethan Oliver MRN: [id number] DOB/AGE: 10-11-1955 67 y.o.  Admit date: 01/15/2023 Discharge date: 01/16/2023  Admission Diagnoses: Osteoarthritis right knee  Discharge Diagnoses:  Principal Problem:   Osteoarthritis of right knee Active Problems:   Primary osteoarthritis of right knee   Discharged Condition: good  Hospital Course: Excellent       Latest Ref Rng & Units 01/16/2023    4:30 AM 01/08/2023    9:06 AM 04/24/2022    4:08 AM  CBC  WBC 4.0 - 10.5 K/uL 8.4  4.6  6.0   Hemoglobin 13.0 - 17.0 g/dL 95.2  84.1  32.4   Hematocrit 39.0 - 52.0 % 42.1  48.0  40.0   Platelets 150 - 400 K/uL 170  178  151        Latest Ref Rng & Units 01/16/2023    4:30 AM 01/08/2023    9:06 AM 04/24/2022    4:08 AM  BMP  Glucose 70 - 99 mg/dL 401  027  253   BUN 8 - 23 mg/dL 18  18  14    Creatinine 0.61 - 1.24 mg/dL 6.64  4.03  4.74   Sodium 135 - 145 mmol/L 131  134  134   Potassium 3.5 - 5.1 mmol/L 4.2  4.0  3.6   Chloride 98 - 111 mmol/L 98  101  100   CO2 22 - 32 mmol/L 24  24  27    Calcium 8.9 - 10.3 mg/dL 8.5  9.5  8.2      Discharge Exam: Blood pressure 115/83, pulse 67, temperature 98.6 F (37 C), temperature source Oral, resp. rate 18, SpO2 95%. Doing well awake and alert neurovascular intact wound clean dry intact  Disposition: Discharge disposition: 01-Home or Self Care       Discharge Instructions     Call MD / Call 911   Complete by: As directed    If you experience chest pain or shortness of breath, CALL 911 and be transported to the hospital emergency room.  If you develope a fever above 101 F, pus (white drainage) or increased drainage or redness at the wound, or calf pain, call your surgeon's office.   Constipation Prevention   Complete by: As directed    Drink plenty of fluids.  Prune juice may be helpful.  You may use a stool softener, such as Colace (over the counter) 100 mg twice a day.  Use MiraLax (over the  counter) for constipation as needed.   Diet - low sodium heart healthy   Complete by: As directed    Discharge instructions   Complete by: As directed    Blue foam pillow 3 times a day for 30 minutes  CPM 4 hours a day start at 0-90 increase 10 degrees/day as tolerated  Ice is much as possible  Weightbearing status as tolerated  Dressing is waterproof  You do not have to change the dressing unless it gets soiled all the way through   Increase activity slowly as tolerated   Complete by: As directed    Post-operative opioid taper instructions:   Complete by: As directed    POST-OPERATIVE OPIOID TAPER INSTRUCTIONS: It is important to wean off of your opioid medication as soon as possible. If you do not need pain medication after your surgery it is ok to stop day one. Opioids include: Codeine, Hydrocodone(Norco, Vicodin), Oxycodone(Percocet, oxycontin) and hydromorphone amongst others.  Long term and even short term use of opiods  can cause: Increased pain response Dependence Constipation Depression Respiratory depression And more.  Withdrawal symptoms can include Flu like symptoms Nausea, vomiting And more Techniques to manage these symptoms Hydrate well Eat regular healthy meals Stay active Use relaxation techniques(deep breathing, meditating, yoga) Do Not substitute Alcohol to help with tapering If you have been on opioids for less than two weeks and do not have pain than it is ok to stop all together.  Plan to wean off of opioids This plan should start within one week post op of your joint replacement. Maintain the same interval or time between taking each dose and first decrease the dose.  Cut the total daily intake of opioids by one tablet each day Next start to increase the time between doses. The last dose that should be eliminated is the evening dose.         Allergies as of 01/16/2023       Reactions   Codeine Nausea Only        Medication List      TAKE these medications    aspirin 81 MG chewable tablet Chew 1 tablet (81 mg total) by mouth 2 (two) times daily.   celecoxib 100 MG capsule Commonly known as: CELEBREX Take 100 mg by mouth in the morning.   citalopram 20 MG tablet Commonly known as: CELEXA Take 20 mg by mouth in the morning.   Commode 3-In-1 Misc Weight up to 350 lbs 3 in 1 commode   docusate sodium 100 MG capsule Commonly known as: COLACE Take 1 capsule (100 mg total) by mouth 2 (two) times daily.   glipiZIDE 10 MG 24 hr tablet Commonly known as: GLUCOTROL XL Take 10 mg by mouth every evening.   hydrochlorothiazide 12.5 MG tablet Commonly known as: HYDRODIURIL Take 12.5 mg by mouth daily.   lisinopril 20 MG tablet Commonly known as: ZESTRIL Take 20 mg by mouth in the morning.   metFORMIN 750 MG 24 hr tablet Commonly known as: GLUCOPHAGE-XR Take 750 mg by mouth every morning.   methocarbamol 500 MG tablet Commonly known as: ROBAXIN Take 1 tablet (500 mg total) by mouth every 6 (six) hours as needed for muscle spasms.   oxyCODONE-acetaminophen 5-325 MG tablet Commonly known as: Percocet Take 1 tablet by mouth every 4 (four) hours as needed for severe pain (pain score 7-10).   Ozempic (0.25 or 0.5 MG/DOSE) 2 MG/3ML Sopn Generic drug: Semaglutide(0.25 or 0.5MG /DOS) Inject 0.5 mg into the skin every Sunday.   polyethylene glycol 17 g packet Commonly known as: MIRALAX / GLYCOLAX Take 17 g by mouth daily as needed for mild constipation.   pravastatin 40 MG tablet Commonly known as: PRAVACHOL Take 40 mg by mouth in the morning.   traMADol 50 MG tablet Commonly known as: Ultram Take 1 tablet (50 mg total) by mouth every 6 (six) hours as needed.        Follow-up Information     Vickki Hearing, MD Follow up on 01/30/2023.   Specialties: Orthopedic Surgery, Radiology Why: For wound re-check, For suture removal Contact information: [address] [place]  [address] [phone]                 Signed: Fuller Canada 01/16/2023, 7:58 AM

## 2023-01-16 NOTE — Anesthesia Postprocedure Evaluation (Signed)
Anesthesia Post Note  Patient: Ethan Oliver  Procedure(s) Performed: TOTAL KNEE ARTHROPLASTY (Right: Knee)  Patient location during evaluation: Phase II Anesthesia Type: Spinal Level of consciousness: awake Pain management: pain level controlled Vital Signs Assessment: post-procedure vital signs reviewed and stable Respiratory status: spontaneous breathing and respiratory function stable Cardiovascular status: blood pressure returned to baseline and stable Postop Assessment: no headache and no apparent nausea or vomiting Anesthetic complications: no Comments: Late entry   No notable events documented.   Last Vitals:  Vitals:   01/15/23 1850 01/16/23 0433  BP: 139/76 115/83  Pulse: 85 67  Resp: 18 18  Temp: 36.9 C 37 C  SpO2: 95% 95%    Last Pain:  Vitals:   01/16/23 0433  TempSrc: Oral  PainSc:                  [name]

## 2023-01-16 NOTE — Plan of Care (Signed)
  Problem: Education: Goal: Knowledge of General Education information will improve Description: Including pain rating scale, medication(s)/side effects and non-pharmacologic comfort measures Outcome: Progressing   Problem: Health Behavior/Discharge Planning: Goal: Ability to manage health-related needs will improve Outcome: Progressing   Problem: Clinical Measurements: Goal: Ability to maintain clinical measurements within normal limits will improve Outcome: Progressing Goal: Will remain free from infection Outcome: Progressing Goal: Diagnostic test results will improve Outcome: Progressing Goal: Respiratory complications will improve Outcome: Progressing Goal: Cardiovascular complication will be avoided Outcome: Progressing   Problem: Activity: Goal: Risk for activity intolerance will decrease Outcome: Progressing   Problem: Nutrition: Goal: Adequate nutrition will be maintained Outcome: Progressing   Problem: Coping: Goal: Level of anxiety will decrease Outcome: Progressing   Problem: Elimination: Goal: Will not experience complications related to bowel motility Outcome: Progressing Goal: Will not experience complications related to urinary retention Outcome: Progressing   Problem: Pain Management: Goal: General experience of comfort will improve Outcome: Progressing   Problem: Safety: Goal: Ability to remain free from injury will improve Outcome: Progressing   Problem: Skin Integrity: Goal: Risk for impaired skin integrity will decrease Outcome: Progressing   Problem: Education: Goal: Ability to describe self-care measures that may prevent or decrease complications (Diabetes Survival Skills Education) will improve Outcome: Progressing Goal: Individualized Educational Video(s) Outcome: Progressing   Problem: Coping: Goal: Ability to adjust to condition or change in health will improve Outcome: Progressing   Problem: Fluid Volume: Goal: Ability to  maintain a balanced intake and output will improve Outcome: Progressing   Problem: Health Behavior/Discharge Planning: Goal: Ability to identify and utilize available resources and services will improve Outcome: Progressing Goal: Ability to manage health-related needs will improve Outcome: Progressing   Problem: Metabolic: Goal: Ability to maintain appropriate glucose levels will improve Outcome: Progressing   Problem: Nutritional: Goal: Maintenance of adequate nutrition will improve Outcome: Progressing Goal: Progress toward achieving an optimal weight will improve Outcome: Progressing   Problem: Skin Integrity: Goal: Risk for impaired skin integrity will decrease Outcome: Progressing   Problem: Tissue Perfusion: Goal: Adequacy of tissue perfusion will improve Outcome: Progressing   Problem: Education: Goal: Knowledge of the prescribed therapeutic regimen will improve Outcome: Progressing Goal: Individualized Educational Video(s) Outcome: Progressing   Problem: Activity: Goal: Ability to avoid complications of mobility impairment will improve Outcome: Progressing Goal: Range of joint motion will improve Outcome: Progressing   Problem: Clinical Measurements: Goal: Postoperative complications will be avoided or minimized Outcome: Progressing   Problem: Pain Management: Goal: Pain level will decrease with appropriate interventions Outcome: Progressing   Problem: Skin Integrity: Goal: Will show signs of wound healing Outcome: Progressing

## 2023-01-16 NOTE — Telephone Encounter (Signed)
???    My records show percocet but ok

## 2023-01-16 NOTE — Telephone Encounter (Signed)
Dr. Mort Sawyers pt - spoke w/the pt, he had surgery yesterday.  He stated that Dr. Rexene Edison sent in Hydrocodone and Tramadol for him and the pharmacy will not fill both at the same time.  He stated he can't take the Hydrocodone, it makes him nauseated.  He stated he has had Oxycodone 5 before and he can take that.  [facility].  Pt  - [phone]

## 2023-01-16 NOTE — Telephone Encounter (Signed)
It is the oxycodone  I will call pharmacy tell them to cancel the Tramadol Use the Oxycodone

## 2023-01-20 ENCOUNTER — Telehealth: Payer: Self-pay | Admitting: Orthopedic Surgery

## 2023-01-20 NOTE — Telephone Encounter (Signed)
Dr. Mort Sawyers pt - Ethan Oliver PT w/[facility] [phone] lvm stating pt was evaluated in a [facility] setting for PT on 11/08, recommended frequency for [facility] was 1w1, 3w1, 1w1 w/transition to OP therapy after that.

## 2023-01-21 NOTE — Telephone Encounter (Signed)
I called Ben with verbal orders.

## 2023-01-23 ENCOUNTER — Telehealth: Payer: Self-pay | Admitting: Orthopedic Surgery

## 2023-01-23 ENCOUNTER — Ambulatory Visit: Payer: PPO | Admitting: Orthopedic Surgery

## 2023-01-23 ENCOUNTER — Ambulatory Visit (HOSPITAL_COMMUNITY)
Admission: RE | Admit: 2023-01-23 | Discharge: 2023-01-23 | Disposition: A | Discharge status: Home / Self Care | Payer: PPO | Source: Ambulatory Visit | Attending: Orthopedic Surgery | Admitting: Orthopedic Surgery

## 2023-01-23 DIAGNOSIS — Z96651 Presence of right artificial knee joint: Secondary | ICD-10-CM | POA: Diagnosis present

## 2023-01-23 DIAGNOSIS — Z96652 Presence of left artificial knee joint: Secondary | ICD-10-CM

## 2023-01-23 MED ORDER — APIXABAN 2.5 MG PO TABS: 2.5000 mg | ORAL_TABLET | Freq: Two times a day (BID) | ORAL | 0 refills | Status: DC

## 2023-01-23 NOTE — Progress Notes (Signed)
VISIT TYPE: POST OP VISIT   Chief Complaint  Patient presents with   Knee Pain    DOS 01/15/23 doesn't hurt much. R/calf is swollen and aches like a toothache in the front.    Encounter Diagnoses  Name Primary?   S/P TKR (total knee replacement), left Yes   Status post total right knee replacement     PROCEDURE: Right total knee arthroplasty  DATE OF SURGERY: November 6  POV # 1  Unscheduled visit for [name] he was doing well until Tuesday when he started knowing more swelling in his lower leg he is having pain in his shin and calf with increased swelling  Examination of his right knee shows that he has full extension of the right knee he does have swelling of his calf and shin.  It should be noted he has a planovalgus foot and walks with a severe foot progression angle which is probably stressing his posterior tibial tendon  But in any event he also has some bruising around his thigh from his tourniquet and there is ecchymosis down the back of the leg as well.  His knee looks good his incision has scant drainage he is actually not tender around the knee joint itself  Recommend rule out DVT  Patient cannot tolerate compression hose at this time so we will switch him to aspirin to Eliquis  He should use ice and elevation including the calf area  He will see one of my associates next week to get his staples out  Hopefully by then the swelling will go down and he can regain his range of motion which was 90 degrees as early as Monday and is now decreased to about 45   Current Outpatient Medications:    apixaban (ELIQUIS) 2.5 MG TABS tablet, Take 1 tablet (2.5 mg total) by mouth 2 (two) times daily., Disp: 70 tablet, Rfl: 0   celecoxib (CELEBREX) 100 MG capsule, Take 100 mg by mouth in the morning., Disp: , Rfl:    citalopram (CELEXA) 20 MG tablet, Take 20 mg by mouth in the morning., Disp: , Rfl:    docusate sodium (COLACE) 100 MG capsule, Take 1 capsule (100 mg total)  by mouth 2 (two) times daily., Disp: 10 capsule, Rfl: 0   glipiZIDE (GLUCOTROL XL) 10 MG 24 hr tablet, Take 10 mg by mouth every evening., Disp: , Rfl:    hydrochlorothiazide (HYDRODIURIL) 12.5 MG tablet, Take 12.5 mg by mouth daily., Disp: , Rfl: 3   lisinopril (ZESTRIL) 20 MG tablet, Take 20 mg by mouth in the morning., Disp: , Rfl:    metFORMIN (GLUCOPHAGE-XR) 750 MG 24 hr tablet, Take 750 mg by mouth every morning., Disp: , Rfl:    methocarbamol (ROBAXIN) 500 MG tablet, Take 1 tablet (500 mg total) by mouth every 6 (six) hours as needed for muscle spasms., Disp: 56 tablet, Rfl: 2   Misc. Devices (COMMODE 3-IN-1) MISC, Weight up to 350 lbs 3 in 1 commode, Disp: 1 each, Rfl: 0   oxyCODONE-acetaminophen (PERCOCET) 5-325 MG tablet, Take 1 tablet by mouth every 4 (four) hours as needed for severe pain (pain score 7-10)., Disp: 30 tablet, Rfl: 0   oxyCODONE-acetaminophen (PERCOCET/ROXICET) 5-325 MG tablet, Take 1 tablet by mouth every 4 (four) hours as needed for severe pain (pain score 7-10)., Disp: 30 tablet, Rfl: 0   OZEMPIC, 0.25 OR 0.5 MG/DOSE, 2 MG/3ML SOPN, Inject 0.5 mg into the skin every Sunday., Disp: , Rfl:  polyethylene glycol (MIRALAX / GLYCOLAX) 17 g packet, Take 17 g by mouth daily as needed for mild constipation., Disp: 14 each, Rfl: 0   pravastatin (PRAVACHOL) 40 MG tablet, Take 40 mg by mouth in the morning., Disp: , Rfl:    traMADol (ULTRAM) 50 MG tablet, Take 1 tablet (50 mg total) by mouth every 6 (six) hours as needed., Disp: 30 tablet, Rfl: 0  Current Facility-Administered Medications:    bupivacaine-meloxicam ER (ZYNRELEF) injection 400 mg, 400 mg, Infiltration, Once, Vickki Hearing, MD

## 2023-01-23 NOTE — Patient Instructions (Signed)
Go today/ now for the ultrasound at [facility], they will work you in.

## 2023-01-23 NOTE — Telephone Encounter (Signed)
Left message for patient to call me back the [place] was negative for DVT

## 2023-01-24 ENCOUNTER — Encounter (HOSPITAL_COMMUNITY): Payer: Self-pay | Admitting: Orthopedic Surgery

## 2023-01-29 ENCOUNTER — Ambulatory Visit (HOSPITAL_COMMUNITY): Payer: PPO

## 2023-01-29 ENCOUNTER — Ambulatory Visit (HOSPITAL_COMMUNITY): Payer: PPO | Attending: Orthopedic Surgery

## 2023-01-29 ENCOUNTER — Other Ambulatory Visit: Payer: Self-pay

## 2023-01-29 ENCOUNTER — Encounter (HOSPITAL_COMMUNITY): Payer: Self-pay

## 2023-01-29 DIAGNOSIS — M25661 Stiffness of right knee, not elsewhere classified: Secondary | ICD-10-CM | POA: Diagnosis present

## 2023-01-29 DIAGNOSIS — M25561 Pain in right knee: Secondary | ICD-10-CM | POA: Diagnosis present

## 2023-01-29 DIAGNOSIS — M1711 Unilateral primary osteoarthritis, right knee: Secondary | ICD-10-CM | POA: Diagnosis not present

## 2023-01-29 DIAGNOSIS — M25662 Stiffness of left knee, not elsewhere classified: Secondary | ICD-10-CM | POA: Insufficient documentation

## 2023-01-29 DIAGNOSIS — R2689 Other abnormalities of gait and mobility: Secondary | ICD-10-CM | POA: Diagnosis present

## 2023-01-29 DIAGNOSIS — M25562 Pain in left knee: Secondary | ICD-10-CM | POA: Diagnosis present

## 2023-01-29 NOTE — Addendum Note (Signed)
Addended byBurnadette Peter, Linton Rump S on: 01/29/2023 02:10 PM   Modules accepted: Orders

## 2023-01-29 NOTE — Therapy (Addendum)
OUTPATIENT PHYSICAL THERAPY LOWER EXTREMITY EVALUATION   Patient Name: Ethan Oliver MRN: [id number] DOB:14-Oct-1955, 67 y.o., male Today's Date: 01/29/2023  END OF SESSION:  PT End of Session - 01/29/23 0930     Visit Number 1    Number of Visits 16    Date for PT Re-Evaluation 02/19/23    Authorization Type [employer]    PT Start Time 0932    PT Stop Time 1015    PT Time Calculation (min) 43 min    Activity Tolerance Patient tolerated treatment well    Behavior During Therapy WFL for tasks assessed/performed             Past Medical History:  Diagnosis Date   Arthritis    Chronic back pain    Depression    Diabetes mellitus without complication (HCC)    History of gout    History of kidney stones    Hypercholesteremia    Hypertension    PONV (postoperative nausea and vomiting)    Past Surgical History:  Procedure Laterality Date   ACHILLES TENDON SURGERY Left 12/29/2013   Procedure: PERCUTANEOUS TENDO-ACHILLES LENGTHENING;  Surgeon: Dallas Schimke, DPM;  Location: [facility];  Service: Podiatry;  Laterality: Left;   CALCANEAL OSTEOTOMY WITH ILIAC CREST BONE GRAFT AND REPAIR SUBLEXING TENDON AND ACHILLES TENDON Left 12/29/2013   Procedure: CALCANEOCUBOID JOINT FUSION PERFORMED WITH BONE GRAFT;  Surgeon: Dallas Schimke, DPM;  Location: [facility];  Service: Podiatry;  Laterality: Left;   CARPAL TUNNEL RELEASE Bilateral    COLONOSCOPY WITH PROPOFOL N/A 05/02/2021   Procedure: COLONOSCOPY WITH PROPOFOL;  Surgeon: Malissa Hippo, MD;  Location: [facility];  Service: Endoscopy;  Laterality: N/A;  930   Disk fusion     lumbar   FLEXOR TENOTOMY  Right 02/18/2018   Procedure: PERCUTANEOUS FLEXOR TENOTOMY/APPLICATION OF BK POSTERIOR SPLINT RLE;  Surgeon: Ferman Hamming, DPM;  Location: [facility];  Service: Podiatry;  Laterality: Right;   FOOT ARTHRODESIS Left 12/29/2013   Procedure: TRIPLE ARTHRODESIS FOOT;  Surgeon: Dallas Schimke, DPM;   Location: [facility];  Service: Podiatry;  Laterality: Left;   FOOT ARTHRODESIS Right 02/18/2018   Procedure: TRIPLE ARTHRODESIS RIGHT FOOT;  Surgeon: Ferman Hamming, DPM;  Location: [facility];  Service: Podiatry;  Laterality: Right;   KNEE SURGERY Right    Surgery on this knee twice.    POLYPECTOMY  05/02/2021   Procedure: POLYPECTOMY INTESTINAL;  Surgeon: Malissa Hippo, MD;  Location: [facility];  Service: Endoscopy;;   TOTAL KNEE ARTHROPLASTY Left 04/23/2022   Procedure: TOTAL KNEE ARTHROPLASTY;  Surgeon: Vickki Hearing, MD;  Location: [facility];  Service: Orthopedics;  Laterality: Left;   TOTAL KNEE ARTHROPLASTY Right 01/15/2023   Procedure: TOTAL KNEE ARTHROPLASTY;  Surgeon: Vickki Hearing, MD;  Location: [facility];  Service: Orthopedics;  Laterality: Right;   Patient Active Problem List   Diagnosis Date Noted   Primary osteoarthritis of right knee 01/15/2023   Osteoarthritis of right knee 01/15/2023   S/P TKR (total knee replacement), left 04/23/22 05/07/2022   Primary osteoarthritis of left knee 04/23/2022   Mood disorder (HCC) 09/12/2021   Postlaminectomy syndrome of lumbar region 09/12/2021   Arthritis of left elbow 06/21/2019   Cubital tunnel syndrome on left 06/21/2019   Inflammation of left elbow 06/21/2019   Arthritis, lumbar spine 08/05/2018   Benign essential hypertension 11/07/2016   Generalized osteoarthritis of multiple sites 11/07/2016   Type 2 diabetes mellitus without complication, without  long-term current use of insulin (HCC) 11/07/2016   Diastasis recti 11/07/2016   Pure hypercholesterolemia 11/07/2016   Morbid obesity with BMI of 40.0-44.9, adult (HCC) 12/27/2013   Back pain 12/27/2013   Degeneration of lumbar intervertebral disc 12/27/2013   Lumbar radiculopathy 12/27/2013   Lumbosacral spondylosis without myelopathy 12/28/2012    PCP: Lindaann Slough, DO  REFERRING PROVIDER: Vickki Hearing, MD  REFERRING DIAG: M17.11 (ICD-10-CM) - Unilateral  primary osteoarthritis, right knee  THERAPY DIAG:  Acute pain of right knee  Stiffness of right knee, not elsewhere classified  Rationale for Evaluation and Treatment: Rehabilitation  ONSET DATE: surgery: 01/15/2023  SUBJECTIVE:   SUBJECTIVE STATEMENT: Patient arrives to the clinic accompanied by his wife. Had home health PT since being discharged from the hospital. Had a swelling flare up on 01/21/23. Able to see MD on 11/14 to rule out DVT; was put on Eliquis. Patient is currently retired. He has been able to go up and down steps with little problem other than going slowly. Uses his CPM once a day. Prefers to walk laps in his house 10-15x a day. Not currently having any trouble besides the bandage. Able to climb the 13 steps in his house yesterday so he could take a shower. Had no issues with bathing. Feels stiff and tight in the morning; mostly relieves after moving.  Knows what to expect for this rehab since he had his left knee replaced in February.  Has pain meds PRN; does not often feel the need to use them.  PERTINENT HISTORY: 01/23/2023: pt went to doctor for R swollen calf and aching pain that made it hard for him to tolerate his compression hose. Noted decreased knee flexion from 90 degrees to 45 over 3 days. Ultra sound ruled out DVT. Meds changed from Aspirin to Eliquis. PAIN:  Are you having pain? Yes: NPRS scale: 4-5/10 Pain location: anterior knee Pain description: stiff and tight Aggravating factors: rest, immobility Relieving factors: movement   PRECAUTIONS: None   WEIGHT BEARING RESTRICTIONS: No  FALLS:  Has patient fallen in last 6 months? No  LIVING ENVIRONMENT: Lives with: lives with their spouse Lives in: House/apartment Stairs: Yes: Internal: 13 steps; on right going up and none and External: 2 steps; none Has following equipment at home: None  OCCUPATION: Retired, occasionally does   PLOF: Independent with basic ADLs  PATIENT GOALS: Walk without  limitations; go where he pleases.  NEXT MD VISIT: 01/31/2023 - remove staples and bandage   OBJECTIVE:  Note: Objective measures were completed at Evaluation unless otherwise noted.  DIAGNOSTIC FINDINGS:  PATIENT SURVEYS:  FOTO 43  COGNITION: Overall cognitive status: Within functional limits for tasks assessed     SENSATION: WFL  POSTURE: rounded shoulders and forward head  PALPATION: Tender over inferior-medial knee / tibial plateau. Tender over mid thigh   LOWER EXTREMITY ROM:  Active ROM Right eval Left eval  Hip flexion    Hip extension    Hip abduction    Hip adduction    Hip internal rotation    Hip external rotation    Knee flexion 89   Knee extension -2   Ankle dorsiflexion    Ankle plantarflexion    Ankle inversion    Ankle eversion     (Blank rows = not tested)  LOWER EXTREMITY MMT:  MMT Right eval Left eval  Hip flexion    Hip extension    Hip abduction    Hip adduction    Hip internal  rotation    Hip external rotation    Knee flexion    Knee extension    Ankle dorsiflexion    Ankle plantarflexion    Ankle inversion    Ankle eversion     (Blank rows = not tested)   FUNCTIONAL TESTS:  5 times sit to stand: 24.2 sec 2 minute walk test: 228 feet  GAIT: Distance walked: 350 feet in clinic Assistive device utilized: None Level of assistance: Complete Independence Comments: Walks with externally rotated and excessively pronated feet bilaterally. Decreased stride length and moderately increased lateral trunk sway.    TODAY'S TREATMENT:                                                                                                                              DATE:   01/29/2023: Evaluation and HEP education  PATIENT EDUCATION:  Education details: Patient educated on POC, scope of PT, HEP, and swelling management strategies. Person educated: Patient Education method: Explanation, Demonstration, and Handouts Education comprehension:  verbalized understanding, returned demonstration, verbal cues required, and tactile cues required   HOME EXERCISE PROGRAM: Access Code: [email or web address]  URL: [email or web address]  Date: 01/29/2023 Prepared by: [facility] - Rehab Exercises -  Supine Ankle Pumps  - 2 x daily - 7 x weekly - 1 sets - 15 reps  Supine Quadricep Sets  - 2 x daily - 7 x weekly - 1 sets - 10 reps  Supine Active Straight Leg Raise  - 2 x daily - 7 x weekly - 1 sets - 10 reps  Supine Short Arc Quad  - 2 x daily - 7 x weekly - 1 sets - 15 reps  Supine Heel Slides  - 2 x daily - 7 x weekly - 1 sets - 15 reps  Seated Long Arc Quad  - 2 x daily - 7 x weekly - 1 sets - 15 reps  Seated Knee Flexion Stretch  - 2 x daily - 7 x weekly - 1 sets - 15 reps   ASSESSMENT:  CLINICAL IMPRESSION: Patient is a 67 y.o. male who was seen today for physical therapy evaluation and treatment for unilateral primary osteoarthritis, right knee, s/p TKA. Patient is doing very well for being two week post R TKA. His current right knee ROM of -2 to 89 degrees and functional capacity indicate a good prognosis. He has been keeping up with his hospital and home health HEP. Still significant ecchymosis throughout a majority of is R LE from posterior thigh to lateral ankle. Skin is tight and dry. Patient had a prior TKA on the left earlier this year in February and had success without complication. He knows his rehab process and is confident he'll be successful again; currently on track. Patient will benefit from physical therapy services to promote return to optimal function, increase ROM, and return to independence.   OBJECTIVE IMPAIRMENTS: decreased mobility, decreased ROM, and obesity.   ACTIVITY LIMITATIONS: lifting, sitting, squatting, and  locomotion level  PARTICIPATION LIMITATIONS: driving  PERSONAL FACTORS: Fitness is also affecting patient's functional outcome.   REHAB POTENTIAL: Excellent  CLINICAL DECISION MAKING:  Stable/uncomplicated  EVALUATION COMPLEXITY: Low   GOALS: Goals reviewed with patient? No  SHORT TERM GOALS: Target date: 02/19/2023 Patient will be independent with initial HEP to improve self management strategies. Baseline: Goal status: INITIAL  2.  Patient will be able to perform a sit-to-stand and stand-to-sit transfer without the use of his hands in order to demonstrate sufficient functional strength of R LE. Baseline:  Goal status: INITIAL  3.  Patient will be able to flex knee to >115 degrees to increase functional ROM and mobility.  Baseline:  Goal status: INITIAL   LONG TERM GOALS: Target date: 03/12/2023  By discharge patient will improve 2 MWT to >318ft in order to demonstrate improved functional ambulatory capacity in community setting.       Baseline: 228 feet Goal status: INITIAL  2.  By discharge, patient will increase FOTO score by >15 points in order to improve functional capacity and tolerance to daily activity.  Baseline:  Goal status: INITIAL  3.  Patient will report <2/10 pain while performing functional recreational activities for > 45 minutes in order to improve endurance capacity. Baseline: 4-5/10 Goal status: INITIAL  4.  Patient will be able to drive a car in order to promote participation and independence. Baseline:  Goal status: INITIAL   5.  Patient will improve 5x STS time to <15 sec in order to demonstrate improved functional ambulatory capacity in community setting. Baseline:  Goal status: INITIAL  PLAN:  PT FREQUENCY: 3x/week  PT DURATION: 6 weeks  PLANNED INTERVENTIONS: 97164- PT Re-evaluation, 97110-Therapeutic exercises, 97530- Therapeutic activity, 97112- Neuromuscular re-education, 97535- Self Care, 14782- Manual therapy, [phone]- Gait training, Balance training, Stair training, Joint mobilization, and Scar mobilization  PLAN FOR NEXT SESSION: Review goals and HEP compliance. Start scar massage and decrease swelling in knee. Work  on standing exercises for knee flexion strength in weight bearing.   Chas Deniese Oberry, Student-PT 01/29/2023, 11:14 AM  " I agree with the following treatment note after reviewing documentation. This session was performed under the supervision of a licensed clinician."    2:08 PM, 01/29/23 Amy Small Lynch MPT [facility] [place] [phone]

## 2023-01-30 ENCOUNTER — Encounter: Payer: PPO | Admitting: Orthopedic Surgery

## 2023-01-31 ENCOUNTER — Ambulatory Visit (INDEPENDENT_AMBULATORY_CARE_PROVIDER_SITE_OTHER): Payer: PPO | Admitting: Orthopedic Surgery

## 2023-01-31 ENCOUNTER — Encounter: Payer: Self-pay | Admitting: Orthopedic Surgery

## 2023-01-31 ENCOUNTER — Encounter: Payer: PPO | Admitting: Orthopedic Surgery

## 2023-01-31 ENCOUNTER — Encounter (HOSPITAL_COMMUNITY): Payer: PPO

## 2023-01-31 DIAGNOSIS — Z96651 Presence of right artificial knee joint: Secondary | ICD-10-CM | POA: Insufficient documentation

## 2023-01-31 MED ORDER — OXYCODONE-ACETAMINOPHEN 5-325 MG PO TABS: 1.0000 | ORAL_TABLET | ORAL | 0 refills | Status: DC | PRN

## 2023-01-31 NOTE — Progress Notes (Signed)
Orthopaedic Postop Note  Assessment: Ethan Oliver is a 67 y.o. male s/p right total knee arthroplasty, Dr. Romeo Apple  DOS: 01/15/2023  Plan: Cherlynn Polo removed.  Dressing was replaced.  Continue to work with physical therapy.  Focus on range of motion, maintaining full extension, and increasing your flexion.  Okay to shower.  Do not scrub the incision, and pat the incision dry.  Do not submerge in a pool, hot tub or bathtub.  Okay for massage to help with swelling and decrease scar tissue formation, but please do not place any lotions on the incision.  Limited prescription of Percocet provided, to be taken only as needed.  Follow-up with Dr. Romeo Apple in 2 weeks.    Meds ordered this encounter  Medications   oxyCODONE-acetaminophen (PERCOCET/ROXICET) 5-325 MG tablet    Sig: Take 1 tablet by mouth every 4 (four) hours as needed for severe pain (pain score 7-10).    Dispense:  15 tablet    Refill:  0     Follow-up: Return in about 2 weeks (around 02/14/2023) for Follow-up with Dr. Romeo Apple.   Subjective:  Chief Complaint  Patient presents with   Post-op Follow-up    Right 01/15/23     History of Present Illness: Ethan Oliver is a 67 y.o. male who presents following the above stated procedure.  Surgery was approximate 2 weeks ago with Dr. Romeo Apple.  He was seen earlier than expected, due to swelling.  DVT has been ruled out.  His pain has been controlled.  He has not take any pain medicines in 3 days.  He states it all depends on how therapy goes.  He is pleased with his progress.  He is feeling better.  He continues to take Eliquis.  Review of Systems: No fevers or chills No numbness or tingling No Chest Pain No shortness of breath   Objective: There were no vitals taken for this visit.  Physical Exam:  Alert and oriented.  No acute distress.  Right sided antalgic gait. Surgical incision is healing well.  No surrounding erythema or drainage. Easily gets to full extension.   Tolerates flexion to approximately 95 degrees. Mild swelling.  Mild tenderness to palpation.  IMAGING: I personally ordered and reviewed the following images:  No new imaging obtained today.   [name], MD 01/31/2023 10:39 AM

## 2023-01-31 NOTE — Patient Instructions (Signed)
Follow-up with Dr. Romeo Apple in 2 weeks.

## 2023-02-03 ENCOUNTER — Encounter (HOSPITAL_COMMUNITY): Payer: PPO

## 2023-02-05 ENCOUNTER — Ambulatory Visit (HOSPITAL_COMMUNITY): Payer: PPO | Admitting: Physical Therapy

## 2023-02-05 DIAGNOSIS — M25661 Stiffness of right knee, not elsewhere classified: Secondary | ICD-10-CM

## 2023-02-05 DIAGNOSIS — M25562 Pain in left knee: Secondary | ICD-10-CM

## 2023-02-05 DIAGNOSIS — R2689 Other abnormalities of gait and mobility: Secondary | ICD-10-CM

## 2023-02-05 DIAGNOSIS — M25662 Stiffness of left knee, not elsewhere classified: Secondary | ICD-10-CM

## 2023-02-05 DIAGNOSIS — M25561 Pain in right knee: Secondary | ICD-10-CM | POA: Diagnosis not present

## 2023-02-05 NOTE — Therapy (Signed)
OUTPATIENT PHYSICAL THERAPY TREATMENT   Patient Name: Ethan Oliver MRN: [id number] DOB:10-Oct-1955, 67 y.o., male Today's Date: 02/05/2023  END OF SESSION:  PT End of Session - 02/05/23 0936     Visit Number 2    Number of Visits 16    Date for PT Re-Evaluation 02/19/23    Authorization Type [employer]    Progress Note Due on Visit 10    PT Start Time 0935    PT Stop Time 1015    PT Time Calculation (min) 40 min    Activity Tolerance Patient tolerated treatment well    Behavior During Therapy WFL for tasks assessed/performed             Past Medical History:  Diagnosis Date   Arthritis    Chronic back pain    Depression    Diabetes mellitus without complication (HCC)    History of gout    History of kidney stones    Hypercholesteremia    Hypertension    PONV (postoperative nausea and vomiting)    Past Surgical History:  Procedure Laterality Date   ACHILLES TENDON SURGERY Left 12/29/2013   Procedure: PERCUTANEOUS TENDO-ACHILLES LENGTHENING;  Surgeon: Dallas Schimke, DPM;  Location: [facility];  Service: Podiatry;  Laterality: Left;   CALCANEAL OSTEOTOMY WITH ILIAC CREST BONE GRAFT AND REPAIR SUBLEXING TENDON AND ACHILLES TENDON Left 12/29/2013   Procedure: CALCANEOCUBOID JOINT FUSION PERFORMED WITH BONE GRAFT;  Surgeon: Dallas Schimke, DPM;  Location: [facility];  Service: Podiatry;  Laterality: Left;   CARPAL TUNNEL RELEASE Bilateral    COLONOSCOPY WITH PROPOFOL N/A 05/02/2021   Procedure: COLONOSCOPY WITH PROPOFOL;  Surgeon: Malissa Hippo, MD;  Location: [facility];  Service: Endoscopy;  Laterality: N/A;  930   Disk fusion     lumbar   FLEXOR TENOTOMY  Right 02/18/2018   Procedure: PERCUTANEOUS FLEXOR TENOTOMY/APPLICATION OF BK POSTERIOR SPLINT RLE;  Surgeon: Ferman Hamming, DPM;  Location: [facility];  Service: Podiatry;  Laterality: Right;   FOOT ARTHRODESIS Left 12/29/2013   Procedure: TRIPLE ARTHRODESIS FOOT;  Surgeon: Dallas Schimke, DPM;  Location: [facility];  Service: Podiatry;  Laterality: Left;   FOOT ARTHRODESIS Right 02/18/2018   Procedure: TRIPLE ARTHRODESIS RIGHT FOOT;  Surgeon: Ferman Hamming, DPM;  Location: [facility];  Service: Podiatry;  Laterality: Right;   KNEE SURGERY Right    Surgery on this knee twice.    POLYPECTOMY  05/02/2021   Procedure: POLYPECTOMY INTESTINAL;  Surgeon: Malissa Hippo, MD;  Location: [facility];  Service: Endoscopy;;   TOTAL KNEE ARTHROPLASTY Left 04/23/2022   Procedure: TOTAL KNEE ARTHROPLASTY;  Surgeon: Vickki Hearing, MD;  Location: [facility];  Service: Orthopedics;  Laterality: Left;   TOTAL KNEE ARTHROPLASTY Right 01/15/2023   Procedure: TOTAL KNEE ARTHROPLASTY;  Surgeon: Vickki Hearing, MD;  Location: [facility];  Service: Orthopedics;  Laterality: Right;   Patient Active Problem List   Diagnosis Date Noted   Status post total right knee replacement 01/15/23 01/31/2023   Primary osteoarthritis of right knee 01/15/2023   Osteoarthritis of right knee 01/15/2023   S/P TKR (total knee replacement), left 04/23/22 05/07/2022   Primary osteoarthritis of left knee 04/23/2022   Mood disorder (HCC) 09/12/2021   Postlaminectomy syndrome of lumbar region 09/12/2021   Arthritis of left elbow 06/21/2019   Cubital tunnel syndrome on left 06/21/2019   Inflammation of left elbow 06/21/2019   Arthritis, lumbar spine 08/05/2018   Benign essential hypertension 11/07/2016  Generalized osteoarthritis of multiple sites 11/07/2016   Type 2 diabetes mellitus without complication, without long-term current use of insulin (HCC) 11/07/2016   Diastasis recti 11/07/2016   Pure hypercholesterolemia 11/07/2016   Morbid obesity with BMI of 40.0-44.9, adult (HCC) 12/27/2013   Back pain 12/27/2013   Degeneration of lumbar intervertebral disc 12/27/2013   Lumbar radiculopathy 12/27/2013   Lumbosacral spondylosis without myelopathy 12/28/2012    PCP: Lindaann Slough, DO  REFERRING  PROVIDER: Vickki Hearing, MD  REFERRING DIAG: M17.11 (ICD-10-CM) - Unilateral primary osteoarthritis, right knee  THERAPY DIAG:  Acute pain of right knee  Stiffness of right knee, not elsewhere classified  Left knee pain, unspecified chronicity  Stiffness of left knee, not elsewhere classified  Other abnormalities of gait and mobility  Rationale for Evaluation and Treatment: Rehabilitation  ONSET DATE: surgery: 01/15/2023  SUBJECTIVE:   SUBJECTIVE STATEMENT: Pt states his knee is so stiff especially at proximal end of scar.  Reports stiffness also.   Patient arrives to the clinic accompanied by his wife. Had home health PT since being discharged from the hospital. Had a swelling flare up on 01/21/23. Able to see MD on 11/14 to rule out DVT; was put on Eliquis. Patient is currently retired. He has been able to go up and down steps with little problem other than going slowly. Uses his CPM once a day. Prefers to walk laps in his house 10-15x a day. Not currently having any trouble besides the bandage. Able to climb the 13 steps in his house yesterday so he could take a shower. Had no issues with bathing. Feels stiff and tight in the morning; mostly relieves after moving.  Knows what to expect for this rehab since he had his left knee replaced in February.  Has pain meds PRN; does not often feel the need to use them.  PERTINENT HISTORY: 01/23/2023: pt went to doctor for R swollen calf and aching pain that made it hard for him to tolerate his compression hose. Noted decreased knee flexion from 90 degrees to 45 over 3 days. Ultra sound ruled out DVT. Meds changed from Aspirin to Eliquis. PAIN:  Are you having pain? Yes: NPRS scale: 4-5/10 Pain location: anterior knee Pain description: stiff and tight Aggravating factors: rest, immobility Relieving factors: movement   PRECAUTIONS: None   WEIGHT BEARING RESTRICTIONS: No  FALLS:  Has patient fallen in last 6 months? No  LIVING  ENVIRONMENT: Lives with: lives with their spouse Lives in: House/apartment Stairs: Yes: Internal: 13 steps; on right going up and none and External: 2 steps; none Has following equipment at home: None  OCCUPATION: Retired, occasionally does   PLOF: Independent with basic ADLs  PATIENT GOALS: Walk without limitations; go where he pleases.  NEXT MD VISIT: 01/31/2023 - remove staples and bandage   OBJECTIVE:  Note: Objective measures were completed at Evaluation unless otherwise noted.  DIAGNOSTIC FINDINGS:  PATIENT SURVEYS:  FOTO 63  COGNITION: Overall cognitive status: Within functional limits for tasks assessed     SENSATION: WFL  POSTURE: rounded shoulders and forward head  PALPATION: Tender over inferior-medial knee / tibial plateau. Tender over mid thigh   LOWER EXTREMITY ROM:  Active ROM Right eval Left eval  Hip flexion    Hip extension    Hip abduction    Hip adduction    Hip internal rotation    Hip external rotation    Knee flexion 89   Knee extension -2   Ankle dorsiflexion  Ankle plantarflexion    Ankle inversion    Ankle eversion     (Blank rows = not tested)  LOWER EXTREMITY MMT:  MMT Right eval Left eval  Hip flexion    Hip extension    Hip abduction    Hip adduction    Hip internal rotation    Hip external rotation    Knee flexion    Knee extension    Ankle dorsiflexion    Ankle plantarflexion    Ankle inversion    Ankle eversion     (Blank rows = not tested)   FUNCTIONAL TESTS:  5 times sit to stand: 24.2 sec 2 minute walk test: 228 feet  GAIT: Distance walked: 350 feet in clinic Assistive device utilized: None Level of assistance: Complete Independence Comments: Walks with externally rotated and excessively pronated feet bilaterally. Decreased stride length and moderately increased lateral trunk sway.    TODAY'S TREATMENT:                                                                                                                               DATE:  01/29/2023: Evaluation and HEP education  02/05/23 Goal review Bike seat  rocking 5 minutes Standing:  knee flexion stretch on 12" step 10X10"  Rt knee flexion 10X each Supine:  scar massage, retro following for edema AROM Rt knee 103 degrees  PATIENT EDUCATION:  Education details: Patient educated on POC, scope of PT, HEP, and swelling management strategies. Person educated: Patient Education method: Explanation, Demonstration, and Handouts Education comprehension: verbalized understanding, returned demonstration, verbal cues required, and tactile cues required   HOME EXERCISE PROGRAM: Access Code: [email or web address]  URL: [email or web address]  Date: 01/29/2023 Prepared by: [facility] - Rehab Exercises -  Supine Ankle Pumps  - 2 x daily - 7 x weekly - 1 sets - 15 reps  Supine Quadricep Sets  - 2 x daily - 7 x weekly - 1 sets - 10 reps  Supine Active Straight Leg Raise  - 2 x daily - 7 x weekly - 1 sets - 10 reps  Supine Short Arc Quad  - 2 x daily - 7 x weekly - 1 sets - 15 reps  Supine Heel Slides  - 2 x daily - 7 x weekly - 1 sets - 15 reps  Seated Long Arc Quad  - 2 x daily - 7 x weekly - 1 sets - 15 reps  Seated Knee Flexion Stretch  - 2 x daily - 7 x weekly - 1 sets - 15 reps   ASSESSMENT:  CLINICAL IMPRESSION: Reviewed goals and POC moving forward.  Began with bike followed by standing knee flexion stretch to progress ROM.  Pt unable to make full revolutions due to the tightness, mostly visible along proximal scar.  Began scar massage as well as edema massage to Rt knee and educated with technique for wife to complete at home.  Does have considerable induration perimeter of knee  and scar tissue along entire scar line, proximally being most tight.  Noted reduction in tightness following manual and knee flexion achieved at 103 degrees actively following.  Patient will continue to benefit from physical therapy services to promote return to  optimal function, increase ROM, and return to independence.   OBJECTIVE IMPAIRMENTS: decreased mobility, decreased ROM, and obesity.   ACTIVITY LIMITATIONS: lifting, sitting, squatting, and locomotion level  PARTICIPATION LIMITATIONS: driving  PERSONAL FACTORS: Fitness is also affecting patient's functional outcome.   REHAB POTENTIAL: Excellent  CLINICAL DECISION MAKING: Stable/uncomplicated  EVALUATION COMPLEXITY: Low   GOALS: Goals reviewed with patient? No  SHORT TERM GOALS: Target date: 02/19/2023 Patient will be independent with initial HEP to improve self management strategies. Baseline: Goal status: INITIAL  2.  Patient will be able to perform a sit-to-stand and stand-to-sit transfer without the use of his hands in order to demonstrate sufficient functional strength of R LE. Baseline:  Goal status: INITIAL  3.  Patient will be able to flex knee to >115 degrees to increase functional ROM and mobility.  Baseline:  Goal status: INITIAL   LONG TERM GOALS: Target date: 03/12/2023  By discharge patient will improve 2 MWT to >316ft in order to demonstrate improved functional ambulatory capacity in community setting.       Baseline: 228 feet Goal status: INITIAL  2.  By discharge, patient will increase FOTO score by >15 points in order to improve functional capacity and tolerance to daily activity.  Baseline:  Goal status: INITIAL  3.  Patient will report <2/10 pain while performing functional recreational activities for > 45 minutes in order to improve endurance capacity. Baseline: 4-5/10 Goal status: INITIAL  4.  Patient will be able to drive a car in order to promote participation and independence. Baseline:  Goal status: INITIAL   5.  Patient will improve 5x STS time to <15 sec in order to demonstrate improved functional ambulatory capacity in community setting. Baseline:  Goal status: INITIAL  PLAN:  PT FREQUENCY: 3x/week  PT DURATION: 6 weeks  PLANNED  INTERVENTIONS: 97164- PT Re-evaluation, 97110-Therapeutic exercises, 97530- Therapeutic activity, 97112- Neuromuscular re-education, 97535- Self Care, 16109- Manual therapy, [phone]- Gait training, Balance training, Stair training, Joint mobilization, and Scar mobilization  PLAN FOR NEXT SESSION: continue scar massage and decrease swelling in knee. Progress standing exercises for knee flexion strength in weight bearing.   Lurena Nida, PTA 02/05/2023, 10:49 AM    10:49 AM, 02/05/23

## 2023-02-10 ENCOUNTER — Ambulatory Visit (HOSPITAL_COMMUNITY): Payer: PPO | Attending: Orthopedic Surgery | Admitting: Physical Therapy

## 2023-02-10 DIAGNOSIS — M25562 Pain in left knee: Secondary | ICD-10-CM | POA: Diagnosis present

## 2023-02-10 DIAGNOSIS — M25662 Stiffness of left knee, not elsewhere classified: Secondary | ICD-10-CM | POA: Insufficient documentation

## 2023-02-10 DIAGNOSIS — M25661 Stiffness of right knee, not elsewhere classified: Secondary | ICD-10-CM | POA: Diagnosis present

## 2023-02-10 DIAGNOSIS — R2689 Other abnormalities of gait and mobility: Secondary | ICD-10-CM | POA: Diagnosis present

## 2023-02-10 DIAGNOSIS — M25561 Pain in right knee: Secondary | ICD-10-CM | POA: Insufficient documentation

## 2023-02-10 NOTE — Therapy (Signed)
OUTPATIENT PHYSICAL THERAPY TREATMENT   Patient Name: MANFRED PREVATTE MRN: [id number] DOB:08-18-1955, 67 y.o., male Today's Date: 02/10/2023  END OF SESSION:  PT End of Session - 02/10/23 0931     Visit Number 3    Number of Visits 16    Date for PT Re-Evaluation 02/19/23    Authorization Type [employer]    Progress Note Due on Visit 10    PT Start Time 0931    PT Stop Time 1010    PT Time Calculation (min) 39 min    Activity Tolerance Patient tolerated treatment well    Behavior During Therapy WFL for tasks assessed/performed             Past Medical History:  Diagnosis Date   Arthritis    Chronic back pain    Depression    Diabetes mellitus without complication (HCC)    History of gout    History of kidney stones    Hypercholesteremia    Hypertension    PONV (postoperative nausea and vomiting)    Past Surgical History:  Procedure Laterality Date   ACHILLES TENDON SURGERY Left 12/29/2013   Procedure: PERCUTANEOUS TENDO-ACHILLES LENGTHENING;  Surgeon: Dallas Schimke, DPM;  Location: [facility];  Service: Podiatry;  Laterality: Left;   CALCANEAL OSTEOTOMY WITH ILIAC CREST BONE GRAFT AND REPAIR SUBLEXING TENDON AND ACHILLES TENDON Left 12/29/2013   Procedure: CALCANEOCUBOID JOINT FUSION PERFORMED WITH BONE GRAFT;  Surgeon: Dallas Schimke, DPM;  Location: [facility];  Service: Podiatry;  Laterality: Left;   CARPAL TUNNEL RELEASE Bilateral    COLONOSCOPY WITH PROPOFOL N/A 05/02/2021   Procedure: COLONOSCOPY WITH PROPOFOL;  Surgeon: Malissa Hippo, MD;  [facility];  Service: Endoscopy;  Laterality: N/A;  930   Disk fusion     lumbar   FLEXOR TENOTOMY  Right 02/18/2018   Procedure: PERCUTANEOUS FLEXOR TENOTOMY/APPLICATION OF BK POSTERIOR SPLINT RLE;  Surgeon: Ferman Hamming, DPM;  Location: [facility];  Service: Podiatry;  Laterality: Right;   FOOT ARTHRODESIS Left 12/29/2013   Procedure: TRIPLE ARTHRODESIS FOOT;  Surgeon: Dallas Schimke, DPM;  Location: [facility];  Service: Podiatry;  Laterality: Left;   FOOT ARTHRODESIS Right 02/18/2018   Procedure: TRIPLE ARTHRODESIS RIGHT FOOT;  Surgeon: Ferman Hamming, DPM;  Location: [facility];  Service: Podiatry;  Laterality: Right;   KNEE SURGERY Right    Surgery on this knee twice.    POLYPECTOMY  05/02/2021   Procedure: POLYPECTOMY INTESTINAL;  Surgeon: Malissa Hippo, MD;  [facility];  Service: Endoscopy;;   TOTAL KNEE ARTHROPLASTY Left 04/23/2022   Procedure: TOTAL KNEE ARTHROPLASTY;  Surgeon: Vickki Hearing, MD;  Location: [facility];  Service: Orthopedics;  Laterality: Left;   TOTAL KNEE ARTHROPLASTY Right 01/15/2023   Procedure: TOTAL KNEE ARTHROPLASTY;  Surgeon: Vickki Hearing, MD;  Location: [facility];  Service: Orthopedics;  Laterality: Right;   Patient Active Problem List   Diagnosis Date Noted   Status post total right knee replacement 01/15/23 01/31/2023   Primary osteoarthritis of right knee 01/15/2023   Osteoarthritis of right knee 01/15/2023   S/P TKR (total knee replacement), left 04/23/22 05/07/2022   Primary osteoarthritis of left knee 04/23/2022   Mood disorder (HCC) 09/12/2021   Postlaminectomy syndrome of lumbar region 09/12/2021   Arthritis of left elbow 06/21/2019   Cubital tunnel syndrome on left 06/21/2019   Inflammation of left elbow 06/21/2019   Arthritis, lumbar spine 08/05/2018   Benign essential hypertension 11/07/2016  Generalized osteoarthritis of multiple sites 11/07/2016   Type 2 diabetes mellitus without complication, without long-term current use of insulin (HCC) 11/07/2016   Diastasis recti 11/07/2016   Pure hypercholesterolemia 11/07/2016   Morbid obesity with BMI of 40.0-44.9, adult (HCC) 12/27/2013   Back pain 12/27/2013   Degeneration of lumbar intervertebral disc 12/27/2013   Lumbar radiculopathy 12/27/2013   Lumbosacral spondylosis without myelopathy 12/28/2012    PCP: Lindaann Slough, DO  REFERRING  PROVIDER: Vickki Hearing, MD  REFERRING DIAG: M17.11 (ICD-10-CM) - Unilateral primary osteoarthritis, right knee  THERAPY DIAG:  Acute pain of right knee  Stiffness of right knee, not elsewhere classified  Left knee pain, unspecified chronicity  Rationale for Evaluation and Treatment: Rehabilitation  ONSET DATE: surgery: 01/15/2023  SUBJECTIVE:   SUBJECTIVE STATEMENT: Pt states his knee is stiff this morning but no pain unless he bends it into full range then 4/10.  Patient arrives to the clinic accompanied by his wife. Had home health PT since being discharged from the hospital. Had a swelling flare up on 01/21/23. Able to see MD on 11/14 to rule out DVT; was put on Eliquis. Patient is currently retired. He has been able to go up and down steps with little problem other than going slowly. Uses his CPM once a day. Prefers to walk laps in his house 10-15x a day. Not currently having any trouble besides the bandage. Able to climb the 13 steps in his house yesterday so he could take a shower. Had no issues with bathing. Feels stiff and tight in the morning; mostly relieves after moving.  Knows what to expect for this rehab since he had his left knee replaced in February.  Has pain meds PRN; does not often feel the need to use them.  PERTINENT HISTORY: 01/23/2023: pt went to doctor for R swollen calf and aching pain that made it hard for him to tolerate his compression hose. Noted decreased knee flexion from 90 degrees to 45 over 3 days. Ultra sound ruled out DVT. Meds changed from Aspirin to Eliquis. PAIN:  Are you having pain? Yes: NPRS scale: 4-5/10 Pain location: anterior knee Pain description: stiff and tight Aggravating factors: rest, immobility Relieving factors: movement   PRECAUTIONS: None   WEIGHT BEARING RESTRICTIONS: No  FALLS:  Has patient fallen in last 6 months? No  LIVING ENVIRONMENT: Lives with: lives with their spouse Lives in: House/apartment Stairs: Yes:  Internal: 13 steps; on right going up and none and External: 2 steps; none Has following equipment at home: None  OCCUPATION: Retired, occasionally does   PLOF: Independent with basic ADLs  PATIENT GOALS: Walk without limitations; go where he pleases.  NEXT MD VISIT: 01/31/2023 - remove staples and bandage   OBJECTIVE:  Note: Objective measures were completed at Evaluation unless otherwise noted.  DIAGNOSTIC FINDINGS:  PATIENT SURVEYS:  FOTO 5  COGNITION: Overall cognitive status: Within functional limits for tasks assessed     SENSATION: WFL  POSTURE: rounded shoulders and forward head  PALPATION: Tender over inferior-medial knee / tibial plateau. Tender over mid thigh   LOWER EXTREMITY ROM:  Active ROM Right eval Left eval  Hip flexion    Hip extension    Hip abduction    Hip adduction    Hip internal rotation    Hip external rotation    Knee flexion 89   Knee extension -2   Ankle dorsiflexion    Ankle plantarflexion    Ankle inversion    Ankle eversion     (  Blank rows = not tested)  LOWER EXTREMITY MMT:  MMT Right eval Left eval  Hip flexion    Hip extension    Hip abduction    Hip adduction    Hip internal rotation    Hip external rotation    Knee flexion    Knee extension    Ankle dorsiflexion    Ankle plantarflexion    Ankle inversion    Ankle eversion     (Blank rows = not tested)   FUNCTIONAL TESTS:  5 times sit to stand: 24.2 sec 2 minute walk test: 228 feet  GAIT: Distance walked: 350 feet in clinic Assistive device utilized: None Level of assistance: Complete Independence Comments: Walks with externally rotated and excessively pronated feet bilaterally. Decreased stride length and moderately increased lateral trunk sway.    TODAY'S TREATMENT:                                                                                                                              DATE:  02/10/23 Bike seat 16 rocking 5 minutes Standing  knee flexion stretch 12" step 10X10"  Rt knee flexion 15X  Hamstring stretch 10X10" Supine:  SLR LT 10X  Heelslides 10X before and after manual Manual to Rt knee including MFR and retro massage AROM before manual 100 degrees, following manual 105 degrees  02/05/23 Goal review Bike seat  rocking 5 minutes Standing:  knee flexion stretch on 12" step 10X10"  Rt knee flexion 10X each Supine:  scar massage, retro following for edema AROM Rt knee 103 degrees  01/29/2023: Evaluation and HEP education  PATIENT EDUCATION:  Education details: Patient educated on POC, scope of PT, HEP, and swelling management strategies. Person educated: Patient Education method: Explanation, Demonstration, and Handouts Education comprehension: verbalized understanding, returned demonstration, verbal cues required, and tactile cues required   HOME EXERCISE PROGRAM: Access Code: [email or web address]  URL: [email or web address]  Date: 01/29/2023 Prepared by: [facility] - Rehab Exercises -  Supine Ankle Pumps  - 2 x daily - 7 x weekly - 1 sets - 15 reps  Supine Quadricep Sets  - 2 x daily - 7 x weekly - 1 sets - 10 reps  Supine Active Straight Leg Raise  - 2 x daily - 7 x weekly - 1 sets - 10 reps  Supine Short Arc Quad  - 2 x daily - 7 x weekly - 1 sets - 15 reps  Supine Heel Slides  - 2 x daily - 7 x weekly - 1 sets - 15 reps  Seated Long Arc Quad  - 2 x daily - 7 x weekly - 1 sets - 15 reps  Seated Knee Flexion Stretch  - 2 x daily - 7 x weekly - 1 sets - 15 reps   ASSESSMENT:  CLINICAL IMPRESSION: Began on bike with improved stretch in each direction but still unable to make full revolutions. Reports wife if also massaging knee at home in addition to  help improve elasticity and reduce scar tissue.  Noted increase in induration/tightness today when completing manual.  AROM flexion prior to manual 100, following 105 degrees.  Patient will continue to benefit from physical therapy services to promote return to  optimal function, increase ROM, and return to independence.   OBJECTIVE IMPAIRMENTS: decreased mobility, decreased ROM, and obesity.   ACTIVITY LIMITATIONS: lifting, sitting, squatting, and locomotion level  PARTICIPATION LIMITATIONS: driving  PERSONAL FACTORS: Fitness is also affecting patient's functional outcome.   REHAB POTENTIAL: Excellent  CLINICAL DECISION MAKING: Stable/uncomplicated  EVALUATION COMPLEXITY: Low   GOALS: Goals reviewed with patient? No  SHORT TERM GOALS: Target date: 02/19/2023 Patient will be independent with initial HEP to improve self management strategies. Baseline: Goal status: INITIAL  2.  Patient will be able to perform a sit-to-stand and stand-to-sit transfer without the use of his hands in order to demonstrate sufficient functional strength of R LE. Baseline:  Goal status: INITIAL  3.  Patient will be able to flex knee to >115 degrees to increase functional ROM and mobility.  Baseline:  Goal status: INITIAL   LONG TERM GOALS: Target date: 03/12/2023  By discharge patient will improve 2 MWT to >369ft in order to demonstrate improved functional ambulatory capacity in community setting.       Baseline: 228 feet Goal status: INITIAL  2.  By discharge, patient will increase FOTO score by >15 points in order to improve functional capacity and tolerance to daily activity.  Baseline:  Goal status: INITIAL  3.  Patient will report <2/10 pain while performing functional recreational activities for > 45 minutes in order to improve endurance capacity. Baseline: 4-5/10 Goal status: INITIAL  4.  Patient will be able to drive a car in order to promote participation and independence. Baseline:  Goal status: INITIAL   5.  Patient will improve 5x STS time to <15 sec in order to demonstrate improved functional ambulatory capacity in community setting. Baseline:  Goal status: INITIAL  PLAN:  PT FREQUENCY: 3x/week  PT DURATION: 6 weeks  PLANNED  INTERVENTIONS: 97164- PT Re-evaluation, 97110-Therapeutic exercises, 97530- Therapeutic activity, 97112- Neuromuscular re-education, 97535- Self Care, 13086- Manual therapy, [phone]- Gait training, Balance training, Stair training, Joint mobilization, and Scar mobilization  PLAN FOR NEXT SESSION: continue scar massage and decrease swelling in knee. Progress standing exercises for knee flexion strength in weight bearing.   Lurena Nida, PTA 02/10/2023, 9:32 AM    9:32 AM, 02/10/23

## 2023-02-12 ENCOUNTER — Ambulatory Visit (HOSPITAL_COMMUNITY): Payer: PPO

## 2023-02-12 ENCOUNTER — Encounter (HOSPITAL_COMMUNITY): Payer: PPO | Admitting: Physical Therapy

## 2023-02-12 DIAGNOSIS — M25661 Stiffness of right knee, not elsewhere classified: Secondary | ICD-10-CM

## 2023-02-12 DIAGNOSIS — M25561 Pain in right knee: Secondary | ICD-10-CM | POA: Diagnosis not present

## 2023-02-12 NOTE — Therapy (Addendum)
OUTPATIENT PHYSICAL THERAPY TREATMENT   Patient Name: Ethan Oliver MRN: [id number] DOB:1955-06-13, 67 y.o., male Today's Date: 02/12/2023  END OF SESSION:  PT End of Session - 02/12/23 1012     Visit Number 4    Number of Visits 16    Date for PT Re-Evaluation 02/19/23    Authorization Type [employer]    Progress Note Due on Visit 10    PT Start Time 1015    PT Stop Time 1100    PT Time Calculation (min) 45 min    Activity Tolerance Patient tolerated treatment well    Behavior During Therapy WFL for tasks assessed/performed             Past Medical History:  Diagnosis Date   Arthritis    Chronic back pain    Depression    Diabetes mellitus without complication (HCC)    History of gout    History of kidney stones    Hypercholesteremia    Hypertension    PONV (postoperative nausea and vomiting)    Past Surgical History:  Procedure Laterality Date   ACHILLES TENDON SURGERY Left 12/29/2013   Procedure: PERCUTANEOUS TENDO-ACHILLES LENGTHENING;  Surgeon: Dallas Schimke, DPM;  Location: [facility];  Service: Podiatry;  Laterality: Left;   CALCANEAL OSTEOTOMY WITH ILIAC CREST BONE GRAFT AND REPAIR SUBLEXING TENDON AND ACHILLES TENDON Left 12/29/2013   Procedure: CALCANEOCUBOID JOINT FUSION PERFORMED WITH BONE GRAFT;  Surgeon: Dallas Schimke, DPM;  Location: [facility];  Service: Podiatry;  Laterality: Left;   CARPAL TUNNEL RELEASE Bilateral    COLONOSCOPY WITH PROPOFOL N/A 05/02/2021   Procedure: COLONOSCOPY WITH PROPOFOL;  Surgeon: Malissa Hippo, MD;  Location: [facility];  Service: Endoscopy;  Laterality: N/A;  930   Disk fusion     lumbar   FLEXOR TENOTOMY  Right 02/18/2018   Procedure: PERCUTANEOUS FLEXOR TENOTOMY/APPLICATION OF BK POSTERIOR SPLINT RLE;  Surgeon: Ferman Hamming, DPM;  Location: [facility];  Service: Podiatry;  Laterality: Right;   FOOT ARTHRODESIS Left 12/29/2013   Procedure: TRIPLE ARTHRODESIS FOOT;  Surgeon: Dallas Schimke, DPM;  Location: [facility];  Service: Podiatry;  Laterality: Left;   FOOT ARTHRODESIS Right 02/18/2018   Procedure: TRIPLE ARTHRODESIS RIGHT FOOT;  Surgeon: Ferman Hamming, DPM;  Location: [facility];  Service: Podiatry;  Laterality: Right;   KNEE SURGERY Right    Surgery on this knee twice.    POLYPECTOMY  05/02/2021   Procedure: POLYPECTOMY INTESTINAL;  Surgeon: Malissa Hippo, MD;  Location: [facility];  Service: Endoscopy;;   TOTAL KNEE ARTHROPLASTY Left 04/23/2022   Procedure: TOTAL KNEE ARTHROPLASTY;  Surgeon: Vickki Hearing, MD;  Location: [facility];  Service: Orthopedics;  Laterality: Left;   TOTAL KNEE ARTHROPLASTY Right 01/15/2023   Procedure: TOTAL KNEE ARTHROPLASTY;  Surgeon: Vickki Hearing, MD;  Location: [facility];  Service: Orthopedics;  Laterality: Right;   Patient Active Problem List   Diagnosis Date Noted   Status post total right knee replacement 01/15/23 01/31/2023   Primary osteoarthritis of right knee 01/15/2023   Osteoarthritis of right knee 01/15/2023   S/P TKR (total knee replacement), left 04/23/22 05/07/2022   Primary osteoarthritis of left knee 04/23/2022   Mood disorder (HCC) 09/12/2021   Postlaminectomy syndrome of lumbar region 09/12/2021   Arthritis of left elbow 06/21/2019   Cubital tunnel syndrome on left 06/21/2019   Inflammation of left elbow 06/21/2019   Arthritis, lumbar spine 08/05/2018   Benign essential hypertension 11/07/2016  Generalized osteoarthritis of multiple sites 11/07/2016   Type 2 diabetes mellitus without complication, without long-term current use of insulin (HCC) 11/07/2016   Diastasis recti 11/07/2016   Pure hypercholesterolemia 11/07/2016   Morbid obesity with BMI of 40.0-44.9, adult (HCC) 12/27/2013   Back pain 12/27/2013   Degeneration of lumbar intervertebral disc 12/27/2013   Lumbar radiculopathy 12/27/2013   Lumbosacral spondylosis without myelopathy 12/28/2012    PCP: Lindaann Slough, DO  REFERRING  PROVIDER: Vickki Hearing, MD  REFERRING DIAG: M17.11 (ICD-10-CM) - Unilateral primary osteoarthritis, right knee  THERAPY DIAG:  Stiffness of right knee, not elsewhere classified  Acute pain of right knee  Rationale for Evaluation and Treatment: Rehabilitation  ONSET DATE: surgery: 01/15/2023  SUBJECTIVE:   SUBJECTIVE STATEMENT: Pt states his knee is stiff this morning. Has trouble lying comfortably on his right side.  Patient arrives to the clinic accompanied by his wife. Had home health PT since being discharged from the hospital. Had a swelling flare up on 01/21/23. Able to see MD on 11/14 to rule out DVT; was put on Eliquis. Patient is currently retired. He has been able to go up and down steps with little problem other than going slowly. Uses his CPM once a day. Prefers to walk laps in his house 10-15x a day. Not currently having any trouble besides the bandage. Able to climb the 13 steps in his house yesterday so he could take a shower. Had no issues with bathing. Feels stiff and tight in the morning; mostly relieves after moving.  Knows what to expect for this rehab since he had his left knee replaced in February.  Has pain meds PRN; does not often feel the need to use them.  PERTINENT HISTORY: 01/23/2023: pt went to doctor for R swollen calf and aching pain that made it hard for him to tolerate his compression hose. Noted decreased knee flexion from 90 degrees to 45 over 3 days. Ultra sound ruled out DVT. Meds changed from Aspirin to Eliquis. PAIN:  Are you having pain? Yes: NPRS scale: 4-5/10 Pain location: anterior knee Pain description: stiff and tight Aggravating factors: rest, immobility Relieving factors: movement   PRECAUTIONS: None   WEIGHT BEARING RESTRICTIONS: No  FALLS:  Has patient fallen in last 6 months? No  LIVING ENVIRONMENT: Lives with: lives with their spouse Lives in: House/apartment Stairs: Yes: Internal: 13 steps; on right going up and none and  External: 2 steps; none Has following equipment at home: None  OCCUPATION: Retired, occasionally does   PLOF: Independent with basic ADLs  PATIENT GOALS: Walk without limitations; go where he pleases.  NEXT MD VISIT: 01/31/2023 - remove staples and bandage   OBJECTIVE:  Note: Objective measures were completed at Evaluation unless otherwise noted.  DIAGNOSTIC FINDINGS:  PATIENT SURVEYS:  FOTO 31  COGNITION: Overall cognitive status: Within functional limits for tasks assessed     SENSATION: WFL  POSTURE: rounded shoulders and forward head  PALPATION: Tender over inferior-medial knee / tibial plateau. Tender over mid thigh   LOWER EXTREMITY ROM:  Active ROM Right eval Left eval  Hip flexion    Hip extension    Hip abduction    Hip adduction    Hip internal rotation    Hip external rotation    Knee flexion 89   Knee extension -2   Ankle dorsiflexion    Ankle plantarflexion    Ankle inversion    Ankle eversion     (Blank rows = not tested)  LOWER EXTREMITY MMT:  MMT Right eval Left eval  Hip flexion    Hip extension    Hip abduction    Hip adduction    Hip internal rotation    Hip external rotation    Knee flexion    Knee extension    Ankle dorsiflexion    Ankle plantarflexion    Ankle inversion    Ankle eversion     (Blank rows = not tested)   FUNCTIONAL TESTS:  5 times sit to stand: 24.2 sec 2 minute walk test: 228 feet  GAIT: Distance walked: 350 feet in clinic Assistive device utilized: None Level of assistance: Complete Independence Comments: Walks with externally rotated and excessively pronated feet bilaterally. Decreased stride length and moderately increased lateral trunk sway.    TODAY'S TREATMENT:                                                                                                                              DATE:  02/12/23 Supine: Moist heat to R knee 5' Patellar mobilization in all directions  Quad set x10 SAQ  with 2# AW x15  SAQ into SLR with 2# AW x15 Heel slides AAROM with strap x10  Seated: EOB contract-relax knee flexion and distraction 5' Manual resistance during knee flexion and extension x15  Recumbent bike, 5', seat 16  4" step ups x15, right foot stays on step 7" step ups x15, right foot stays on step Standing knee flexion stretch 12" step 10X10"   02/10/23 Bike seat 16 rocking 5 minutes Standing knee flexion stretch 12" step 10X10"  Rt knee flexion 15X  Hamstring stretch 10X10" Supine:  SLR 10X  Heelslides 10X before and after manual Manual to Rt knee including MFR and retro massage AROM before manual 100 degrees, following manual 105 degrees  02/05/23 Goal review Bike seat  rocking 5 minutes Standing:  knee flexion stretch on 12" step 10X10"  Rt knee flexion 10X each Supine:  scar massage, retro following for edema AROM Rt knee 103 degrees  01/29/2023: Evaluation and HEP education  PATIENT EDUCATION:  Education details: Patient educated on POC, scope of PT, HEP, and swelling management strategies. Person educated: Patient Education method: Explanation, Demonstration, and Handouts Education comprehension: verbalized understanding, returned demonstration, verbal cues required, and tactile cues required   HOME EXERCISE PROGRAM: Access Code: [email or web address]  URL: [email or web address]  Date: 01/29/2023 Prepared by: [facility] - Rehab Exercises -  Supine Ankle Pumps  - 2 x daily - 7 x weekly - 1 sets - 15 reps  Supine Quadricep Sets  - 2 x daily - 7 x weekly - 1 sets - 10 reps  Supine Active Straight Leg Raise  - 2 x daily - 7 x weekly - 1 sets - 10 reps  Supine Short Arc Quad  - 2 x daily - 7 x weekly - 1 sets - 15 reps  Supine Heel Slides  - 2 x daily - 7 x  weekly - 1 sets - 15 reps  Seated Long Arc Quad  - 2 x daily - 7 x weekly - 1 sets - 15 reps  Seated Knee Flexion Stretch  - 2 x daily - 7 x weekly - 1 sets - 15 reps   ASSESSMENT:  CLINICAL  IMPRESSION: Started session with moist heat to decrease stiffness and improve tissue extensibility. Manual patellar mobilizations reveled good medial and lateral glides; moderate restriction with superior glide. Reports wife is massaging knee at home in addition to help improve elasticity and reduce scar tissue. Healing well. Patient demonstrates good muscular endurance with SLR; minimal extension lag with fatigue. Recumbent bike semi circles provided a good stretch in each direction but still unable to make full revolutions.  Stair training today focused on progressing from a step-to pattern on 7" stairs to a step-through pattern on 4" stairs.  Patient will continue to benefit from physical therapy services to promote return to optimal function, increase ROM, and return to independence.   OBJECTIVE IMPAIRMENTS: decreased mobility, decreased ROM, and obesity.   ACTIVITY LIMITATIONS: lifting, sitting, squatting, and locomotion level  PARTICIPATION LIMITATIONS: driving  PERSONAL FACTORS: Fitness is also affecting patient's functional outcome.   REHAB POTENTIAL: Excellent  CLINICAL DECISION MAKING: Stable/uncomplicated  EVALUATION COMPLEXITY: Low   GOALS: Goals reviewed with patient? No  SHORT TERM GOALS: Target date: 02/19/2023 Patient will be independent with initial HEP to improve self management strategies. Baseline: Goal status: INITIAL  2.  Patient will be able to perform a sit-to-stand and stand-to-sit transfer without the use of his hands in order to demonstrate sufficient functional strength of R LE. Baseline:  Goal status: INITIAL  3.  Patient will be able to flex knee to >115 degrees to increase functional ROM and mobility.  Baseline:  Goal status: INITIAL   LONG TERM GOALS: Target date: 03/12/2023  By discharge patient will improve 2 MWT to >374ft in order to demonstrate improved functional ambulatory capacity in community setting.       Baseline: 228 feet Goal status:  INITIAL  2.  By discharge, patient will increase FOTO score by >15 points in order to improve functional capacity and tolerance to daily activity.  Baseline:  Goal status: INITIAL  3.  Patient will report <2/10 pain while performing functional recreational activities for > 45 minutes in order to improve endurance capacity. Baseline: 4-5/10 Goal status: INITIAL  4.  Patient will be able to drive a car in order to promote participation and independence. Baseline:  Goal status: INITIAL   5.  Patient will improve 5x STS time to <15 sec in order to demonstrate improved functional ambulatory capacity in community setting. Baseline:  Goal status: INITIAL  PLAN:  PT FREQUENCY: 3x/week  PT DURATION: 6 weeks  PLANNED INTERVENTIONS: 97164- PT Re-evaluation, 97110-Therapeutic exercises, 97530- Therapeutic activity, 97112- Neuromuscular re-education, 97535- Self Care, [id number]- Manual therapy, [phone]- Gait training, Balance training, Stair training, Joint mobilization, and Scar mobilization  PLAN FOR NEXT SESSION: continue scar massage and decrease swelling in knee. Progress standing exercises for knee flexion strength in weight bearing.   Chas Kazaria Gaertner, Student-PT 02/12/2023, 10:18 AM  " I agree with the following treatment note after reviewing documentation. This session was performed under the supervision of a licensed clinician."  11:09 AM, 02/12/23 Amy Small Lynch MPT [facility] [place] [phone]

## 2023-02-14 ENCOUNTER — Ambulatory Visit (INDEPENDENT_AMBULATORY_CARE_PROVIDER_SITE_OTHER): Payer: PPO | Admitting: Orthopedic Surgery

## 2023-02-14 ENCOUNTER — Encounter (HOSPITAL_COMMUNITY): Payer: PPO

## 2023-02-14 DIAGNOSIS — M1711 Unilateral primary osteoarthritis, right knee: Secondary | ICD-10-CM

## 2023-02-14 DIAGNOSIS — Z96651 Presence of right artificial knee joint: Secondary | ICD-10-CM

## 2023-02-14 NOTE — Progress Notes (Signed)
Office Visit Note   Patient: Ethan Oliver           Date of Birth: [birth year]           MRN: [id number] Visit Date: 02/14/2023 Requested by: Ethan Slough, DO [address] [place],  [place] [address] PCP: Ethan Slough, DO   Chief Complaint  Patient presents with   Follow-up    01/15/23 RT TKA    Allergies  Allergen Reactions   Codeine Nausea Only   Encounter Diagnoses  Name Primary?   Status post total right knee replacement 01/15/23 Yes   Unilateral primary osteoarthritis, right knee    There is no height or weight on file to calculate BMI.  PROCEDURE: -this is a right tka   IMAGING: No results found.   ASSESSMENT AND PLAN:  Ethan Oliver is doing well most of his swelling is gone down extremities superactive his incision looks good his PT notes indicate 110 degrees of knee flexion he is walking without any support  Continue therapy follow-up in 1 month

## 2023-02-17 ENCOUNTER — Ambulatory Visit (HOSPITAL_COMMUNITY): Payer: PPO

## 2023-02-17 ENCOUNTER — Encounter (HOSPITAL_COMMUNITY): Payer: Self-pay

## 2023-02-17 DIAGNOSIS — M25561 Pain in right knee: Secondary | ICD-10-CM

## 2023-02-17 DIAGNOSIS — R2689 Other abnormalities of gait and mobility: Secondary | ICD-10-CM

## 2023-02-17 DIAGNOSIS — M25661 Stiffness of right knee, not elsewhere classified: Secondary | ICD-10-CM

## 2023-02-17 NOTE — Therapy (Addendum)
OUTPATIENT PHYSICAL THERAPY TREATMENT   Patient Name: Ethan Oliver MRN: [id number] DOB:1955-07-13, 67 y.o., male Today's Date: 02/17/2023  END OF SESSION:  PT End of Session - 02/17/23 0934     Visit Number 5    Number of Visits 16    Date for PT Re-Evaluation 02/19/23    Authorization Type [employer]    Progress Note Due on Visit 10    PT Start Time 0934    PT Stop Time 1015    PT Time Calculation (min) 41 min    Activity Tolerance Patient tolerated treatment well    Behavior During Therapy WFL for tasks assessed/performed             Past Medical History:  Diagnosis Date   Arthritis    Chronic back pain    Depression    Diabetes mellitus without complication (HCC)    History of gout    History of kidney stones    Hypercholesteremia    Hypertension    PONV (postoperative nausea and vomiting)    Past Surgical History:  Procedure Laterality Date   ACHILLES TENDON SURGERY Left 12/29/2013   Procedure: PERCUTANEOUS TENDO-ACHILLES LENGTHENING;  Surgeon: Dallas Schimke, DPM;  Location: [facility];  Service: Podiatry;  Laterality: Left;   CALCANEAL OSTEOTOMY WITH ILIAC CREST BONE GRAFT AND REPAIR SUBLEXING TENDON AND ACHILLES TENDON Left 12/29/2013   Procedure: CALCANEOCUBOID JOINT FUSION PERFORMED WITH BONE GRAFT;  Surgeon: Dallas Schimke, DPM;  Location: [facility];  Service: Podiatry;  Laterality: Left;   CARPAL TUNNEL RELEASE Bilateral    COLONOSCOPY WITH PROPOFOL N/A 05/02/2021   Procedure: COLONOSCOPY WITH PROPOFOL;  Surgeon: Malissa Hippo, MD;  Location: [facility];  Service: Endoscopy;  Laterality: N/A;  930   Disk fusion     lumbar   FLEXOR TENOTOMY  Right 02/18/2018   Procedure: PERCUTANEOUS FLEXOR TENOTOMY/APPLICATION OF BK POSTERIOR SPLINT RLE;  Surgeon: Ferman Hamming, DPM;  Location: [facility];  Service: Podiatry;  Laterality: Right;   FOOT ARTHRODESIS Left 12/29/2013   Procedure: TRIPLE ARTHRODESIS FOOT;  Surgeon: Dallas Schimke, DPM;  Location: [facility];  Service: Podiatry;  Laterality: Left;   FOOT ARTHRODESIS Right 02/18/2018   Procedure: TRIPLE ARTHRODESIS RIGHT FOOT;  Surgeon: Ferman Hamming, DPM;  Location: [facility];  Service: Podiatry;  Laterality: Right;   KNEE SURGERY Right    Surgery on this knee twice.    POLYPECTOMY  05/02/2021   Procedure: POLYPECTOMY INTESTINAL;  Surgeon: Malissa Hippo, MD;  Location: [facility];  Service: Endoscopy;;   TOTAL KNEE ARTHROPLASTY Left 04/23/2022   Procedure: TOTAL KNEE ARTHROPLASTY;  Surgeon: Vickki Hearing, MD;  Location: [facility];  Service: Orthopedics;  Laterality: Left;   TOTAL KNEE ARTHROPLASTY Right 01/15/2023   Procedure: TOTAL KNEE ARTHROPLASTY;  Surgeon: Vickki Hearing, MD;  Location: [facility];  Service: Orthopedics;  Laterality: Right;   Patient Active Problem List   Diagnosis Date Noted   Status post total right knee replacement 01/15/23 01/31/2023   Primary osteoarthritis of right knee 01/15/2023   Osteoarthritis of right knee 01/15/2023   S/P TKR (total knee replacement), left 04/23/22 05/07/2022   Primary osteoarthritis of left knee 04/23/2022   Mood disorder (HCC) 09/12/2021   Postlaminectomy syndrome of lumbar region 09/12/2021   Arthritis of left elbow 06/21/2019   Cubital tunnel syndrome on left 06/21/2019   Inflammation of left elbow 06/21/2019   Arthritis, lumbar spine 08/05/2018   Benign essential hypertension 11/07/2016  Generalized osteoarthritis of multiple sites 11/07/2016   Type 2 diabetes mellitus without complication, without long-term current use of insulin (HCC) 11/07/2016   Diastasis recti 11/07/2016   Pure hypercholesterolemia 11/07/2016   Morbid obesity with BMI of 40.0-44.9, adult (HCC) 12/27/2013   Back pain 12/27/2013   Degeneration of lumbar intervertebral disc 12/27/2013   Lumbar radiculopathy 12/27/2013   Lumbosacral spondylosis without myelopathy 12/28/2012    PCP: Lindaann Slough, DO  REFERRING  PROVIDER: Vickki Hearing, MD  REFERRING DIAG: M17.11 (ICD-10-CM) - Unilateral primary osteoarthritis, right knee  THERAPY DIAG:  Other abnormalities of gait and mobility  Acute pain of right knee  Stiffness of right knee, not elsewhere classified  Rationale for Evaluation and Treatment: Rehabilitation  ONSET DATE: surgery: 01/15/2023  SUBJECTIVE:   SUBJECTIVE STATEMENT: Patient felt sore in the afternoon after last session; was back to normal the next morning. States his knee is a little stiff this morning. Has been doing stretches and exercises. Notices some tightness and soreness at the top of his knee/quad which he attributes to scar tissues. Some pain in the knee behind patella. Tried using his elliptical for the first time over the weekend with no difficulty.   EVAL: Patient arrives to the clinic accompanied by his wife. Had home health PT since being discharged from the hospital. Had a swelling flare up on 01/21/23. Able to see MD on 11/14 to rule out DVT; was put on Eliquis. Patient is currently retired. He has been able to go up and down steps with little problem other than going slowly. Uses his CPM once a day. Prefers to walk laps in his house 10-15x a day. Not currently having any trouble besides the bandage. Able to climb the 13 steps in his house yesterday so he could take a shower. Had no issues with bathing. Feels stiff and tight in the morning; mostly relieves after moving.  Knows what to expect for this rehab since he had his left knee replaced in February.  Has pain meds PRN; does not often feel the need to use them.  PERTINENT HISTORY: 01/23/2023: pt went to doctor for R swollen calf and aching pain that made it hard for him to tolerate his compression hose. Noted decreased knee flexion from 90 degrees to 45 over 3 days. Ultra sound ruled out DVT. Meds changed from Aspirin to Eliquis. PAIN:  Are you having pain? Yes: NPRS scale: 4-5/10 Pain location: anterior  knee Pain description: stiff and tight Aggravating factors: rest, immobility Relieving factors: movement   PRECAUTIONS: None   WEIGHT BEARING RESTRICTIONS: No  FALLS:  Has patient fallen in last 6 months? No  LIVING ENVIRONMENT: Lives with: lives with their spouse Lives in: House/apartment Stairs: Yes: Internal: 13 steps; on right going up and none and External: 2 steps; none Has following equipment at home: None  OCCUPATION: Retired, occasionally does   PLOF: Independent with basic ADLs  PATIENT GOALS: Walk without limitations; go where he pleases.  NEXT MD VISIT: 01/31/2023 - remove staples and bandage   OBJECTIVE:  Note: Objective measures were completed at Evaluation unless otherwise noted.  DIAGNOSTIC FINDINGS:  PATIENT SURVEYS:  FOTO 30  COGNITION: Overall cognitive status: Within functional limits for tasks assessed     SENSATION: WFL  POSTURE: rounded shoulders and forward head  PALPATION: Tender over inferior-medial knee / tibial plateau. Tender over mid thigh   LOWER EXTREMITY ROM:  Active ROM Right eval Left eval  Hip flexion    Hip extension  Hip abduction    Hip adduction    Hip internal rotation    Hip external rotation    Knee flexion 89   Knee extension -2   Ankle dorsiflexion    Ankle plantarflexion    Ankle inversion    Ankle eversion     (Blank rows = not tested)  LOWER EXTREMITY MMT:  MMT Right eval Left eval  Hip flexion    Hip extension    Hip abduction    Hip adduction    Hip internal rotation    Hip external rotation    Knee flexion    Knee extension    Ankle dorsiflexion    Ankle plantarflexion    Ankle inversion    Ankle eversion     (Blank rows = not tested)   FUNCTIONAL TESTS:  5 times sit to stand: 24.2 sec 2 minute walk test: 228 feet  GAIT: Distance walked: 350 feet in clinic Assistive device utilized: None Level of assistance: Complete Independence Comments: Walks with externally rotated and  excessively pronated feet bilaterally. Decreased stride length and moderately increased lateral trunk sway.    TODAY'S TREATMENT:                                                                                                                              DATE:   02/17/23 Supine: Moist heat to R knee 5' Patellar mobilization in all directions  Quad set x 10 SAQ with 4# AW x15  SAQ into SLR with 4# AW 2 x10 Heel slides AAROM with strap x10  7" step ups 2 x15, right foot stays on step Standing knee flexion stretch 12" step 10 X10"  Leg Press, single leg, plate 4, 2 x 8 Cybex HS curl, double leg, plate 6, 2 x 12 Recumbent bike, 5', seat 16   02/12/23 Supine: Moist heat to R knee 5' Patellar mobilization in all directions  Quad set x10 SAQ with 2# AW x15  SAQ into SLR with 2# AW x15 Heel slides AAROM with strap x10  Seated: EOB contract-relax knee flexion and distraction 5' Manual resistance during knee flexion and extension x15  Recumbent bike, 5', seat 16  4" step ups x15, right foot stays on step 7" step ups x15, right foot stays on step Standing knee flexion stretch 12" step 10X10"   02/10/23 Bike seat 16 rocking 5 minutes Standing knee flexion stretch 12" step 10X10"  Rt knee flexion 15X  Hamstring stretch 10X10" Supine:  SLR 10X  Heelslides 10X before and after manual Manual to Rt knee including MFR and retro massage AROM before manual 100 degrees, following manual 105 degrees  02/05/23 Goal review Bike seat  rocking 5 minutes Standing:  knee flexion stretch on 12" step 10X10"  Rt knee flexion 10X each Supine:  scar massage, retro following for edema AROM Rt knee 103 degrees  01/29/2023: Evaluation and HEP education  PATIENT EDUCATION:  Education details: Patient educated  on POC, scope of PT, HEP, and swelling management strategies. Person educated: Patient Education method: Explanation, Demonstration, and Handouts Education comprehension: verbalized  understanding, returned demonstration, verbal cues required, and tactile cues required   HOME EXERCISE PROGRAM: Access Code: [email or web address]  URL: [email or web address]  Date: 01/29/2023 Prepared by: [facility] - Rehab Exercises -  Supine Ankle Pumps  - 2 x daily - 7 x weekly - 1 sets - 15 reps  Supine Quadricep Sets  - 2 x daily - 7 x weekly - 1 sets - 10 reps  Supine Active Straight Leg Raise  - 2 x daily - 7 x weekly - 1 sets - 10 reps  Supine Short Arc Quad  - 2 x daily - 7 x weekly - 1 sets - 15 reps  Supine Heel Slides  - 2 x daily - 7 x weekly - 1 sets - 15 reps  Seated Long Arc Quad  - 2 x daily - 7 x weekly - 1 sets - 15 reps  Seated Knee Flexion Stretch  - 2 x daily - 7 x weekly - 1 sets - 15 reps   ASSESSMENT:  CLINICAL IMPRESSION: Started session with moist heat to decrease stiffness and improve tissue extensibility. Manual patellar mobilizations reveled good medial and lateral glides; moderate restriction with superior glide. Superior scar over distal quad is restricted more than other parts of scar. Patient demonstrates good muscular endurance with SLR; minimal extension lag with fatigue. Introduction of HS curl and leg press tolerated well without any increase in pain. Recumbent bike semi circles provided a good stretch in each direction; able to do full revolutions today. Overall, patient's strength, ROM, and tolerance to activity is progressing really well. Still limited with knee flexion. Patient will continue to benefit from physical therapy services to promote return to optimal function, increase ROM, and return to independence.   OBJECTIVE IMPAIRMENTS: decreased mobility, decreased ROM, and obesity.   ACTIVITY LIMITATIONS: lifting, sitting, squatting, and locomotion level  PARTICIPATION LIMITATIONS: driving  PERSONAL FACTORS: Fitness is also affecting patient's functional outcome.   REHAB POTENTIAL: Excellent  CLINICAL DECISION MAKING:  Stable/uncomplicated  EVALUATION COMPLEXITY: Low   GOALS: Goals reviewed with patient? No  SHORT TERM GOALS: Target date: 02/19/2023 Patient will be independent with initial HEP to improve self management strategies. Baseline: Goal status: INITIAL  2.  Patient will be able to perform a sit-to-stand and stand-to-sit transfer without the use of his hands in order to demonstrate sufficient functional strength of R LE. Baseline:  Goal status: INITIAL  3.  Patient will be able to flex knee to >115 degrees to increase functional ROM and mobility.  Baseline:  Goal status: INITIAL   LONG TERM GOALS: Target date: 03/12/2023  By discharge patient will improve 2 MWT to >347ft in order to demonstrate improved functional ambulatory capacity in community setting.       Baseline: 228 feet Goal status: INITIAL  2.  By discharge, patient will increase FOTO score by >15 points in order to improve functional capacity and tolerance to daily activity.  Baseline:  Goal status: INITIAL  3.  Patient will report <2/10 pain while performing functional recreational activities for > 45 minutes in order to improve endurance capacity. Baseline: 4-5/10 Goal status: INITIAL  4.  Patient will be able to drive a car in order to promote participation and independence. Baseline:  Goal status: INITIAL   5.  Patient will improve 5x STS time to <15 sec in order to demonstrate improved functional ambulatory  capacity in community setting. Baseline:  Goal status: INITIAL  PLAN:  PT FREQUENCY: 3x/week  PT DURATION: 6 weeks  PLANNED INTERVENTIONS: 97164- PT Re-evaluation, 97110-Therapeutic exercises, 97530- Therapeutic activity, 97112- Neuromuscular re-education, 97535- Self Care, [id number]- Manual therapy, [phone]- Gait training, Balance training, Stair training, Joint mobilization, and Scar mobilization  PLAN FOR NEXT SESSION: Progress standing exercises for knee flexion strength in weight bearing. Introduce  balance and hip exercises.    Chas Maycee Blasco, Student-PT 02/17/2023, 10:24 AM  " I agree with the following treatment note after reviewing documentation. This session was performed under the supervision of a licensed clinician."  10:24 AM, 02/17/23 Amy Small Lynch MPT [facility] [place] [phone]

## 2023-02-19 ENCOUNTER — Encounter (HOSPITAL_COMMUNITY): Payer: PPO

## 2023-02-21 ENCOUNTER — Ambulatory Visit (HOSPITAL_COMMUNITY): Payer: PPO

## 2023-02-21 DIAGNOSIS — M25561 Pain in right knee: Secondary | ICD-10-CM

## 2023-02-21 DIAGNOSIS — M25661 Stiffness of right knee, not elsewhere classified: Secondary | ICD-10-CM

## 2023-02-21 NOTE — Therapy (Addendum)
OUTPATIENT PHYSICAL THERAPY TREATMENT   Patient Name: DHANI FAHRER MRN: [id number] DOB:Oct 04, 1955, 67 y.o., male Today's Date: 02/21/2023  END OF SESSION:  PT End of Session - 02/21/23 0918     Visit Number 6    Number of Visits 16    Date for PT Re-Evaluation 02/19/23    Authorization Type [employer]    Progress Note Due on Visit 10    PT Start Time 0920    PT Stop Time 1005    PT Time Calculation (min) 45 min    Activity Tolerance Patient tolerated treatment well    Behavior During Therapy WFL for tasks assessed/performed             Past Medical History:  Diagnosis Date   Arthritis    Chronic back pain    Depression    Diabetes mellitus without complication (HCC)    History of gout    History of kidney stones    Hypercholesteremia    Hypertension    PONV (postoperative nausea and vomiting)    Past Surgical History:  Procedure Laterality Date   ACHILLES TENDON SURGERY Left 12/29/2013   Procedure: PERCUTANEOUS TENDO-ACHILLES LENGTHENING;  Surgeon: Dallas Schimke, DPM;  Location: [facility];  Service: Podiatry;  Laterality: Left;   CALCANEAL OSTEOTOMY WITH ILIAC CREST BONE GRAFT AND REPAIR SUBLEXING TENDON AND ACHILLES TENDON Left 12/29/2013   Procedure: CALCANEOCUBOID JOINT FUSION PERFORMED WITH BONE GRAFT;  Surgeon: Dallas Schimke, DPM;  Location: [facility];  Service: Podiatry;  Laterality: Left;   CARPAL TUNNEL RELEASE Bilateral    COLONOSCOPY WITH PROPOFOL N/A 05/02/2021   Procedure: COLONOSCOPY WITH PROPOFOL;  Surgeon: Malissa Hippo, MD;  Location: [facility];  Service: Endoscopy;  Laterality: N/A;  930   Disk fusion     lumbar   FLEXOR TENOTOMY  Right 02/18/2018   Procedure: PERCUTANEOUS FLEXOR TENOTOMY/APPLICATION OF BK POSTERIOR SPLINT RLE;  Surgeon: Ferman Hamming, DPM;  Location: [facility];  Service: Podiatry;  Laterality: Right;   FOOT ARTHRODESIS Left 12/29/2013   Procedure: TRIPLE ARTHRODESIS FOOT;  Surgeon: Dallas Schimke, DPM;  Location: [facility];  Service: Podiatry;  Laterality: Left;   FOOT ARTHRODESIS Right 02/18/2018   Procedure: TRIPLE ARTHRODESIS RIGHT FOOT;  Surgeon: Ferman Hamming, DPM;  Location: [facility];  Service: Podiatry;  Laterality: Right;   KNEE SURGERY Right    Surgery on this knee twice.    POLYPECTOMY  05/02/2021   Procedure: POLYPECTOMY INTESTINAL;  Surgeon: Malissa Hippo, MD;  Location: [facility];  Service: Endoscopy;;   TOTAL KNEE ARTHROPLASTY Left 04/23/2022   Procedure: TOTAL KNEE ARTHROPLASTY;  Surgeon: Vickki Hearing, MD;  Location: [facility];  Service: Orthopedics;  Laterality: Left;   TOTAL KNEE ARTHROPLASTY Right 01/15/2023   Procedure: TOTAL KNEE ARTHROPLASTY;  Surgeon: Vickki Hearing, MD;  Location: [facility];  Service: Orthopedics;  Laterality: Right;   Patient Active Problem List   Diagnosis Date Noted   Status post total right knee replacement 01/15/23 01/31/2023   Primary osteoarthritis of right knee 01/15/2023   Osteoarthritis of right knee 01/15/2023   S/P TKR (total knee replacement), left 04/23/22 05/07/2022   Primary osteoarthritis of left knee 04/23/2022   Mood disorder (HCC) 09/12/2021   Postlaminectomy syndrome of lumbar region 09/12/2021   Arthritis of left elbow 06/21/2019   Cubital tunnel syndrome on left 06/21/2019   Inflammation of left elbow 06/21/2019   Arthritis, lumbar spine 08/05/2018   Benign essential hypertension 11/07/2016  Generalized osteoarthritis of multiple sites 11/07/2016   Type 2 diabetes mellitus without complication, without long-term current use of insulin (HCC) 11/07/2016   Diastasis recti 11/07/2016   Pure hypercholesterolemia 11/07/2016   Morbid obesity with BMI of 40.0-44.9, adult (HCC) 12/27/2013   Back pain 12/27/2013   Degeneration of lumbar intervertebral disc 12/27/2013   Lumbar radiculopathy 12/27/2013   Lumbosacral spondylosis without myelopathy 12/28/2012    PCP: Lindaann Slough, DO  REFERRING  PROVIDER: Vickki Hearing, MD  REFERRING DIAG: M17.11 (ICD-10-CM) - Unilateral primary osteoarthritis, right knee  THERAPY DIAG:  Acute pain of right knee  Stiffness of right knee, not elsewhere classified  Rationale for Evaluation and Treatment: Rehabilitation  ONSET DATE: surgery: 01/15/2023  SUBJECTIVE:   SUBJECTIVE STATEMENT: Patient felt sore in the afternoon after last session; was back to normal the next morning. States his knee is a little stiff this morning. Has been doing stretches and exercises. Using his elliptical with no difficulty.   EVAL: Patient arrives to the clinic accompanied by his wife. Had home health PT since being discharged from the hospital. Had a swelling flare up on 01/21/23. Able to see MD on 11/14 to rule out DVT; was put on Eliquis. Patient is currently retired. He has been able to go up and down steps with little problem other than going slowly. Uses his CPM once a day. Prefers to walk laps in his house 10-15x a day. Not currently having any trouble besides the bandage. Able to climb the 13 steps in his house yesterday so he could take a shower. Had no issues with bathing. Feels stiff and tight in the morning; mostly relieves after moving.  Knows what to expect for this rehab since he had his left knee replaced in February.  Has pain meds PRN; does not often feel the need to use them.  PERTINENT HISTORY: 01/23/2023: pt went to doctor for R swollen calf and aching pain that made it hard for him to tolerate his compression hose. Noted decreased knee flexion from 90 degrees to 45 over 3 days. Ultra sound ruled out DVT. Meds changed from Aspirin to Eliquis. PAIN:  Are you having pain? Yes: NPRS scale: 4-5/10 Pain location: anterior knee Pain description: stiff and tight Aggravating factors: rest, immobility Relieving factors: movement   PRECAUTIONS: None   WEIGHT BEARING RESTRICTIONS: No  FALLS:  Has patient fallen in last 6 months? No  LIVING  ENVIRONMENT: Lives with: lives with their spouse Lives in: House/apartment Stairs: Yes: Internal: 13 steps; on right going up and none and External: 2 steps; none Has following equipment at home: None  OCCUPATION: Retired, occasionally does   PLOF: Independent with basic ADLs  PATIENT GOALS: Walk without limitations; go where he pleases.  NEXT MD VISIT: 01/31/2023 - remove staples and bandage   OBJECTIVE:  Note: Objective measures were completed at Evaluation unless otherwise noted.  DIAGNOSTIC FINDINGS:  PATIENT SURVEYS:  FOTO 44  COGNITION: Overall cognitive status: Within functional limits for tasks assessed     SENSATION: WFL  POSTURE: rounded shoulders and forward head  PALPATION: Tender over inferior-medial knee / tibial plateau. Tender over mid thigh   LOWER EXTREMITY ROM:  Active ROM Right eval Left eval  Hip flexion    Hip extension    Hip abduction    Hip adduction    Hip internal rotation    Hip external rotation    Knee flexion 89   Knee extension -2   Ankle dorsiflexion  Ankle plantarflexion    Ankle inversion    Ankle eversion     (Blank rows = not tested)  LOWER EXTREMITY MMT:  MMT Right eval Left eval  Hip flexion    Hip extension    Hip abduction    Hip adduction    Hip internal rotation    Hip external rotation    Knee flexion    Knee extension    Ankle dorsiflexion    Ankle plantarflexion    Ankle inversion    Ankle eversion     (Blank rows = not tested)   FUNCTIONAL TESTS:  5 times sit to stand: 24.2 sec 2 minute walk test: 228 feet  GAIT: Distance walked: 350 feet in clinic Assistive device utilized: None Level of assistance: Complete Independence Comments: Walks with externally rotated and excessively pronated feet bilaterally. Decreased stride length and moderately increased lateral trunk sway.    TODAY'S TREATMENT:                                                                                                                               DATE:   02/21/23 Recumbent bike, 5', seat 16 Cybex HS curl, single leg, plate 5, 2 x 12, 3 sec eccentric  Leg Press, single leg, plate 4, 2 x 8, 3 sec eccentric  Single leg march to hip Ext in parallel bars, 3# AW, 1 HHA, 2 x 15 7" step ups 2 x15, right foot stays on step Standing knee flexion stretch 12" step 5 X10" ROM measured at EOS: 108 (standing flexion on step)   02/17/23 Supine: Moist heat to R knee 5' Patellar mobilization in all directions  Quad set x 10 SAQ with 4# AW x15  SAQ into SLR with 4# AW 2 x10 Heel slides AAROM with strap x10  7" step ups 2 x15, right foot stays on step Standing knee flexion stretch 12" step 10 X10"  Leg Press, single leg, plate 4, 2 x 8 Cybex HS curl, double leg, plate 6, 2 x 12 Recumbent bike, 5', seat 16   02/12/23 Supine: Moist heat to R knee 5' Patellar mobilization in all directions  Quad set x10 SAQ with 2# AW x15  SAQ into SLR with 2# AW x15 Heel slides AAROM with strap x10  Seated: EOB contract-relax knee flexion and distraction 5' Manual resistance during knee flexion and extension x15  Recumbent bike, 5', seat 16  4" step ups x15, right foot stays on step 7" step ups x15, right foot stays on step Standing knee flexion stretch 12" step 10X10"   02/10/23 Bike seat 16 rocking 5 minutes Standing knee flexion stretch 12" step 10X10"  Rt knee flexion 15X  Hamstring stretch 10X10" Supine:  SLR 10X  Heelslides 10X before and after manual Manual to Rt knee including MFR and retro massage AROM before manual 100 degrees, following manual 105 degrees  02/05/23 Goal review Bike seat  rocking 5 minutes Standing:  knee flexion stretch on 12" step 10X10"  Rt knee flexion 10X each Supine:  scar massage, retro following for edema AROM Rt knee 103 degrees  01/29/2023: Evaluation and HEP education  PATIENT EDUCATION:  Education details: Patient educated on POC, scope of PT, HEP, and swelling  management strategies. Person educated: Patient Education method: Explanation, Demonstration, and Handouts Education comprehension: verbalized understanding, returned demonstration, verbal cues required, and tactile cues required   HOME EXERCISE PROGRAM: Access Code: [email or web address]  URL: [email or web address]  Date: 01/29/2023 Prepared by: [facility] - Rehab Exercises -  Supine Ankle Pumps  - 2 x daily - 7 x weekly - 1 sets - 15 reps  Supine Quadricep Sets  - 2 x daily - 7 x weekly - 1 sets - 10 reps  Supine Active Straight Leg Raise  - 2 x daily - 7 x weekly - 1 sets - 10 reps  Supine Short Arc Quad  - 2 x daily - 7 x weekly - 1 sets - 15 reps  Supine Heel Slides  - 2 x daily - 7 x weekly - 1 sets - 15 reps  Seated Long Arc Quad  - 2 x daily - 7 x weekly - 1 sets - 15 reps  Seated Knee Flexion Stretch  - 2 x daily - 7 x weekly - 1 sets - 15 reps   ASSESSMENT:  CLINICAL IMPRESSION: Started session with a warm up on the recumbent bike. Patient able to do full revolutions within the first minute. Continued to emphasize strength with the HS curl and leg press. Tolerated exercises well without any increase in pain. ROM measured at 108 degrees of flexion at the EOS. Overall, patient's strength, ROM, and tolerance to activity is progressing really well. Slightly limited with knee flexion. Patient will continue to benefit from physical therapy services to promote return to optimal function, increase ROM, and return to independence.   OBJECTIVE IMPAIRMENTS: decreased mobility, decreased ROM, and obesity.   ACTIVITY LIMITATIONS: lifting, sitting, squatting, and locomotion level  PARTICIPATION LIMITATIONS: driving  PERSONAL FACTORS: Fitness is also affecting patient's functional outcome.   REHAB POTENTIAL: Excellent  CLINICAL DECISION MAKING: Stable/uncomplicated  EVALUATION COMPLEXITY: Low   GOALS: Goals reviewed with patient? No  SHORT TERM GOALS: Target date: 02/19/2023 Patient  will be independent with initial HEP to improve self management strategies. Baseline: Goal status: INITIAL  2.  Patient will be able to perform a sit-to-stand and stand-to-sit transfer without the use of his hands in order to demonstrate sufficient functional strength of R LE. Baseline:  Goal status: INITIAL  3.  Patient will be able to flex knee to >115 degrees to increase functional ROM and mobility.  Baseline:  Goal status: INITIAL   LONG TERM GOALS: Target date: 03/12/2023  By discharge patient will improve 2 MWT to >342ft in order to demonstrate improved functional ambulatory capacity in community setting.       Baseline: 228 feet Goal status: INITIAL  2.  By discharge, patient will increase FOTO score by >15 points in order to improve functional capacity and tolerance to daily activity.  Baseline:  Goal status: INITIAL  3.  Patient will report <2/10 pain while performing functional recreational activities for > 45 minutes in order to improve endurance capacity. Baseline: 4-5/10 Goal status: INITIAL  4.  Patient will be able to drive a car in order to promote participation and independence. Baseline:  Goal status: INITIAL   5.  Patient will improve 5x STS time to <15 sec  in order to demonstrate improved functional ambulatory capacity in community setting. Baseline:  Goal status: INITIAL  PLAN:  PT FREQUENCY: 3x/week  PT DURATION: 6 weeks  PLANNED INTERVENTIONS: 97164- PT Re-evaluation, 97110-Therapeutic exercises, 97530- Therapeutic activity, 97112- Neuromuscular re-education, 97535- Self Care, 66440- Manual therapy, [phone]- Gait training, Balance training, Stair training, Joint mobilization, and Scar mobilization  PLAN FOR NEXT SESSION: Progress standing exercises for knee flexion strength in weight bearing. Introduce balance and hip exercises.    Chas Viraat Vanpatten, Student-PT 02/21/2023, 9:20 AM  " I agree with the following treatment note after reviewing documentation.  This session was performed under the supervision of a licensed clinician."  9:20 AM, 02/21/23 Amy Small Lynch MPT [facility] [place] [phone]

## 2023-02-24 ENCOUNTER — Encounter (HOSPITAL_COMMUNITY): Payer: PPO

## 2023-02-26 ENCOUNTER — Ambulatory Visit (HOSPITAL_COMMUNITY): Payer: PPO

## 2023-02-26 DIAGNOSIS — M25561 Pain in right knee: Secondary | ICD-10-CM | POA: Diagnosis not present

## 2023-02-26 DIAGNOSIS — M25661 Stiffness of right knee, not elsewhere classified: Secondary | ICD-10-CM

## 2023-02-26 DIAGNOSIS — R2689 Other abnormalities of gait and mobility: Secondary | ICD-10-CM

## 2023-02-26 NOTE — Therapy (Signed)
OUTPATIENT PHYSICAL THERAPY TREATMENT/DISCHARGE VISIT PHYSICAL THERAPY DISCHARGE SUMMARY  Visits from Start of Care: 7  Current functional level related to goals / functional outcomes: See below   Remaining deficits: See below   Education / Equipment: HEP   Patient agrees to discharge. Patient goals were partially met. Patient is being discharged due to the patient's request.    Patient Name: PARSA CARRETE MRN: [id number] DOB:1955-10-31, 67 y.o., male Today's Date: 02/26/2023  END OF SESSION:  PT End of Session - 02/26/23 0927     Visit Number 7    Number of Visits 16    Date for PT Re-Evaluation 03/12/23    Authorization Type [employer]    Progress Note Due on Visit 10    PT Start Time 0921    PT Stop Time 0948    PT Time Calculation (min) 27 min    Activity Tolerance Patient tolerated treatment well    Behavior During Therapy [facility] for tasks assessed/performed             Past Medical History:  Diagnosis Date   Arthritis    Chronic back pain    Depression    Diabetes mellitus without complication (HCC)    History of gout    History of kidney stones    Hypercholesteremia    Hypertension    PONV (postoperative nausea and vomiting)    Past Surgical History:  Procedure Laterality Date   ACHILLES TENDON SURGERY Left 12/29/2013   Procedure: PERCUTANEOUS TENDO-ACHILLES LENGTHENING;  Surgeon: Dallas Schimke, DPM;  Location: [facility];  Service: Podiatry;  Laterality: Left;   CALCANEAL OSTEOTOMY WITH ILIAC CREST BONE GRAFT AND REPAIR SUBLEXING TENDON AND ACHILLES TENDON Left 12/29/2013   Procedure: CALCANEOCUBOID JOINT FUSION PERFORMED WITH BONE GRAFT;  Surgeon: Dallas Schimke, DPM;  Location: [facility];  Service: Podiatry;  Laterality: Left;   CARPAL TUNNEL RELEASE Bilateral    COLONOSCOPY WITH PROPOFOL N/A 05/02/2021   Procedure: COLONOSCOPY WITH PROPOFOL;  Surgeon: Malissa Hippo, MD;  Location: [facility];  Service: Endoscopy;   Laterality: N/A;  930   Disk fusion     lumbar   FLEXOR TENOTOMY  Right 02/18/2018   Procedure: PERCUTANEOUS FLEXOR TENOTOMY/APPLICATION OF BK POSTERIOR SPLINT RLE;  Surgeon: Ferman Hamming, DPM;  Location: [facility];  Service: Podiatry;  Laterality: Right;   FOOT ARTHRODESIS Left 12/29/2013   Procedure: TRIPLE ARTHRODESIS FOOT;  Surgeon: Dallas Schimke, DPM;  Location: [facility];  Service: Podiatry;  Laterality: Left;   FOOT ARTHRODESIS Right 02/18/2018   Procedure: TRIPLE ARTHRODESIS RIGHT FOOT;  Surgeon: Ferman Hamming, DPM;  Location: [facility];  Service: Podiatry;  Laterality: Right;   KNEE SURGERY Right    Surgery on this knee twice.    POLYPECTOMY  05/02/2021   Procedure: POLYPECTOMY INTESTINAL;  Surgeon: Malissa Hippo, MD;  Location: [facility];  Service: Endoscopy;;   TOTAL KNEE ARTHROPLASTY Left 04/23/2022   Procedure: TOTAL KNEE ARTHROPLASTY;  Surgeon: Vickki Hearing, MD;  Location: [facility];  Service: Orthopedics;  Laterality: Left;   TOTAL KNEE ARTHROPLASTY Right 01/15/2023   Procedure: TOTAL KNEE ARTHROPLASTY;  Surgeon: Vickki Hearing, MD;  Location: [facility];  Service: Orthopedics;  Laterality: Right;   Patient Active Problem List   Diagnosis Date Noted   Status post total right knee replacement 01/15/23 01/31/2023   Primary osteoarthritis of right knee 01/15/2023   Osteoarthritis of right knee 01/15/2023   S/P TKR (total knee replacement), left 04/23/22  05/07/2022   Primary osteoarthritis of left knee 04/23/2022   Mood disorder (HCC) 09/12/2021   Postlaminectomy syndrome of lumbar region 09/12/2021   Arthritis of left elbow 06/21/2019   Cubital tunnel syndrome on left 06/21/2019   Inflammation of left elbow 06/21/2019   Arthritis, lumbar spine 08/05/2018   Benign essential hypertension 11/07/2016   Generalized osteoarthritis of multiple sites 11/07/2016   Type 2 diabetes mellitus without complication, without long-term current use of insulin (HCC)  11/07/2016   Diastasis recti 11/07/2016   Pure hypercholesterolemia 11/07/2016   Morbid obesity with BMI of 40.0-44.9, adult (HCC) 12/27/2013   Back pain 12/27/2013   Degeneration of lumbar intervertebral disc 12/27/2013   Lumbar radiculopathy 12/27/2013   Lumbosacral spondylosis without myelopathy 12/28/2012    PCP: Lindaann Slough, DO  REFERRING PROVIDER: Vickki Hearing, MD  REFERRING DIAG: M17.11 (ICD-10-CM) - Unilateral primary osteoarthritis, right knee  THERAPY DIAG:  Acute pain of right knee  Stiffness of right knee, not elsewhere classified  Other abnormalities of gait and mobility  Rationale for Evaluation and Treatment: Rehabilitation  ONSET DATE: surgery: 01/15/2023  SUBJECTIVE:   SUBJECTIVE STATEMENT: Medial joint line soreness only; 4/10; exercising at home; would like to discharge today to HEP.   EVAL: Patient arrives to the clinic accompanied by his wife. Had home health PT since being discharged from the hospital. Had a swelling flare up on 01/21/23. Able to see MD on 11/14 to rule out DVT; was put on Eliquis. Patient is currently retired. He has been able to go up and down steps with little problem other than going slowly. Uses his CPM once a day. Prefers to walk laps in his house 10-15x a day. Not currently having any trouble besides the bandage. Able to climb the 13 steps in his house yesterday so he could take a shower. Had no issues with bathing. Feels stiff and tight in the morning; mostly relieves after moving.  Knows what to expect for this rehab since he had his left knee replaced in February.  Has pain meds PRN; does not often feel the need to use them.  PERTINENT HISTORY: 01/23/2023: pt went to doctor for R swollen calf and aching pain that made it hard for him to tolerate his compression hose. Noted decreased knee flexion from 90 degrees to 45 over 3 days. Ultra sound ruled out DVT. Meds changed from Aspirin to Eliquis. PAIN:  Are you having  pain? Yes: NPRS scale: 4-5/10 Pain location: anterior knee Pain description: stiff and tight Aggravating factors: rest, immobility Relieving factors: movement   PRECAUTIONS: None   WEIGHT BEARING RESTRICTIONS: No  FALLS:  Has patient fallen in last 6 months? No  LIVING ENVIRONMENT: Lives with: lives with their spouse Lives in: House/apartment Stairs: Yes: Internal: 13 steps; on right going up and none and External: 2 steps; none Has following equipment at home: None  OCCUPATION: Retired, occasionally does   PLOF: Independent with basic ADLs  PATIENT GOALS: Walk without limitations; go where he pleases.  NEXT MD VISIT: 01/31/2023 - remove staples and bandage   OBJECTIVE:  Note: Objective measures were completed at Evaluation unless otherwise noted.  DIAGNOSTIC FINDINGS:  PATIENT SURVEYS:  FOTO 66  COGNITION: Overall cognitive status: Within functional limits for tasks assessed     SENSATION: WFL  POSTURE: rounded shoulders and forward head  PALPATION: Tender over inferior-medial knee / tibial plateau. Tender over mid thigh   LOWER EXTREMITY ROM:  Active ROM Right eval Left eval Right 02/26/2023  Hip flexion     Hip extension     Hip abduction     Hip adduction     Hip internal rotation     Hip external rotation     Knee flexion 89  120  Knee extension -2  0  Ankle dorsiflexion     Ankle plantarflexion     Ankle inversion     Ankle eversion      (Blank rows = not tested)  LOWER EXTREMITY MMT:  MMT Right eval Left eval  Hip flexion    Hip extension    Hip abduction    Hip adduction    Hip internal rotation    Hip external rotation    Knee flexion    Knee extension    Ankle dorsiflexion    Ankle plantarflexion    Ankle inversion    Ankle eversion     (Blank rows = not tested)   FUNCTIONAL TESTS:  5 times sit to stand: 24.2 sec 2 minute walk test: 228 feet  GAIT: Distance walked: 350 feet in clinic Assistive device utilized:  None Level of assistance: Complete Independence Comments: Walks with externally rotated and excessively pronated feet bilaterally. Decreased stride length and moderately increased lateral trunk sway.    TODAY'S TREATMENT:                                                                                                                              DATE:  02/26/23 Bike seat 16 x 5' dynamic warm up FOTO 68 2 MWT 343 ft no AD 5 x sit to stand 11.53 AROM right knee  0 to 120  02/21/23 Recumbent bike, 5', seat 16 Cybex HS curl, single leg, plate 5, 2 x 12, 3 sec eccentric  Leg Press, single leg, plate 4, 2 x 8, 3 sec eccentric  Single leg march to hip Ext in parallel bars, 3# AW, 1 HHA, 2 x 15 7" step ups 2 x15, right foot stays on step Standing knee flexion stretch 12" step 5 X10" ROM measured at EOS: 108 (standing flexion on step)   02/17/23 Supine: Moist heat to R knee 5' Patellar mobilization in all directions  Quad set x 10 SAQ with 4# AW x15  SAQ into SLR with 4# AW 2 x10 Heel slides AAROM with strap x10  7" step ups 2 x15, right foot stays on step Standing knee flexion stretch 12" step 10 X10"  Leg Press, single leg, plate 4, 2 x 8 Cybex HS curl, double leg, plate 6, 2 x 12 Recumbent bike, 5', seat 16   02/12/23 Supine: Moist heat to R knee 5' Patellar mobilization in all directions  Quad set x10 SAQ with 2# AW x15  SAQ into SLR with 2# AW x15 Heel slides AAROM with strap x10  Seated: EOB contract-relax knee flexion and distraction 5' Manual resistance during knee flexion and extension x15  Recumbent bike, 5', seat 16  4"  step ups x15, right foot stays on step 7" step ups x15, right foot stays on step Standing knee flexion stretch 12" step 10X10"   02/10/23 Bike seat 16 rocking 5 minutes Standing knee flexion stretch 12" step 10X10"  Rt knee flexion 15X  Hamstring stretch 10X10" Supine:  SLR 10X  Heelslides 10X before and after manual Manual to Rt knee  including MFR and retro massage AROM before manual 100 degrees, following manual 105 degrees  02/05/23 Goal review Bike seat  rocking 5 minutes Standing:  knee flexion stretch on 12" step 10X10"  Rt knee flexion 10X each Supine:  scar massage, retro following for edema AROM Rt knee 103 degrees  01/29/2023: Evaluation and HEP education  PATIENT EDUCATION:  Education details: Patient educated on POC, scope of PT, HEP, and swelling management strategies. Person educated: Patient Education method: Explanation, Demonstration, and Handouts Education comprehension: verbalized understanding, returned demonstration, verbal cues required, and tactile cues required   HOME EXERCISE PROGRAM: Access Code: [email or web address]  URL: [email or web address]  Date: 01/29/2023 Prepared by: [facility] - Rehab Exercises -  Supine Ankle Pumps  - 2 x daily - 7 x weekly - 1 sets - 15 reps  Supine Quadricep Sets  - 2 x daily - 7 x weekly - 1 sets - 10 reps  Supine Active Straight Leg Raise  - 2 x daily - 7 x weekly - 1 sets - 10 reps  Supine Short Arc Quad  - 2 x daily - 7 x weekly - 1 sets - 15 reps  Supine Heel Slides  - 2 x daily - 7 x weekly - 1 sets - 15 reps  Seated Long Arc Quad  - 2 x daily - 7 x weekly - 1 sets - 15 reps  Seated Knee Flexion Stretch  - 2 x daily - 7 x weekly - 1 sets - 15 reps   ASSESSMENT:  CLINICAL IMPRESSION: Started session with a warm up on the recumbent bike. Patient requests d/c after today's visit so reassessed him today.  Patient has met 5/7 set rehab goals and is agreeable to discharge at this time.   OBJECTIVE IMPAIRMENTS: decreased mobility, decreased ROM, and obesity.   ACTIVITY LIMITATIONS: lifting, sitting, squatting, and locomotion level  PARTICIPATION LIMITATIONS: driving  PERSONAL FACTORS: Fitness is also affecting patient's functional outcome.   REHAB POTENTIAL: Excellent  CLINICAL DECISION MAKING: Stable/uncomplicated  EVALUATION COMPLEXITY:  Low   GOALS: Goals reviewed with patient? No  SHORT TERM GOALS: Target date: 02/19/2023 Patient will be independent with initial HEP to improve self management strategies. Baseline: Goal status: met  2.  Patient will be able to perform a sit-to-stand and stand-to-sit transfer without the use of his hands in order to demonstrate sufficient functional strength of R LE. Baseline:  Goal status: met  3.  Patient will be able to flex knee to >115 degrees to increase functional ROM and mobility.  Baseline: see above Goal status: met   LONG TERM GOALS: Target date: 03/12/2023  By discharge patient will improve 2 MWT to >361ft in order to demonstrate improved functional ambulatory capacity in community setting.       Baseline: 228 feet; 343 02/26/23 Goal status: met  2.  By discharge, patient will increase FOTO score by >15 points in order to improve functional capacity and tolerance to daily activity.  Baseline: 68 02/26/23 Goal status: almost met; 1 point away  3.  Patient will report <2/10 pain while performing functional recreational activities for > 45 minutes  in order to improve endurance capacity. Baseline: 4-5/10 Goal status: in progress  4.  Patient will be able to drive a car in order to promote participation and independence. Baseline:  Goal status: met   5.  Patient will improve 5x STS time to <15 sec in order to demonstrate improved functional ambulatory capacity in community setting. Baseline: 02/26/23 11.53 sec no UE assist Goal status: met  PLAN:  PT FREQUENCY: 3x/week  PT DURATION: 6 weeks  PLANNED INTERVENTIONS: 97164- PT Re-evaluation, 97110-Therapeutic exercises, 97530- Therapeutic activity, 97112- Neuromuscular re-education, 97535- Self Care, 09811- Manual therapy, [phone]- Gait training, Balance training, Stair training, Joint mobilization, and Scar mobilization  PLAN FOR NEXT SESSION: discharge   9:48 AM, 02/26/23 Geet Hosking Small Cadan Maggart MPT [place]  [facility] [place] [phone] Ph:[phone]

## 2023-02-28 ENCOUNTER — Encounter (HOSPITAL_COMMUNITY): Payer: PPO

## 2023-03-03 ENCOUNTER — Encounter (HOSPITAL_COMMUNITY): Payer: PPO

## 2023-03-06 ENCOUNTER — Encounter (HOSPITAL_COMMUNITY): Payer: PPO

## 2023-03-10 ENCOUNTER — Encounter (HOSPITAL_COMMUNITY): Payer: PPO

## 2023-03-13 ENCOUNTER — Encounter (HOSPITAL_COMMUNITY): Payer: PPO

## 2023-03-17 ENCOUNTER — Encounter (HOSPITAL_COMMUNITY): Payer: PPO

## 2023-03-17 ENCOUNTER — Ambulatory Visit (INDEPENDENT_AMBULATORY_CARE_PROVIDER_SITE_OTHER): Payer: PPO | Admitting: Orthopedic Surgery

## 2023-03-17 VITALS — BP 121/86 | HR 92

## 2023-03-17 DIAGNOSIS — Z96652 Presence of left artificial knee joint: Secondary | ICD-10-CM

## 2023-03-17 DIAGNOSIS — Z96651 Presence of right artificial knee joint: Secondary | ICD-10-CM

## 2023-03-17 NOTE — Progress Notes (Signed)
   There were no vitals taken for this visit.  There is no height or weight on file to calculate BMI.  No chief complaint on file.   Encounter Diagnoses  Name Primary?   S/P TKR (total knee replacement), left 04/23/22 Yes   Status post total right knee replacement 01/15/23     DOI/DOS/ Date: 01/15/23  Improved- cold days its a little stiff a little numbness still but you had advised it would be like that for 12 -18 months

## 2023-03-17 NOTE — Progress Notes (Signed)
   BP 121/86   Pulse 92   There is no height or weight on file to calculate BMI.  Chief Complaint  Patient presents with   Routine Post Op    TKA DOS 01/15/23    Encounter Diagnoses  Name Primary?   S/P TKR (total knee replacement), left 04/23/22 Yes   Status post total right knee replacement 01/15/23    Improved  Ethan Oliver is doing well he lost a few pounds on Ozempic he has no complaints with his knee he is ambulatory without assistive device he has excellent range of motion good stability and no pain  Follow-up in a year for x-rays of both knees

## 2023-08-20 ENCOUNTER — Ambulatory Visit: Payer: Self-pay

## 2023-08-20 VITALS — BP 129/87 | HR 107 | Ht 70.0 in | Wt 302.0 lb

## 2023-08-20 DIAGNOSIS — G4711 Idiopathic hypersomnia with long sleep time: Secondary | ICD-10-CM

## 2023-08-20 DIAGNOSIS — Z7984 Long term (current) use of oral hypoglycemic drugs: Secondary | ICD-10-CM | POA: Diagnosis not present

## 2023-08-20 DIAGNOSIS — E119 Type 2 diabetes mellitus without complications: Secondary | ICD-10-CM | POA: Diagnosis not present

## 2023-08-20 MED ORDER — GLIPIZIDE ER 5 MG PO TB24: 5.0000 mg | ORAL_TABLET | Freq: Every evening | ORAL | 1 refills | Status: DC

## 2023-08-20 MED ORDER — TIRZEPATIDE 5 MG/0.5ML ~~LOC~~ SOAJ: 5.0000 mg | SUBCUTANEOUS | 1 refills | Status: DC

## 2023-08-20 NOTE — Progress Notes (Signed)
 New Patient Office Visit  Subjective    Patient ID: Ethan Oliver, male    DOB: [birth year]  Age: 68 y.o. MRN: [id number]  CC:  Chief Complaint  Patient presents with   Establish Care    Pt states here To establish care, don't sleep good, and blood sugar keeps dropping    HPI Ethan Oliver presents to establish care He reports episodes of hypoglycemia with glucose dropping to 50s at night, requiring peanut butter intake - Episode of sickness after eating at restaurant 2-3 weeks ago with multiple hypoglycemic episodes that night (glucose down to 64), associated with weakness - Decreased appetite, particularly for breakfast, now eating first meal around 11-11:30am - Reports decreased energy levels and being more irritable - Shortness of breath with stairs since knee surgery in early November - Sleep disturbance: getting up 2-3 times per night to urinate, sometimes up to 5 times   Past Medical History: - Recent knee surgery in November 2024 - Previous knee surgery in February 2024 with residual numbness - History of depression on citalopram  for approximately 20 years, but changed to Wellbutrin by previous PCP in 02/2003  Outpatient Encounter Medications as of 08/20/2023  Medication Sig   buPROPion (WELLBUTRIN XL) 150 MG 24 hr tablet Take 1 tablet by mouth daily.   celecoxib  (CELEBREX ) 100 MG capsule Take 100 mg by mouth in the morning.   Continuous Glucose Sensor (FREESTYLE LIBRE 2 SENSOR) MISC APPLY 1 SENSOR ON SKIN AND CHAGE EVERY 14 DAYS   hydrochlorothiazide  (HYDRODIURIL ) 12.5 MG tablet Take 12.5 mg by mouth daily.   lisinopril  (ZESTRIL ) 20 MG tablet Take 20 mg by mouth in the morning.   metFORMIN  (GLUCOPHAGE -XR) 750 MG 24 hr tablet Take 750 mg by mouth every morning.   pravastatin  (PRAVACHOL ) 40 MG tablet Take 40 mg by mouth in the morning.   tirzepatide (MOUNJARO) 5 MG/0.5ML Pen Inject 5 mg into the skin once a week.   [DISCONTINUED] citalopram  (CELEXA ) 20 MG tablet Take 20  mg by mouth in the morning.   [DISCONTINUED] glipiZIDE  (GLUCOTROL  XL) 10 MG 24 hr tablet Take 10 mg by mouth every evening.   glipiZIDE  (GLUCOTROL  XL) 5 MG 24 hr tablet Take 1 tablet (5 mg total) by mouth every evening.   [DISCONTINUED] apixaban  (ELIQUIS ) 2.5 MG TABS tablet Take 1 tablet (2.5 mg total) by mouth 2 (two) times daily.   [DISCONTINUED] docusate sodium  (COLACE) 100 MG capsule Take 1 capsule (100 mg total) by mouth 2 (two) times daily.   [DISCONTINUED] methocarbamol  (ROBAXIN ) 500 MG tablet Take 1 tablet (500 mg total) by mouth every 6 (six) hours as needed for muscle spasms.   [DISCONTINUED] Misc. Devices (COMMODE 3-IN-1) MISC Weight up to 350 lbs 3 in 1 commode   [DISCONTINUED] oxyCODONE -acetaminophen  (PERCOCET/ROXICET) 5-325 MG tablet Take 1 tablet by mouth every 4 (four) hours as needed for severe pain (pain score 7-10).   [DISCONTINUED] OZEMPIC, 0.25 OR 0.5 MG/DOSE, 2 MG/3ML SOPN Inject 0.5 mg into the skin every Sunday.   [DISCONTINUED] polyethylene glycol (MIRALAX  / GLYCOLAX ) 17 g packet Take 17 g by mouth daily as needed for mild constipation.   No facility-administered encounter medications on file as of 08/20/2023.    Past Medical History:  Diagnosis Date   Arthritis    Chronic back pain    Depression    Diabetes mellitus without complication (HCC)    History of gout    History of kidney stones    Hypercholesteremia  Hypertension    PONV (postoperative nausea and vomiting)     Past Surgical History:  Procedure Laterality Date   ACHILLES TENDON SURGERY Left 12/29/2013   Procedure: PERCUTANEOUS TENDO-ACHILLES LENGTHENING;  Surgeon: Dewayne Ford, DPM;  Location: [facility];  Service: Podiatry;  Laterality: Left;   CALCANEAL OSTEOTOMY WITH ILIAC CREST BONE GRAFT AND REPAIR SUBLEXING TENDON AND ACHILLES TENDON Left 12/29/2013   Procedure: CALCANEOCUBOID JOINT FUSION PERFORMED WITH BONE GRAFT;  Surgeon: Dewayne Ford, DPM;  Location: [facility];  Service:  Podiatry;  Laterality: Left;   CARPAL TUNNEL RELEASE Bilateral    COLONOSCOPY WITH PROPOFOL  N/A 05/02/2021   Procedure: COLONOSCOPY WITH PROPOFOL ;  Surgeon: Ruby Corporal, MD;  Location[facility];  Service: Endoscopy;  Laterality: N/A;  930   Disk fusion     lumbar   FLEXOR TENOTOMY  Right 02/18/2018   Procedure: PERCUTANEOUS FLEXOR TENOTOMY/APPLICATION OF BK POSTERIOR SPLINT RLE;  Surgeon: Iverson Market, DPM;  Location: [facility];  Service: Podiatry;  Laterality: Right;   FOOT ARTHRODESIS Left 12/29/2013   Procedure: TRIPLE ARTHRODESIS FOOT;  Surgeon: Dewayne Ford, DPM;  Location: [facility];  Service: Podiatry;  Laterality: Left;   FOOT ARTHRODESIS Right 02/18/2018   Procedure: TRIPLE ARTHRODESIS RIGHT FOOT;  Surgeon: Iverson Market, DPM;  Location: [facility];  Service: Podiatry;  Laterality: Right;   KNEE SURGERY Right    Surgery on this knee twice.    POLYPECTOMY  05/02/2021   Procedure: POLYPECTOMY INTESTINAL;  Surgeon: Ruby Corporal, MD;  [facility];  Service: Endoscopy;;   TOTAL KNEE ARTHROPLASTY Left 04/23/2022   Procedure: TOTAL KNEE ARTHROPLASTY;  Surgeon: Darrin Emerald, MD;  Location: [facility];  Service: Orthopedics;  Laterality: Left;   TOTAL KNEE ARTHROPLASTY Right 01/15/2023   Procedure: TOTAL KNEE ARTHROPLASTY;  Surgeon: Darrin Emerald, MD;  Location: [facility];  Service: Orthopedics;  Laterality: Right;    Family History  Problem Relation Age of Onset   Healthy Mother    Healthy Father     Social History   Socioeconomic History   Marital status: Married    Spouse name: Not on file   Number of children: Not on file   Years of education: Not on file   Highest education level: GED or equivalent  Occupational History   Not on file  Tobacco Use   Smoking status: Never   Smokeless tobacco: Former    Types: Chew    Quit date: 09/21/2013  Vaping Use   Vaping status: Never Used  Substance and Sexual Activity   Alcohol use: No    Drug use: No   Sexual activity: Yes    Birth control/protection: None  Other Topics Concern   Not on file  Social History Narrative   Not on file   Social Drivers of Health   Financial Resource Strain: Low Risk  (08/19/2023)   Overall Financial Resource Strain (CARDIA)    Difficulty of Paying Living Expenses: Not very hard  Food Insecurity: No Food Insecurity (08/19/2023)   Hunger Vital Sign    Worried About Running Out of Food in the Last Year: Never true    Ran Out of Food in the Last Year: Never true  Transportation Needs: No Transportation Needs (08/19/2023)   PRAPARE - Administrator, Civil Service (Medical): No    Lack of Transportation (Non-Medical): No  Physical Activity: Insufficiently Active (08/19/2023)   Exercise Vital Sign    Days of Exercise per Week:  2 days    Minutes of Exercise per Session: 20 min  Stress: No Stress Concern Present (08/19/2023)   [employer] of Occupational Health - Occupational Stress Questionnaire    Feeling of Stress : Not at all  Social Connections: Moderately Isolated (08/19/2023)   Social Connection and Isolation Panel    Frequency of Communication with Friends and Family: Three times a week    Frequency of Social Gatherings with Friends and Family: Not on file    Attends Religious Services: Never    Active Member of Clubs or Organizations: No    Attends Engineer, structural: Not on file    Marital Status: Married  Catering manager Violence: Not At Risk (01/15/2023)   Humiliation, Afraid, Rape, and Kick questionnaire    Fear of Current or Ex-Partner: No    Emotionally Abused: No    Physically Abused: No    Sexually Abused: No    ROS      Objective    BP 129/87   Pulse (!) 107   Ht 5' 10 (1.778 m)   Wt (!) 302 lb 0.6 oz (137 kg)   SpO2 93%   BMI 43.34 kg/m   Physical Exam Vitals and nursing note reviewed. Exam conducted with a chaperone present (wife is with him today).  Constitutional:       Appearance: He is obese.  HENT:     Head: Normocephalic.     Right Ear: Tympanic membrane, ear canal and external ear normal.     Left Ear: Tympanic membrane, ear canal and external ear normal.     Nose: Nose normal.     Mouth/Throat:     Mouth: Mucous membranes are moist.     Pharynx: Oropharynx is clear.   Cardiovascular:     Rate and Rhythm: Normal rate and regular rhythm.  Pulmonary:     Effort: Pulmonary effort is normal.     Breath sounds: Normal breath sounds.   Musculoskeletal:        General: Normal range of motion.     Cervical back: Normal range of motion and neck supple.   Skin:    General: Skin is warm and dry.   Neurological:     Mental Status: He is alert and oriented to person, place, and time.   Psychiatric:        Mood and Affect: Mood normal.        Thought Content: Thought content normal.         Assessment & Plan:   Problem List Items Addressed This Visit       Endocrine   Type 2 diabetes mellitus without complication, without long-term current use of insulin  (HCC) - Primary   He denies any weight loss while taking Ozempic, and has been on maximum dose for past few months.  We will switch him over to Mounjaro and reduce dose of glipizide  based on ongoing episodes of hypoglycemia.  Continue to work on [employer] and regular exercise to improve blood sugar levels. Recheck BMP and A1c today.  Follow-up in 3 months or sooner if labs indicate.      Relevant Medications   tirzepatide (MOUNJARO) 5 MG/0.5ML Pen   glipiZIDE  (GLUCOTROL  XL) 5 MG 24 hr tablet   Other Relevant Orders   BMP8+eGFR (Completed)   HgB A1c (Completed)   Other Visit Diagnoses       Idiopathic hypersomnia with long sleep time       His previous PCP had  ordered a sleep study but he did not have this done at that time.  We will reorder sleep study at this time.   Relevant Orders   Ambulatory referral to Sleep Studies       No follow-ups on file.   Alison Irvine, FNP

## 2023-08-21 ENCOUNTER — Ambulatory Visit: Payer: Self-pay

## 2023-08-21 ENCOUNTER — Other Ambulatory Visit (HOSPITAL_COMMUNITY): Payer: Self-pay

## 2023-08-21 ENCOUNTER — Telehealth: Payer: Self-pay | Admitting: Pharmacy Technician

## 2023-08-21 LAB — HEMOGLOBIN A1C
Est. average glucose Bld gHb Est-mCnc: 146 mg/dL
Hgb A1c MFr Bld: 6.7 % — ABNORMAL HIGH (ref 4.8–5.6)

## 2023-08-21 LAB — BMP8+EGFR
BUN/Creatinine Ratio: 20 (ref 10–24)
BUN: 18 mg/dL (ref 8–27)
CO2: 22 mmol/L (ref 20–29)
Calcium: 10.3 mg/dL — ABNORMAL HIGH (ref 8.6–10.2)
Chloride: 101 mmol/L (ref 96–106)
Creatinine, Ser: 0.89 mg/dL (ref 0.76–1.27)
Glucose: 89 mg/dL (ref 70–99)
Potassium: 4.3 mmol/L (ref 3.5–5.2)
Sodium: 140 mmol/L (ref 134–144)
eGFR: 95 mL/min/{1.73_m2} (ref >59)

## 2023-08-21 NOTE — Telephone Encounter (Signed)
 Pharmacy Patient Advocate Encounter  Received notification from [facility] [employer]VANTAGE/RX [employer]VANCE that Prior Authorization for Mounjaro 5MG /0.5ML auto-injectors has been APPROVED from 08/21/2023 to 08/20/2024. Ran test claim, Copay is $47.00. This test claim was processed through [employer] copay amounts may vary at other pharmacies due to pharmacy/plan contracts, or as the patient moves through the different stages of their insurance plan.   PA #/Case ID/Reference #: Key: [id number]

## 2023-08-21 NOTE — Assessment & Plan Note (Signed)
 He denies any weight loss while taking Ozempic, and has been on maximum dose for past few months.  We will switch him over to Mounjaro and reduce dose of glipizide  based on ongoing episodes of hypoglycemia.  Continue to work on [employer] and regular exercise to improve blood sugar levels. Recheck BMP and A1c today.  Follow-up in 3 months or sooner if labs indicate.

## 2023-08-21 NOTE — Telephone Encounter (Signed)
 Pharmacy Patient Advocate Encounter   Received notification from CoverMyMeds that prior authorization for Mounjaro 5MG /0.5ML auto-injectors is required/requested.   Insurance verification completed.   The patient is insured through [facility] ADVANTAGE/RX ADVANCE .   Per test claim: PA required; PA submitted to above mentioned insurance via CoverMyMeds Key/confirmation #/EOC [id number] Status is pending

## 2023-09-25 ENCOUNTER — Ambulatory Visit: Payer: Self-pay

## 2023-09-26 ENCOUNTER — Ambulatory Visit: Payer: Self-pay | Admitting: *Deleted

## 2023-09-26 ENCOUNTER — Other Ambulatory Visit: Payer: Self-pay

## 2023-09-26 DIAGNOSIS — U099 Post covid-19 condition, unspecified: Secondary | ICD-10-CM

## 2023-09-26 MED ORDER — ALBUTEROL SULFATE HFA 108 (90 BASE) MCG/ACT IN AERS: 2.0000 | INHALATION_SPRAY | Freq: Four times a day (QID) | RESPIRATORY_TRACT | 0 refills | Status: DC | PRN

## 2023-09-26 NOTE — Telephone Encounter (Signed)
 Copied from CRM [id number]. Topic: Clinical - Red Word Triage >> Sep 26, 2023  9:49 AM Ethan Oliver wrote: Red Word that prompted transfer to Nurse Triage: Patient is calling to report breathing difficulties for 2-3 months. Reporting long haul COVID. Patient does not have inhaler. Reason for Disposition  [1] MODERATE longstanding difficulty breathing (e.g., speaks in phrases, SOB even at rest, pulse 100-120) AND [2] SAME as normal  Answer Assessment - Initial Assessment Questions 1. RESPIRATORY STATUS: Describe your breathing? (e.g., wheezing, shortness of breath, unable to speak, severe coughing)      I had Covid twice.  Last time was 3 yrs ago.   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   Recently I'm having trouble breathing.   I can walk up the steps and I'm out of breath.  Not with sitting.   I can walk through the house and I'm short of breath.   No chest tightness, wheezing or pain. 2. ONSET: When did this breathing problem begin?      2-3 months ago 3. PATTERN Does the difficult breathing come and go, or has it been constant since it started?      With any exertion 4. SEVERITY: How bad is your breathing? (e.g., mild, moderate, severe)      I get shortness of breath with exertion due to long Covid 5. RECURRENT SYMPTOM: Have you had difficulty breathing before? If Yes, ask: When was the last time? and What happened that time?      I had Covid. 6. CARDIAC HISTORY: Do you have any history of heart disease? (e.g., heart attack, angina, bypass surgery, angioplasty)      No 7. LUNG HISTORY: Do you have any history of lung disease?  (e.g., pulmonary embolus, asthma, emphysema)     Long Covid  8. CAUSE: What do you think is causing the breathing problem?      Long covid 9. OTHER SYMPTOMS: Do you have any other symptoms? (e.g., chest pain, cough, dizziness, fever, runny nose)     No coughing 10. O2 SATURATION MONITOR:  Do you use an oxygen saturation monitor (pulse oximeter) at home? If Yes, ask:  What is your reading (oxygen level) today? What is your usual oxygen saturation reading? (e.g., 95%)       N/A 11. PREGNANCY: Is there any chance you are pregnant? When was your last menstrual period?       N/A 12. TRAVEL: Have you traveled out of the country in the last month? (e.g., travel history, exposures)       N/A  Protocols used: Breathing Difficulty-A-AH FYI Only or Action Required?: FYI only for provider.  Patient was last seen in primary care on 08/20/2023 by Bevely Doffing, FNP.  Called Nurse Triage reporting Shortness of Breath.  Symptoms began several months ago. Has long covid and shortness of breath is becoming worse over last 2-3 months.  Interventions attempted: Nothing.  Symptoms are: gradually worsening.  Triage Disposition: See PCP When Office is Open (Within 3 Days)  Patient/caregiver understands and will follow disposition?: Yes

## 2023-10-01 ENCOUNTER — Ambulatory Visit: Payer: Self-pay

## 2023-10-01 VITALS — BP 123/82 | HR 83 | Ht 70.0 in | Wt 305.0 lb

## 2023-10-01 DIAGNOSIS — R0602 Shortness of breath: Secondary | ICD-10-CM | POA: Diagnosis not present

## 2023-10-01 DIAGNOSIS — E119 Type 2 diabetes mellitus without complications: Secondary | ICD-10-CM | POA: Diagnosis not present

## 2023-10-01 DIAGNOSIS — G4733 Obstructive sleep apnea (adult) (pediatric): Secondary | ICD-10-CM

## 2023-10-01 MED ORDER — FREESTYLE LIBRE 3 SENSOR MISC: 3 refills | Status: AC

## 2023-10-01 MED ORDER — TIRZEPATIDE 7.5 MG/0.5ML ~~LOC~~ SOAJ: 7.5000 mg | SUBCUTANEOUS | 1 refills | Status: DC

## 2023-10-01 NOTE — Progress Notes (Signed)
 Established Patient Office Visit  Subjective   Patient ID: Ethan Oliver, male    DOB: 02/17/1956  Age: 68 y.o. MRN: [id number]  Chief Complaint  Patient presents with   Medical Management of Chronic Issues    SOB x 2 months     HPI  Patient Active Problem List   Diagnosis Date Noted   SOB (shortness of breath) on exertion 10/10/2023   Obstructive sleep apnea hypopnea, moderate 10/01/2023   Status post total right knee replacement 01/15/23 01/31/2023   Primary osteoarthritis of right knee 01/15/2023   Osteoarthritis of right knee 01/15/2023   S/P TKR (total knee replacement), left 04/23/22 05/07/2022   Primary osteoarthritis of left knee 04/23/2022   Mood disorder (HCC) 09/12/2021   Postlaminectomy syndrome of lumbar region 09/12/2021   Arthritis of left elbow 06/21/2019   Cubital tunnel syndrome on left 06/21/2019   Inflammation of left elbow 06/21/2019   Arthritis, lumbar spine 08/05/2018   Benign essential hypertension 11/07/2016   Generalized osteoarthritis of multiple sites 11/07/2016   Type 2 diabetes mellitus without complication, without long-term current use of insulin  (HCC) 11/07/2016   Diastasis recti 11/07/2016   Pure hypercholesterolemia 11/07/2016   Morbid obesity with BMI of 40.0-44.9, adult (HCC) 12/27/2013   Back pain 12/27/2013   Degeneration of lumbar intervertebral disc 12/27/2013   Lumbar radiculopathy 12/27/2013   Lumbosacral spondylosis without myelopathy 12/28/2012      ROS    Objective:     BP 123/82   Pulse 83   Ht 5' 10 (1.778 m)   Wt (!) 305 lb 0.6 oz (138.4 kg)   SpO2 92%   BMI 43.77 kg/m  BP Readings from Last 3 Encounters:  10/01/23 123/82  08/20/23 129/87  03/17/23 121/86   Wt Readings from Last 3 Encounters:  10/01/23 (!) 305 lb 0.6 oz (138.4 kg)  08/20/23 (!) 302 lb 0.6 oz (137 kg)  01/08/23 (!) 304 lb 0.2 oz (137.9 kg)      Physical Exam Vitals and nursing note reviewed.  Constitutional:      Appearance: Normal  appearance. He is obese.  HENT:     Head: Normocephalic.  Eyes:     Extraocular Movements: Extraocular movements intact.     Pupils: Pupils are equal, round, and reactive to light.  Cardiovascular:     Rate and Rhythm: Normal rate and regular rhythm.     Heart sounds: No murmur heard. Pulmonary:     Effort: Pulmonary effort is normal.     Breath sounds: Normal breath sounds.  Musculoskeletal:     Cervical back: Normal range of motion and neck supple.  Neurological:     Mental Status: He is alert and oriented to person, place, and time.  Psychiatric:        Mood and Affect: Mood normal.        Thought Content: Thought content normal.      No results found for any visits on 10/01/23.  Last CBC Lab Results  Component Value Date   WBC 8.4 01/16/2023   HGB 13.6 01/16/2023   HCT 42.1 01/16/2023   MCV 92.1 01/16/2023   MCH 29.8 01/16/2023   RDW 13.2 01/16/2023   PLT 170 01/16/2023   Last metabolic panel Lab Results  Component Value Date   GLUCOSE 89 08/20/2023   NA 140 08/20/2023   K 4.3 08/20/2023   CL 101 08/20/2023   CO2 22 08/20/2023   BUN 18 08/20/2023   CREATININE 0.89 08/20/2023  EGFR 95 08/20/2023   CALCIUM 10.3 (H) 08/20/2023   ANIONGAP 9 01/16/2023   Last lipids No results found for: CHOL, HDL, LDLCALC, LDLDIRECT, TRIG, CHOLHDL Last hemoglobin A1c Lab Results  Component Value Date   HGBA1C 6.7 (H) 08/20/2023   Last thyroid functions No results found for: TSH, T3TOTAL, T4TOTAL, THYROIDAB    The 10-year ASCVD risk score (Arnett DK, et al., 2019) is: 24.4%    Assessment & Plan:   Problem List Items Addressed This Visit       Respiratory   Obstructive sleep apnea hypopnea, moderate   Recent home sleep study showed obstructive sleep apnea.  Will refer to pulmonary for treatment of this.  Results were reviewed with patient and his wife.  Recommend weight loss as well to help reduce symptoms of sleep apnea.      Relevant  Orders   Ambulatory referral to Pulmonology     Endocrine   Type 2 diabetes mellitus without complication, without long-term current use of insulin  (HCC) - Primary   He denies any weight loss while taking Ozempic, and has been on maximum dose for past few months.  We will switch him over to Mounjaro  and reduce dose of glipizide  based on ongoing episodes of hypoglycemia.  Continue to work on [employer] and regular exercise to improve blood sugar levels. Follow-up in 3 months or sooner if labs indicate. Lab Results  Component Value Date   HGBA1C 6.7 (H) 08/20/2023   HGBA1C 7.6 (H) 01/15/2023   HGBA1C 7.2 (H) 04/19/2022         Relevant Medications   Continuous Glucose Sensor (FREESTYLE LIBRE 3 SENSOR) MISC   tirzepatide  (MOUNJARO ) 7.5 MG/0.5ML Pen     Other   SOB (shortness of breath) on exertion   May be multifactorial in nature, due to obesity and recently diagnosed OSA, but will refer to cardiology given abnormal EKG a year ago as part of preop evaluation for total knee replacement.  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      Relevant Orders   Ambulatory referral to Cardiology    Return in about 3 months (around 01/01/2024) for chronic follow-up with PCP.    Leita Longs, FNP

## 2023-10-02 ENCOUNTER — Telehealth: Payer: Self-pay | Admitting: *Deleted

## 2023-10-02 NOTE — Telephone Encounter (Signed)
 Copied from CRM [id number]. Topic: Appointments - Scheduling Inquiry for Clinic >> Oct 02, 2023 10:39 AM Ethan Oliver wrote: Reason for CRM: Patient requesting to be scheduled from referral for the [facility] location. Requested to be called and scheduled, declined what PAS offered.  Requesting C/B at home number.  Spoke with patient regarding the referral for Sleep Apnea---scheduled for Tuesday 11/11/23 at 1:00pm here in the [place] office .  Will mail information to patient and he is to being a copy of his HST

## 2023-10-06 ENCOUNTER — Other Ambulatory Visit: Payer: Self-pay

## 2023-10-06 DIAGNOSIS — E119 Type 2 diabetes mellitus without complications: Secondary | ICD-10-CM

## 2023-10-06 MED ORDER — FREESTYLE LIBRE 3 READER DEVI: 0 refills | Status: AC

## 2023-10-09 ENCOUNTER — Institutional Professional Consult (permissible substitution): Admitting: Neurology

## 2023-10-10 DIAGNOSIS — R0602 Shortness of breath: Secondary | ICD-10-CM | POA: Insufficient documentation

## 2023-10-10 NOTE — Assessment & Plan Note (Signed)
 He denies any weight loss while taking Ozempic, and has been on maximum dose for past few months.  We will switch him over to Mounjaro  and reduce dose of glipizide  based on ongoing episodes of hypoglycemia.  Continue to work on [employer] and regular exercise to improve blood sugar levels. Follow-up in 3 months or sooner if labs indicate. Lab Results  Component Value Date   HGBA1C 6.7 (H) 08/20/2023   HGBA1C 7.6 (H) 01/15/2023   HGBA1C 7.2 (H) 04/19/2022

## 2023-10-10 NOTE — Assessment & Plan Note (Signed)
 Recent home sleep study showed obstructive sleep apnea.  Will refer to pulmonary for treatment of this.  Results were reviewed with patient and his wife.  Recommend weight loss as well to help reduce symptoms of sleep apnea.

## 2023-10-10 NOTE — Assessment & Plan Note (Signed)
 May be multifactorial in nature, due to obesity and recently diagnosed OSA, but will refer to cardiology given abnormal EKG a year ago as part of preop evaluation for total knee replacement.  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

## 2023-10-16 ENCOUNTER — Other Ambulatory Visit: Payer: Self-pay

## 2023-10-16 DIAGNOSIS — R0602 Shortness of breath: Secondary | ICD-10-CM

## 2023-11-07 ENCOUNTER — Telehealth: Payer: Self-pay

## 2023-11-07 NOTE — Telephone Encounter (Signed)
 ATC x1 for precharting lmtcb

## 2023-11-07 NOTE — Telephone Encounter (Signed)
 Copied from CRM #[id number]. Topic: Appointments - Appointment Info/Confirmation >> Nov 07, 2023 12:50 PM Devaughn RAMAN wrote: Patient is returning [place] phone call regarding an appointment he has for Tuesday, Patient stated Lucie stated she had some questions prior to his appointment on Tuesday, contacted [facility] unable to speak with anyone, advised patient he would receive a f/u call back. Patient was thankful and verbalized understanding. Patient expressed no other needs at this time.  Spoke with pt for pre chart

## 2023-11-11 ENCOUNTER — Ambulatory Visit (HOSPITAL_COMMUNITY)
Admission: RE | Admit: 2023-11-11 | Discharge: 2023-11-11 | Disposition: A | Discharge status: Home / Self Care | Source: Ambulatory Visit | Attending: Pulmonary Disease | Admitting: Pulmonary Disease

## 2023-11-11 ENCOUNTER — Encounter: Payer: Self-pay | Admitting: Pulmonary Disease

## 2023-11-11 ENCOUNTER — Ambulatory Visit: Admitting: Pulmonary Disease

## 2023-11-11 VITALS — BP 135/88 | HR 93 | Ht 70.0 in | Wt 303.0 lb

## 2023-11-11 DIAGNOSIS — G4733 Obstructive sleep apnea (adult) (pediatric): Secondary | ICD-10-CM | POA: Diagnosis not present

## 2023-11-11 DIAGNOSIS — R0609 Other forms of dyspnea: Secondary | ICD-10-CM | POA: Insufficient documentation

## 2023-11-11 DIAGNOSIS — Z87891 Personal history of nicotine dependence: Secondary | ICD-10-CM

## 2023-11-11 DIAGNOSIS — Z6841 Body Mass Index (BMI) 40.0 and over, adult: Secondary | ICD-10-CM | POA: Diagnosis not present

## 2023-11-11 NOTE — Assessment & Plan Note (Signed)
 Discussed with the patient about the importance of maintaining a healthy weight and the positive impact it can have on lung function. Encouraged them to reduce caloric intake and increase activity.

## 2023-11-11 NOTE — Patient Instructions (Addendum)
-   Please get CXR and blood tests done today - You will receive phone call to set up CPAP machine - Schedule echo - Will inform you of results. - Be consistent. Go to bed at the same time each night and get up at the same time each morning, including on the weekends - Make sure your bedroom is quiet, dark, relaxing, and at a comfortable temperature - Remove electronic devices, such as TVs, computers, and smart phones, from the bedroom - Avoid large meals, caffeine, and alcohol before bedtime - Get some exercise. Being physically active during the day can help you fall asleep more easily at night.

## 2023-11-11 NOTE — Progress Notes (Signed)
 New Patient Pulmonology Office Visit   Subjective:  Patient ID: Ethan Oliver, male    DOB: 12-27-1955  MRN: [id number]  Referred by: Bevely Doffing, FNP  CC:  Chief Complaint  Patient presents with   Establish Care    Shob - sleepiness, stops breathing at night.    HPI Ethan Oliver is a 68 y.o. male with PMH significant for HTN and DM who is here for initial evaluation of obstructive sleep apnea.  He does not sleep well at night. He snores and sometimes stop breathing. Home sleep test - mild sleep apnea.   Wakes up 5-6 times a night. Sleep latency 45 - 60 minutes. Pee around 4 to 5 times a night. Per his wife, wittnessed chocking, gurgling and stop breathing. He is tired during the day. Morning headache some morning. Tired throughout the day. Feels like he needs a nap. Hardly takes a nap. Sometimes has to sleep 5:30 to 7:30 PM. Lately, he has been feeling shortness of breath with going up and down steps or taking a shower. Sometimes with carrying stuff and walking on level ground. He had COVID 19 x 2. Virus takes forever to get rid of. Not a suddent onset. 2-3 months. Knee surgery in 01/2023. Orthopnea +. PND +. Leg swelling -. No chest pain. Has wheezing intermittently. Coughing in the AM. No asthma triggers. Albuterol  helps. Started manjaro around 3 months ago and he did not lose weight.  Sleep history The patient goes to bed at 9:30 PM and falls asleep within 45 minutes. They have 4 awakenings at night due to toileting. They do not fall asleep easily after returning to bed. The patient does not report snoring. The patient does report witnessed apneas. The patient does report morning headaches or dry mouth. The patient does complain of excessive daytime sleepiness. The patient does not take naps. The patient does not drink coffee.  The Epworth Sleepiness score is 5/24.   STOP BANG: [X]  Snoring - do you snore loudly? [X]  Tired - do you feel tired, fatigued, or sleepy during daytime? [X]   Observed apneas? [X]  Pressure - hx of high blood pressure? [X]  BMI? [X]  Age > 50? [X]  Neck circumference: > 16 inch or 40 cm collar? [X]  Male Gender  Total - 8/8 (>2 moderate risk, >4 high risk)  PMH:  - HTN - HLD - DM - Obesity - Melanoma s/p resection  PSH:  - Knee surgery x 2, last one is 01/2023  Medications: reviewed in chart  Allergies: none  Social Hx: - Smoking: none  - Second hand smoking: none  - Vaping: none  - Alcohol: occassional  - Illicit substances: none  - Indoor emission from household combustion: none  - Hobbies (e.g. wood work, Surveyor, quantity, bird breeding etc...): wood working Barrister's clerk (e.g. 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 [ ]  Asbestos - Holiday representative workers, Medical sales representative, Therapist, sports, railroad, English as a second language teacher  [ ]  Ionizing radiation - uranium mining  [ ]  Vinyl chloride - pulp & paper workers  [ ]  Arsenic - smelting of ores, Chief Executive Officer, wood preservation  [ ]  Beryllium - Archivist workers, Geophysical data processor, Pensions consultant workers, jewelers  [ ]  chromium - Financial trader, welding, tanning industries  [ ]  Nickel - Chief Strategy Officer, Naval architect, Physiological scientist, glass workers, Health and safety inspector, metal workers   Family Hx:  - Lung disease: none  -  Cancer: none      11/11/2023    1:00 PM  Results of the Epworth flowsheet  Sitting and reading 1  Watching TV 1  Sitting, inactive in a public place (e.g. a theatre or a meeting) 0  As a passenger in a car for an hour without a break 0  Lying down to rest in the afternoon when circumstances permit 2  Sitting and talking to someone 0  Sitting quietly after a lunch without alcohol 1  In a car, while stopped for a few minutes in traffic 0  Total score 5    ROS  Allergies: Codeine  Current Outpatient Medications:    albuterol  (VENTOLIN  HFA) 108 (90 Base)  MCG/ACT inhaler, INHALE 2 PUFFS BY MOUTH EVERY 6 HOURS AS NEEDED FOR WHEEZING OR SHORTNESS OF BREATH, Disp: 18 g, Rfl: 0   buPROPion (WELLBUTRIN XL) 150 MG 24 hr tablet, Take 1 tablet by mouth daily., Disp: , Rfl:    celecoxib  (CELEBREX ) 100 MG capsule, Take 100 mg by mouth in the morning., Disp: , Rfl:    Continuous Glucose Receiver (FREESTYLE LIBRE 3 READER) DEVI, Use it to check blood glucose as instructed., Disp: 1 each, Rfl: 0   Continuous Glucose Sensor (FREESTYLE LIBRE 3 SENSOR) MISC, Use it to check blood glucose.  Apply new sensor every 14 days., Disp: 6 each, Rfl: 3   glipiZIDE  (GLUCOTROL  XL) 5 MG 24 hr tablet, Take 1 tablet (5 mg total) by mouth every evening., Disp: 90 tablet, Rfl: 1   hydrochlorothiazide  (HYDRODIURIL ) 12.5 MG tablet, Take 12.5 mg by mouth daily., Disp: , Rfl: 3   lisinopril  (ZESTRIL ) 20 MG tablet, Take 20 mg by mouth in the morning., Disp: , Rfl:    metFORMIN  (GLUCOPHAGE -XR) 750 MG 24 hr tablet, Take 750 mg by mouth every morning., Disp: , Rfl:    pravastatin  (PRAVACHOL ) 40 MG tablet, Take 40 mg by mouth in the morning., Disp: , Rfl:    tirzepatide  (MOUNJARO ) 7.5 MG/0.5ML Pen, Inject 7.5 mg into the skin once a week., Disp: 6 mL, Rfl: 1 Past Medical History:  Diagnosis Date   Allergy    Arthritis    Chronic back pain    Depression    Diabetes mellitus without complication (HCC)    History of gout    History of kidney stones    Hypercholesteremia    Hypertension    PONV (postoperative nausea and vomiting)    Past Surgical History:  Procedure Laterality Date   ACHILLES TENDON SURGERY Left 12/29/2013   Procedure: PERCUTANEOUS TENDO-ACHILLES LENGTHENING;  Surgeon: Morene Donley Anon, DPM;  Location: [facility];  Service: Podiatry;  Laterality: Left;   CALCANEAL OSTEOTOMY WITH ILIAC CREST BONE GRAFT AND REPAIR SUBLEXING TENDON AND ACHILLES TENDON Left 12/29/2013   Procedure: CALCANEOCUBOID JOINT FUSION PERFORMED WITH BONE GRAFT;  Surgeon: Morene Donley Anon,  DPM;  Location: [facility];  Service: Podiatry;  Laterality: Left;   CARPAL TUNNEL RELEASE Bilateral    COLONOSCOPY WITH PROPOFOL  N/A 05/02/2021   Procedure: COLONOSCOPY WITH PROPOFOL ;  Surgeon: Golda Claudis PENNER, MD;  Location: [facility];  Service: Endoscopy;  Laterality: N/A;  930   Disk fusion     lumbar   FLEXOR TENOTOMY  Right 02/18/2018   Procedure: PERCUTANEOUS FLEXOR TENOTOMY/APPLICATION OF BK POSTERIOR SPLINT RLE;  Surgeon: Anon Morene, DPM;  Location: [facility];  Service: Podiatry;  Laterality: Right;   FOOT ARTHRODESIS Left 12/29/2013   Procedure: TRIPLE ARTHRODESIS FOOT;  Surgeon: Morene Donley Anon, DPM;  Location: [facility];  Service: Podiatry;  Laterality: Left;   FOOT ARTHRODESIS Right 02/18/2018   Procedure: TRIPLE ARTHRODESIS RIGHT FOOT;  Surgeon: Blinda Katz, DPM;  Location: [facility];  Service: Podiatry;  Laterality: Right;   JOINT REPLACEMENT     KNEE SURGERY Right    Surgery on this knee twice.    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   Procedure: TOTAL KNEE ARTHROPLASTY;  Surgeon: Margrette Taft BRAVO, MD;  Location: [facility];  Service: Orthopedics;  Laterality: Left;   TOTAL KNEE ARTHROPLASTY Right 01/15/2023   Procedure: TOTAL KNEE ARTHROPLASTY;  Surgeon: Margrette Taft BRAVO, MD;  Location: [facility];  Service: Orthopedics;  Laterality: Right;   Family History  Problem Relation Age of Onset   Heart disease Mother    Hyperlipidemia Mother    Hypertension Mother    Arthritis Father    Diabetes Father    Heart disease Father    Hyperlipidemia Father    Hypertension Father    Heart disease Sister    Hyperlipidemia Sister    Hypertension Sister    Social History   Socioeconomic History   Marital status: Married    Spouse name: Not on file   Number of children: Not on file   Years of  education: Not on file   Highest education level: GED or equivalent  Occupational History   Not on file  Tobacco Use   Smoking status: Never   Smokeless tobacco: Former    Types: Chew    Quit date: 09/21/2013  Vaping Use   Vaping status: Never Used  Substance and Sexual Activity   Alcohol use: No   Drug use: No   Sexual activity: Yes    Birth control/protection: None  Other Topics Concern   Not on file  Social History Narrative   Not on file   Social Drivers of Health   Financial Resource Strain: Low Risk  (08/19/2023)   Overall Financial Resource Strain (CARDIA)    Difficulty of Paying Living Expenses: Not very hard  Food Insecurity: No Food Insecurity (08/19/2023)   Hunger Vital Sign    Worried About Running Out of Food in the Last Year: Never true    Ran Out of Food in the Last Year: Never true  Transportation Needs: No Transportation Needs (08/19/2023)   PRAPARE - Administrator, Civil Service (Medical): No    Lack of Transportation (Non-Medical): No  Physical Activity: Insufficiently Active (08/19/2023)   Exercise Vital Sign    Days of Exercise per Week: 2 days    Minutes of Exercise per Session: 20 min  Stress: No Stress Concern Present (08/19/2023)   [employer] of Occupational Health - Occupational Stress Questionnaire    Feeling of Stress : Not at all  Social Connections: Moderately Isolated (08/19/2023)   Social Connection and Isolation Panel    Frequency of Communication with Friends and Family: Three times a week    Frequency of Social Gatherings with Friends and Family: Not on file    Attends Religious Services: Never    Active Member of Clubs or Organizations: No    Attends Banker Meetings: Not on file    Marital Status: Married  Intimate Partner Violence: Not At Risk (01/15/2023)   Humiliation, Afraid, Rape, and Kick questionnaire    Fear of  Current or Ex-Partner: No    Emotionally Abused: No    Physically Abused: No     Sexually Abused: No       Objective:  BP 135/88   Pulse 93   Ht 5' 10 (1.778 m)   Wt (!) 303 lb (137.4 kg)   SpO2 93% Comment: ra  BMI 43.48 kg/m  Wt Readings from Last 3 Encounters:  11/11/23 (!) 303 lb (137.4 kg)  10/01/23 (!) 305 lb 0.6 oz (138.4 kg)  08/20/23 (!) 302 lb 0.6 oz (137 kg)   BMI Readings from Last 3 Encounters:  11/11/23 43.48 kg/m  10/01/23 43.77 kg/m  08/20/23 43.34 kg/m   SpO2 Readings from Last 3 Encounters:  11/11/23 93%  10/01/23 92%  08/20/23 93%    Physical Exam General: NAD, alert, WD, WN Eyes: PERRL, no scleral icterus ENMT: oropharynx clear, good dentition, no oral lesions, mallampati score IV Skin: warm, intact, no rashes Neck: JVD difficult to assess due to body habitus, ROM and lymph node assessment normal, neck circ 18 inches CV: RRR, no MRG, nl S1 and S2, 1+ pitting peripheral edema Resp: clear to auscultation bilaterally, no wheezes, rales, or rhonchi, normal effort, no clubbing/cyanosis Abdom: Normoactive bowel sounds, soft, nontender, nondistended, no hepatosplenomegaly Neuro: Awake alert oriented to person place time and situation  Diagnostic Review:  Last CBC Lab Results  Component Value Date   WBC 8.4 01/16/2023   HGB 13.6 01/16/2023   HCT 42.1 01/16/2023   MCV 92.1 01/16/2023   MCH 29.8 01/16/2023   RDW 13.2 01/16/2023   PLT 170 01/16/2023   Last metabolic panel Lab Results  Component Value Date   GLUCOSE 89 08/20/2023   NA 140 08/20/2023   K 4.3 08/20/2023   CL 101 08/20/2023   CO2 22 08/20/2023   BUN 18 08/20/2023   CREATININE 0.89 08/20/2023   EGFR 95 08/20/2023   CALCIUM 10.3 (H) 08/20/2023   ANIONGAP 9 01/16/2023       Assessment & Plan:   Assessment & Plan Dyspnea on exertion Likely multifactorial in etiology. I suspect his weight has a lot to do with it. He also appears slightly hypervolemic on examination and thus may have diastolic heart failure. I ordered echocardiogram to evaluate his heart  function in preparation to upcoming cardiology appointment in 01/2024. The patient's wife reports that patient's symptoms started after recent knee surgery (01/2023) and so will order D dimer test to rule out VTE/PE. Other considerations include airways disease such as asthma or bronchiectasis. Initial work up include CXR, D-dimer, PFTs, and echocardiogram. If D dimer is +, will order CTA chest to rule out PE. Anemia is unlikely given lack of conjunctival pallor and recent Hgb of 13. Obstructive sleep apnea The patient has mild OSA based on HST. I will start him on autoPAP (5-15 cm H2O). Will bring him back for a compliance check after starting therapy. I also discussed weight loss recommendations and sleep hygiene with the patient. Morbid obesity with BMI of 40.0-44.9, adult [facility]) Discussed with the patient about the importance of maintaining a healthy weight and the positive impact it can have on lung function. Encouraged them to reduce caloric intake and increase activity.  Orders Placed This Encounter  Procedures   DG Chest 2 View   D-dimer, quantitative   AMB REFERRAL FOR DME   ECHOCARDIOGRAM COMPLETE   I spent 49 minutes reviewing patient's chart including prior consultant notes, imaging, and PFTs as well as face-to-face with the patient, over  half in discussion of the diagnosis and the importance of compliance with the treatment plan.  Return in about 2 months (around 01/11/2024).   Alma Muegge, MD

## 2023-11-12 ENCOUNTER — Other Ambulatory Visit: Payer: Self-pay

## 2023-11-12 ENCOUNTER — Ambulatory Visit: Payer: Self-pay | Admitting: Pulmonary Disease

## 2023-11-12 DIAGNOSIS — I5032 Chronic diastolic (congestive) heart failure: Secondary | ICD-10-CM

## 2023-11-12 DIAGNOSIS — E119 Type 2 diabetes mellitus without complications: Secondary | ICD-10-CM

## 2023-11-12 LAB — D-DIMER, QUANTITATIVE: D-DIMER: 0.47 mg{FEU}/L (ref 0.00–0.49)

## 2023-11-21 ENCOUNTER — Ambulatory Visit

## 2023-11-27 ENCOUNTER — Ambulatory Visit (HOSPITAL_COMMUNITY)
Admission: RE | Admit: 2023-11-27 | Discharge: 2023-11-27 | Disposition: A | Discharge status: Home / Self Care | Source: Ambulatory Visit

## 2023-11-27 ENCOUNTER — Other Ambulatory Visit: Payer: Self-pay

## 2023-11-27 ENCOUNTER — Telehealth: Payer: Self-pay

## 2023-11-27 DIAGNOSIS — R0609 Other forms of dyspnea: Secondary | ICD-10-CM | POA: Insufficient documentation

## 2023-11-27 LAB — ECHOCARDIOGRAM COMPLETE
AR max vel: 2.15 cm2
AV Area VTI: 2.89 cm2
AV Area mean vel: 2.19 cm2
AV Mean grad: 3.9 mmHg
AV Peak grad: 8.4 mmHg
Ao pk vel: 1.45 m/s
Area-P 1/2: 2.39 cm2
S' Lateral: 2.8 cm

## 2023-11-27 MED ORDER — FUROSEMIDE 20 MG PO TABS: 20.0000 mg | ORAL_TABLET | Freq: Every day | ORAL | 3 refills | Status: DC

## 2023-11-27 NOTE — Telephone Encounter (Signed)
 Inform pt of Dr Brett' recs. nfn

## 2023-11-27 NOTE — Progress Notes (Signed)
*  PRELIMINARY RESULTS* Echocardiogram 2D Echocardiogram has been performed.  Teresa Aida PARAS 11/27/2023, 9:15 AM

## 2023-11-30 ENCOUNTER — Emergency Department (HOSPITAL_COMMUNITY)
Admission: EM | Admit: 2023-11-30 | Discharge: 2023-11-30 | Disposition: A | Discharge status: Home / Self Care | Attending: Emergency Medicine | Admitting: Emergency Medicine

## 2023-11-30 ENCOUNTER — Other Ambulatory Visit: Payer: Self-pay

## 2023-11-30 ENCOUNTER — Emergency Department (HOSPITAL_COMMUNITY)

## 2023-11-30 ENCOUNTER — Encounter (HOSPITAL_COMMUNITY): Payer: Self-pay

## 2023-11-30 DIAGNOSIS — M542 Cervicalgia: Secondary | ICD-10-CM | POA: Diagnosis present

## 2023-11-30 DIAGNOSIS — M5412 Radiculopathy, cervical region: Secondary | ICD-10-CM | POA: Insufficient documentation

## 2023-11-30 DIAGNOSIS — R739 Hyperglycemia, unspecified: Secondary | ICD-10-CM | POA: Diagnosis not present

## 2023-11-30 DIAGNOSIS — Z7984 Long term (current) use of oral hypoglycemic drugs: Secondary | ICD-10-CM | POA: Diagnosis not present

## 2023-11-30 DIAGNOSIS — Z79899 Other long term (current) drug therapy: Secondary | ICD-10-CM | POA: Insufficient documentation

## 2023-11-30 LAB — COMPREHENSIVE METABOLIC PANEL WITH GFR
ALT: 48 U/L — ABNORMAL HIGH (ref 0–44)
AST: 36 U/L (ref 15–41)
Albumin: 3.6 g/dL (ref 3.5–5.0)
Alkaline Phosphatase: 34 U/L — ABNORMAL LOW (ref 38–126)
Anion gap: 11 (ref 5–15)
BUN: 19 mg/dL (ref 8–23)
CO2: 25 mmol/L (ref 22–32)
Calcium: 9.1 mg/dL (ref 8.9–10.3)
Chloride: 101 mmol/L (ref 98–111)
Creatinine, Ser: 0.93 mg/dL (ref 0.61–1.24)
GFR, Estimated: >60 mL/min (ref >60)
Glucose, Bld: 175 mg/dL — ABNORMAL HIGH (ref 70–99)
Potassium: 4.6 mmol/L (ref 3.5–5.1)
Sodium: 137 mmol/L (ref 135–145)
Total Bilirubin: 0.9 mg/dL (ref 0.0–1.2)
Total Protein: 6.6 g/dL (ref 6.5–8.1)

## 2023-11-30 LAB — CBC WITH DIFFERENTIAL/PLATELET
Abs Immature Granulocytes: 0.04 K/uL (ref 0.00–0.07)
Basophils Absolute: 0.1 K/uL (ref 0.0–0.1)
Basophils Relative: 1 %
Eosinophils Absolute: 0.2 K/uL (ref 0.0–0.5)
Eosinophils Relative: 5 %
HCT: 47.1 % (ref 39.0–52.0)
Hemoglobin: 15.8 g/dL (ref 13.0–17.0)
Immature Granulocytes: 1 %
Lymphocytes Relative: 34 %
Lymphs Abs: 1.7 K/uL (ref 0.7–4.0)
MCH: 30.4 pg (ref 26.0–34.0)
MCHC: 33.5 g/dL (ref 30.0–36.0)
MCV: 90.6 fL (ref 80.0–100.0)
Monocytes Absolute: 0.6 K/uL (ref 0.1–1.0)
Monocytes Relative: 12 %
Neutro Abs: 2.4 K/uL (ref 1.7–7.7)
Neutrophils Relative %: 47 %
Platelets: 182 K/uL (ref 150–400)
RBC: 5.2 MIL/uL (ref 4.22–5.81)
RDW: 13.6 % (ref 11.5–15.5)
WBC: 5 K/uL (ref 4.0–10.5)
nRBC: 0 % (ref 0.0–0.2)

## 2023-11-30 MED ORDER — IOHEXOL 300 MG/ML  SOLN
80.0000 mL | Freq: Once | INTRAMUSCULAR | Status: AC | PRN
  Administered 2023-11-30: 80 mL via INTRAVENOUS

## 2023-11-30 MED ORDER — KETOROLAC TROMETHAMINE 15 MG/ML IJ SOLN
15.0000 mg | Freq: Once | INTRAMUSCULAR | Status: AC
  Administered 2023-11-30: 15 mg via INTRAVENOUS
  Filled 2023-11-30: qty 1

## 2023-11-30 MED ORDER — HYDROMORPHONE HCL 1 MG/ML IJ SOLN
1.0000 mg | Freq: Once | INTRAMUSCULAR | Status: AC
  Administered 2023-11-30: 1 mg via INTRAVENOUS
  Filled 2023-11-30: qty 1

## 2023-11-30 MED ORDER — DICLOFENAC SODIUM 75 MG PO TBEC: 75.0000 mg | DELAYED_RELEASE_TABLET | Freq: Two times a day (BID) | ORAL | 0 refills | Status: DC

## 2023-11-30 MED ORDER — DEXAMETHASONE SODIUM PHOSPHATE 10 MG/ML IJ SOLN: 10.0000 mg | Freq: Once | INTRAMUSCULAR | Status: DC

## 2023-11-30 MED ORDER — METHOCARBAMOL 750 MG PO TABS: 750.0000 mg | ORAL_TABLET | Freq: Three times a day (TID) | ORAL | 0 refills | Status: DC

## 2023-11-30 MED ORDER — DIAZEPAM 5 MG/ML IJ SOLN
5.0000 mg | Freq: Once | INTRAMUSCULAR | Status: AC
  Administered 2023-11-30: 5 mg via INTRAVENOUS
  Filled 2023-11-30: qty 2

## 2023-11-30 NOTE — ED Triage Notes (Signed)
 Pt c/o of neck pain; onset approx. 2 weeks ago. Denies any injury related to symptoms; denies headache, N/V/D, no fever. A&Ox4. Pupils equal, reactive to light, accommodating and consensual. Pt has tried advil , icy hot, no relief

## 2023-11-30 NOTE — Discharge Instructions (Signed)
 Please follow-up closely with neurosurgery on an outpatient basis.  Return to emergency department immediately for any new or worsening symptoms.

## 2023-11-30 NOTE — ED Provider Notes (Signed)
 [place] [facility] DE[facility]MENT AT [facility] Provider Note   CSN: [id number] Arrival date & time: 11/30/23  9042     Patient presents with: Neck Pain   Ethan Oliver is a 68 y.o. male.   Patient is a 68 year old male who presents emergency department chief complaint of left-sided neck pain which has been ongoing for approximate the past 2 weeks.  He has been using IcyHot and taking Advil  with no improvement in his symptoms.  He notes he has had no recent falls or blunt trauma.  He does get some intermittent paresthesias in his hand but denies any other associated numbness.  He notes that the pain does not radiate to his left arm, chest and he has had no increased shortness of breath.  He denies any abnormal headaches, dizziness, lightheadedness.  He denies any associated abdominal pain, nausea, vomiting, diarrhea.  He has had no visual changes or hearing changes.  He denies any dysphagia or sore throat.   Neck Pain      Prior to Admission medications   Medication Sig Start Date End Date Taking? Authorizing Provider  albuterol  (VENTOLIN  HFA) 108 (90 Base) MCG/ACT inhaler INHALE 2 PUFFS BY MOUTH EVERY 6 HOURS AS NEEDED FOR WHEEZING OR SHORTNESS OF BREATH 10/16/23   Bevely Doffing, FNP  buPROPion (WELLBUTRIN XL) 150 MG 24 hr tablet Take 1 tablet by mouth daily. 06/10/23 06/09/24  [provider]  celecoxib  (CELEBREX ) 100 MG capsule Take 100 mg by mouth in the morning.    [provider]  Continuous Glucose Receiver (FREESTYLE LIBRE 3 READER) DEVI Use it to check blood glucose as instructed. 10/06/23   Bevely Doffing, FNP  Continuous Glucose Sensor (FREESTYLE LIBRE 3 SENSOR) MISC Use it to check blood glucose.  Apply new sensor every 14 days. 10/01/23   Bevely Doffing, FNP  furosemide  (LASIX ) 20 MG tablet Take 1 tablet (20 mg total) by mouth daily. 11/27/23   Alghanim, Paula, MD  glipiZIDE  (GLUCOTROL  XL) 5 MG 24 hr tablet TAKE 1 TABLET BY MOUTH ONCE DAILY IN THE EVENING  11/13/23   Bevely Doffing, FNP  hydrochlorothiazide  (HYDRODIURIL ) 12.5 MG tablet Take 12.5 mg by mouth daily. 01/31/18   [provider]  lisinopril  (ZESTRIL ) 20 MG tablet Take 20 mg by mouth in the morning. 06/20/21   [provider]  metFORMIN  (GLUCOPHAGE -XR) 750 MG 24 hr tablet Take 750 mg by mouth every morning. 12/26/22   [provider]  pravastatin  (PRAVACHOL ) 40 MG tablet Take 40 mg by mouth in the morning.    [provider]  tirzepatide  (MOUNJARO ) 7.5 MG/0.5ML Pen Inject 7.5 mg into the skin once a week. 10/01/23   Bevely Doffing, FNP    Allergies: Codeine    Review of Systems  Musculoskeletal:  Positive for neck pain.  All other systems reviewed and are negative.   Updated Vital Signs BP 124/83 (BP Location: Right Arm)   Pulse 81   Temp 97.9 F (36.6 C) (Oral)   Resp 19   Ht 5' 10 (1.778 m)   Wt 134.3 kg   SpO2 94%   BMI 42.47 kg/m   Physical Exam Vitals and nursing note reviewed.  Constitutional:      General: He is not in acute distress.    Appearance: Normal appearance. He is not ill-appearing.  HENT:     Head: Normocephalic and atraumatic.     Nose: Nose normal.     Mouth/Throat:     Mouth: Mucous membranes are  moist.  Eyes:     Extraocular Movements: Extraocular movements intact.     Conjunctiva/sclera: Conjunctivae normal.     Pupils: Pupils are equal, round, and reactive to light.  Neck:     Comments: Tenderness to palpation noted over the lateral aspect of the neck, mild midline tenderness, no step-off or deformity, no overlying swelling, erythema, warmth, no carotid bruit Cardiovascular:     Rate and Rhythm: Normal rate and regular rhythm.     Pulses: Normal pulses.     Heart sounds: Normal heart sounds. No murmur heard.    No gallop.  Pulmonary:     Effort: Pulmonary effort is normal. No respiratory distress.     Breath sounds: Normal breath sounds. No stridor. No wheezing, rhonchi or rales.  Musculoskeletal:         General: No swelling, tenderness, deformity or signs of injury. Normal range of motion.     Cervical back: Normal range of motion and neck supple.     Comments: Radial pulse 2+ in bilateral upper extremities, sensation intact distally, radial, ulnar, median, axillary nerve function intact distally  Skin:    General: Skin is warm and dry.  Neurological:     General: No focal deficit present.     Mental Status: He is alert and oriented to person, place, and time. Mental status is at baseline.     Cranial Nerves: No cranial nerve deficit.     Sensory: No sensory deficit.     Motor: No weakness.     Coordination: Coordination normal.     Gait: Gait normal.  Psychiatric:        Mood and Affect: Mood normal.        Behavior: Behavior normal.        Thought Content: Thought content normal.        Judgment: Judgment normal.     (all labs ordered are listed, but only abnormal results are displayed) Labs Reviewed  COMPREHENSIVE METABOLIC PANEL WITH GFR  CBC WITH DIFFERENTIAL/PLATELET    EKG: None  Radiology: No results found.   Procedures   Medications Ordered in the ED  diazepam  (VALIUM ) injection 5 mg (has no administration in time range)  ketorolac  (TORADOL ) 15 MG/ML injection 15 mg (has no administration in time range)                                    Medical Decision Making Amount and/or Complexity of Data Reviewed Labs: ordered. Radiology: ordered.  Risk Prescription drug management.   This patient presents to the ED for concern of neck pain differential diagnosis includes cervical radiculopathy, vertebral fracture, sprain, strain, malignancy    Additional history obtained:  Additional history obtained from none External records from outside source obtained and reviewed including none   Lab Tests:  I Ordered, and personally interpreted labs.  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  Symptoms have improved with treatment in the emergency department.  Discussed with patient I suspect symptoms are consistent with cervical radiculopathy.  Do not suspect an emergent MRI is warranted.  CT soft tissue neck was otherwise unremarkable demonstrated no signs of masses.  Blood work is otherwise unremarkable at this point other than some mild hyperglycemia.  Will avoid any steroids at this point.  Recommended close follow-up with neurosurgery on an outpatient basis as well as strict turn precautions for any new or worsening symptoms.  Patient voiced understanding and had no additional questions.   Social Determinants of Health:  None        Final diagnoses:  None    ED Discharge Orders     None          Daralene Lonni JONETTA DEVONNA 11/30/23 1410    Dean Clarity, MD 11/30/23 1531

## 2023-12-01 ENCOUNTER — Telehealth: Payer: Self-pay

## 2023-12-01 NOTE — Telephone Encounter (Signed)
 Copied from CRM [id number]. Topic: Appointments - Scheduling Inquiry for Clinic >> Dec 01, 2023  3:22 PM Sophia H wrote: Reason for CRM:  Hospital follow up needed - Cervical radiculopathy Nothing in clinic available till [facility] October, please reach out for scheduling. Ty # [phone]

## 2023-12-02 NOTE — Telephone Encounter (Signed)
 Called patient will keep 10/24 and add to a waiting list

## 2023-12-09 ENCOUNTER — Other Ambulatory Visit: Payer: Self-pay

## 2023-12-09 DIAGNOSIS — F39 Unspecified mood [affective] disorder: Secondary | ICD-10-CM

## 2023-12-09 MED ORDER — BUPROPION HCL ER (XL) 150 MG PO TB24: 150.0000 mg | ORAL_TABLET | Freq: Every day | ORAL | 3 refills | Status: AC

## 2023-12-11 ENCOUNTER — Other Ambulatory Visit: Payer: Self-pay

## 2023-12-11 DIAGNOSIS — I5032 Chronic diastolic (congestive) heart failure: Secondary | ICD-10-CM

## 2023-12-12 ENCOUNTER — Other Ambulatory Visit: Payer: Self-pay

## 2023-12-12 ENCOUNTER — Ambulatory Visit: Payer: Self-pay | Admitting: Pulmonary Disease

## 2023-12-12 LAB — BASIC METABOLIC PANEL WITH GFR
BUN/Creatinine Ratio: 16 (ref 10–24)
BUN: 23 mg/dL (ref 8–27)
CO2: 22 mmol/L (ref 20–29)
Calcium: 10.3 mg/dL — ABNORMAL HIGH (ref 8.6–10.2)
Chloride: 99 mmol/L (ref 96–106)
Creatinine, Ser: 1.42 mg/dL — ABNORMAL HIGH (ref 0.76–1.27)
Glucose: 142 mg/dL — ABNORMAL HIGH (ref 70–99)
Potassium: 4.7 mmol/L (ref 3.5–5.2)
Sodium: 138 mmol/L (ref 134–144)
eGFR: 54 mL/min/1.73 — ABNORMAL LOW (ref >59)

## 2023-12-12 LAB — MAGNESIUM: Magnesium: 2.2 mg/dL (ref 1.6–2.3)

## 2023-12-12 NOTE — Telephone Encounter (Signed)
 Copied from CRM [id number]. Topic: Clinical - Medication Refill >> Dec 12, 2023  9:17 AM Delon HERO wrote: Medication: lisinopril  (ZESTRIL ) 20 MG tablet [[id number]]  Has the patient contacted their pharmacy? Yes (Agent: If no, request that the patient contact the pharmacy for the refill. If patient does not wish to contact the pharmacy document the reason why and proceed with request.) (Agent: If yes, when and what did the pharmacy advise?)  This is the patient's preferred pharmacy:  [facility] [address], [place] - [address] [place] [address] Phone: [phone] Fax: [phone]  Is this the correct pharmacy for this prescription? Yes  If no, delete pharmacy and type the correct one.   Has the prescription been filled recently? Yes  Is the patient out of the medication? Yes Takes last pill tomorrow  Has the patient been seen for an appointment in the last year OR does the patient have an upcoming appointment? Yes  Can we respond through MyChart? Yes  Agent: Please be advised that Rx refills may take up to 3 business days. We ask that you follow-up with your pharmacy.

## 2023-12-12 NOTE — Progress Notes (Signed)
 I called patient and discussed result of CMP. His Cr is elevated after starting lasix  for diastolic heart failure. He takes lasix  20 mg every day. I advised him to use it three times a week, Mon, Wed, Friday. The patient agrees. I also discussed results of echocardiogram.

## 2023-12-15 MED ORDER — LISINOPRIL 20 MG PO TABS: 20.0000 mg | ORAL_TABLET | Freq: Every morning | ORAL | 1 refills | Status: AC

## 2023-12-15 NOTE — Telephone Encounter (Signed)
 Refill sent to pharmacy.

## 2023-12-19 ENCOUNTER — Telehealth (HOSPITAL_BASED_OUTPATIENT_CLINIC_OR_DEPARTMENT_OTHER): Payer: Self-pay | Admitting: *Deleted

## 2023-12-19 NOTE — Telephone Encounter (Signed)
 [facility] notified the office that they have made multiple attempts to contact the patient without success regarding medical equipment and services ordered.

## 2024-01-02 ENCOUNTER — Ambulatory Visit

## 2024-01-02 VITALS — BP 122/78 | HR 93 | Ht 70.0 in | Wt 303.0 lb

## 2024-01-02 DIAGNOSIS — Z7984 Long term (current) use of oral hypoglycemic drugs: Secondary | ICD-10-CM | POA: Diagnosis not present

## 2024-01-02 DIAGNOSIS — Z7985 Long-term (current) use of injectable non-insulin antidiabetic drugs: Secondary | ICD-10-CM

## 2024-01-02 DIAGNOSIS — E119 Type 2 diabetes mellitus without complications: Secondary | ICD-10-CM

## 2024-01-02 DIAGNOSIS — I10 Essential (primary) hypertension: Secondary | ICD-10-CM

## 2024-01-02 DIAGNOSIS — I5032 Chronic diastolic (congestive) heart failure: Secondary | ICD-10-CM

## 2024-01-02 NOTE — Progress Notes (Signed)
 Established Patient Office Visit  Subjective   Patient ID: Ethan Oliver, male    DOB: Mar 08, 1956  Age: 68 y.o. MRN: [id number]  Chief Complaint  Patient presents with   Medical Management of Chronic Issues    3 month follow up     HPI Discussed the use of AI scribe software for clinical note transcription with the patient, who gave verbal consent to proceed.  History of Present Illness   Ethan Oliver is a 68 year old male who presents for follow-up regarding his blood sugar management.  Hyperglycemia - Currently managed with Mounjaro  and metformin  - Recent prescription refills for both medications  Cervical radiculopathy and neck pain - Onset of sharp neck pain and numbness approximately four to five weeks ago - CT scan in the ER revealed a deteriorating cervical disc - Neurosurgical evaluation diagnosed severe cervical arthritis - Scheduled for cervical injections  Fluid retention and edema - Recent evaluation by pulmonology identified fluid accumulation around the heart and lungs - Prescribed furosemide  (Lasix ) for fluid management - Furosemide  dosage reduced due to excessive fluid loss      Patient Active Problem List   Diagnosis Date Noted   Chronic diastolic heart failure (HCC) 01/04/2024   SOB (shortness of breath) on exertion 10/10/2023   Obstructive sleep apnea hypopnea, moderate 10/01/2023   Status post total right knee replacement 01/15/23 01/31/2023   Primary osteoarthritis of right knee 01/15/2023   Osteoarthritis of right knee 01/15/2023   S/P TKR (total knee replacement), left 04/23/22 05/07/2022   Primary osteoarthritis of left knee 04/23/2022   Mood disorder 09/12/2021   Postlaminectomy syndrome of lumbar region 09/12/2021   Arthritis of left elbow 06/21/2019   Cubital tunnel syndrome on left 06/21/2019   Inflammation of left elbow 06/21/2019   Arthritis, lumbar spine 08/05/2018   Benign essential hypertension 11/07/2016   Generalized  osteoarthritis of multiple sites 11/07/2016   Type 2 diabetes mellitus without complication, without long-term current use of insulin  (HCC) 11/07/2016   Diastasis recti 11/07/2016   Pure hypercholesterolemia 11/07/2016   Morbid obesity with BMI of 40.0-44.9, adult (HCC) 12/27/2013   Back pain 12/27/2013   Degeneration of lumbar intervertebral disc 12/27/2013   Lumbar radiculopathy 12/27/2013   Lumbosacral spondylosis without myelopathy 12/28/2012      ROS    Objective:     BP 122/78   Pulse 93   Ht 5' 10 (1.778 m)   Wt (!) 303 lb (137.4 kg)   SpO2 92%   BMI 43.48 kg/m  BP Readings from Last 3 Encounters:  01/02/24 122/78  11/30/23 115/82  11/11/23 135/88   Wt Readings from Last 3 Encounters:  01/02/24 (!) 303 lb (137.4 kg)  11/30/23 296 lb (134.3 kg)  11/11/23 (!) 303 lb (137.4 kg)      Physical Exam Vitals and nursing note reviewed.  Constitutional:      Appearance: Normal appearance. He is obese.  HENT:     Head: Normocephalic.  Eyes:     Extraocular Movements: Extraocular movements intact.     Pupils: Pupils are equal, round, and reactive to light.  Cardiovascular:     Rate and Rhythm: Normal rate and regular rhythm.  Pulmonary:     Effort: Pulmonary effort is normal.     Breath sounds: Normal breath sounds.  Musculoskeletal:     Cervical back: Normal range of motion and neck supple.  Neurological:     Mental Status: He is alert and oriented to person, place,  and time.  Psychiatric:        Mood and Affect: Mood normal.        Thought Content: Thought content normal.        The 10-year ASCVD risk score (Arnett DK, et al., 2019) is: 24.1%    Assessment & Plan:   Problem List Items Addressed This Visit       Cardiovascular and Mediastinum   Benign essential hypertension   Blood pressure well-controlled.  Continue with current medications      Relevant Orders   Basic Metabolic Panel (BMET) (Completed)   Chronic diastolic heart failure  (HCC)   Heart Failure with Preserved Ejection Fraction (HFpEF) HFpEF with fluid retention managed by furosemide . Dosage adjusted due to excessive fluid loss. - Recheck blood work to monitor fluid status and kidney function. - Continue furosemide  with adjusted dosage. - Follow up with cardiologist Dr. Alvan on November 24.        Endocrine   Type 2 diabetes mellitus without complication, without long-term current use of insulin  (HCC) - Primary   Diabetes managed with Mounjaro  and metformin . No issues with medication refills. - Continue current diabetes medications (Mounjaro  and metformin ).      Relevant Orders   Basic Metabolic Panel (BMET) (Completed)   HgB A1c (Completed)    No follow-ups on file.    Leita Longs, FNP

## 2024-01-03 LAB — BASIC METABOLIC PANEL WITH GFR
BUN/Creatinine Ratio: 19 (ref 10–24)
BUN: 19 mg/dL (ref 8–27)
CO2: 23 mmol/L (ref 20–29)
Calcium: 9.8 mg/dL (ref 8.6–10.2)
Chloride: 96 mmol/L (ref 96–106)
Creatinine, Ser: 1.02 mg/dL (ref 0.76–1.27)
Glucose: 88 mg/dL (ref 70–99)
Potassium: 4.6 mmol/L (ref 3.5–5.2)
Sodium: 135 mmol/L (ref 134–144)
eGFR: 81 mL/min/1.73 (ref >59)

## 2024-01-03 LAB — HEMOGLOBIN A1C
Est. average glucose Bld gHb Est-mCnc: 143 mg/dL
Hgb A1c MFr Bld: 6.6 % — ABNORMAL HIGH (ref 4.8–5.6)

## 2024-01-04 DIAGNOSIS — I5032 Chronic diastolic (congestive) heart failure: Secondary | ICD-10-CM | POA: Insufficient documentation

## 2024-01-04 NOTE — Assessment & Plan Note (Signed)
 Blood pressure well-controlled.  Continue with current medications

## 2024-01-04 NOTE — Assessment & Plan Note (Signed)
 Diabetes managed with Mounjaro  and metformin . No issues with medication refills. - Continue current diabetes medications (Mounjaro  and metformin ).

## 2024-01-04 NOTE — Assessment & Plan Note (Signed)
 Heart Failure with Preserved Ejection Fraction (HFpEF) HFpEF with fluid retention managed by furosemide . Dosage adjusted due to excessive fluid loss. - Recheck blood work to monitor fluid status and kidney function. - Continue furosemide  with adjusted dosage. - Follow up with cardiologist Dr. Alvan on November 24.

## 2024-01-15 ENCOUNTER — Ambulatory Visit: Payer: PPO | Admitting: Orthopedic Surgery

## 2024-01-15 ENCOUNTER — Encounter: Payer: Self-pay | Admitting: Orthopedic Surgery

## 2024-01-15 ENCOUNTER — Other Ambulatory Visit (INDEPENDENT_AMBULATORY_CARE_PROVIDER_SITE_OTHER): Payer: Self-pay

## 2024-01-15 ENCOUNTER — Other Ambulatory Visit: Payer: Self-pay

## 2024-01-15 DIAGNOSIS — Z96652 Presence of left artificial knee joint: Secondary | ICD-10-CM

## 2024-01-15 DIAGNOSIS — Z96651 Presence of right artificial knee joint: Secondary | ICD-10-CM

## 2024-01-15 DIAGNOSIS — M170 Bilateral primary osteoarthritis of knee: Secondary | ICD-10-CM

## 2024-01-15 NOTE — Progress Notes (Signed)
    01/15/2024   Chief Complaint  Patient presents with   Post-op Follow-up    Both knees replaced     Encounter Diagnoses  Name Primary?   Status post total right knee replacement 01/15/23 Yes   S/P TKR (total knee replacement), left 04/23/22     What pharmacy do you use ? ___[facility] 14________________________   Did you get better, worse or no change (Answer below)   Improved

## 2024-01-15 NOTE — Progress Notes (Signed)
 ANNUAL FOLLOW UP FOR  RT / LT  TKA   Chief Complaint  Patient presents with   Post-op Follow-up    Both knees replaced      HPI: The patient is here for the annual  follow-up x-ray for knee replacement. The patient is not complaining of pain weakness instability or stiffness in the repaired knee.   ROS NORMAL      Examination of the RIGHT  KNEE  There were no vitals taken for this visit. General the patient is normally groomed in no distress Inspection shows :RT AND LEFT  incision healed nicely without erythema, no tenderness no swelling Range of motion total range of motion is 125, LEFT KNEE 120 Stability the RT LT knee is stable anterior to posterior as well as medial to lateral Strength quadriceps strength is normal IN BOTH  Skin no erythema around the skin incision B/L  Neuro: normal sensation in the operative leg  Gait: normal expected gait without cane    Medical decision-making section X-rays ordered, internal imaging shows (see full dictated report) stable implant with no signs of loosening DG Knee AP/LAT W/Sunrise Right Result Date: 01/15/2024 Radiology report 3 views RIGHT  knee AP lateral and patellar sunrise views of the knee FINDINGS: A total knee prosthesis is noted. It is in anatomic alignment. There is no evidence of loosening. Impression : normal TKA  xray    DG Knee AP/LAT W/Sunrise Left Result Date: 01/15/2024 Radiology report 3 views LEFT knee AP lateral and patellar sunrise views of the knee FINDINGS: A total knee prosthesis is noted. It is in anatomic alignment. There is no evidence of loosening. Impression : normal TKA  xray      Diagnosis  Encounter Diagnoses  Name Primary?   Status post total right knee replacement 01/15/23 Yes   S/P TKR (total knee replacement), left 04/23/22    Bilateral primary osteoarthritis of knee      Plan follow-up 2 year repeat x-rays

## 2024-01-19 ENCOUNTER — Encounter: Payer: Self-pay | Admitting: Cardiology

## 2024-01-19 ENCOUNTER — Ambulatory Visit: Attending: Cardiology | Admitting: Cardiology

## 2024-01-19 ENCOUNTER — Ambulatory Visit

## 2024-01-19 VITALS — BP 124/86 | HR 89 | Ht 70.0 in | Wt 305.8 lb

## 2024-01-19 DIAGNOSIS — I10 Essential (primary) hypertension: Secondary | ICD-10-CM | POA: Diagnosis not present

## 2024-01-19 DIAGNOSIS — R0609 Other forms of dyspnea: Secondary | ICD-10-CM

## 2024-01-19 NOTE — Progress Notes (Signed)
 Clinical Summary [name] is a 68 y.o.male seen today as a new consult, referred by NP Huenink for the following medical problems.    1.DOE - symptomst started 2-3 months ago - DOE with activities. DOE with 1 flight of stairs at home, taking shower, DOE at about 1 block - no chest pains. No cough or wheezing. No tobacco history - can have some LE edema at times. - Weights overall stable 303 lbs - lasix  started, Cr went from 0.93 to 1.42. Lasix  changed to Mon, Wed, Fri with normalized Cr - breathing is unchanged  11/2023 echo: 55-60%, no WMAs, grade I dd, low normal RV fucntion, mild RVE, inadequate TR jet to assess PA pressure, RA pressure 8 - seen by pulmonary.CXR: no acute process.Ddimer negative   2.OSA - diagnosed with sleep apnea, awaiting cpap    Past Medical History:  Diagnosis Date   Allergy    Arthritis    Chronic back pain    Depression    Diabetes mellitus without complication (HCC)    History of gout    History of kidney stones    Hypercholesteremia    Hypertension    PONV (postoperative nausea and vomiting)      Allergies  Allergen Reactions   Codeine Nausea Only     Current Outpatient Medications  Medication Sig Dispense Refill   albuterol  (VENTOLIN  HFA) 108 (90 Base) MCG/ACT inhaler INHALE 2 PUFFS BY MOUTH EVERY 6 HOURS AS NEEDED FOR WHEEZING OR SHORTNESS OF BREATH 18 g 0   buPROPion (WELLBUTRIN XL) 150 MG 24 hr tablet Take 1 tablet (150 mg total) by mouth daily. 90 tablet 3   celecoxib  (CELEBREX ) 100 MG capsule Take 100 mg by mouth in the morning.     Continuous Glucose Receiver (FREESTYLE LIBRE 3 READER) DEVI Use it to check blood glucose as instructed. 1 each 0   Continuous Glucose Sensor (FREESTYLE LIBRE 3 SENSOR) MISC Use it to check blood glucose.  Apply new sensor every 14 days. 6 each 3   diclofenac  (VOLTAREN ) 75 MG EC tablet Take 1 tablet (75 mg total) by mouth 2 (two) times daily. 20 tablet 0   furosemide  (LASIX ) 20 MG tablet Take  1 tablet (20 mg total) by mouth daily. 30 tablet 3   glipiZIDE  (GLUCOTROL  XL) 5 MG 24 hr tablet TAKE 1 TABLET BY MOUTH ONCE DAILY IN THE EVENING 90 tablet 0   hydrochlorothiazide  (HYDRODIURIL ) 12.5 MG tablet Take 12.5 mg by mouth daily.  3   lisinopril  (ZESTRIL ) 20 MG tablet Take 1 tablet (20 mg total) by mouth in the morning. 90 tablet 1   metFORMIN  (GLUCOPHAGE -XR) 750 MG 24 hr tablet Take 750 mg by mouth every morning.     methocarbamol  (ROBAXIN ) 750 MG tablet Take 1 tablet (750 mg total) by mouth 3 (three) times daily. 21 tablet 0   pravastatin  (PRAVACHOL ) 40 MG tablet Take 40 mg by mouth in the morning.     tirzepatide  (MOUNJARO ) 7.5 MG/0.5ML Pen Inject 7.5 mg into the skin once a week. 6 mL 1   No current facility-administered medications for this visit.     Past Surgical History:  Procedure Laterality Date   ACHILLES TENDON SURGERY Left 12/29/2013   Procedure: PERCUTANEOUS TENDO-ACHILLES LENGTHENING;  Surgeon: Morene Donley Anon, DPM;  Location: [facility];  Service: Podiatry;  Laterality: Left;   CALCANEAL OSTEOTOMY WITH ILIAC CREST BONE GRAFT AND REPAIR SUBLEXING TENDON AND ACHILLES TENDON Left 12/29/2013   Procedure: CALCANEOCUBOID JOINT  FUSION PERFORMED WITH BONE GRAFT;  Surgeon: Morene Donley Anon, DPM;  Location: [facility];  Service: Podiatry;  Laterality: Left;   CARPAL TUNNEL RELEASE Bilateral    COLONOSCOPY WITH PROPOFOL  N/A 05/02/2021   Procedure: COLONOSCOPY WITH PROPOFOL ;  Surgeon: Golda Claudis PENNER, MD;  Location: [facility];  Service: Endoscopy;  Laterality: N/A;  930   Disk fusion     lumbar   FLEXOR TENOTOMY  Right 02/18/2018   Procedure: PERCUTANEOUS FLEXOR TENOTOMY/APPLICATION OF BK POSTERIOR SPLINT RLE;  Surgeon: Anon Morene, DPM;  Location: [facility];  Service: Podiatry;  Laterality: Right;   FOOT ARTHRODESIS Left 12/29/2013   Procedure: TRIPLE ARTHRODESIS FOOT;  Surgeon: Morene Donley Anon, DPM;  Location: [facility];  Service: Podiatry;  Laterality: Left;    FOOT ARTHRODESIS Right 02/18/2018   Procedure: TRIPLE ARTHRODESIS RIGHT FOOT;  Surgeon: Anon Morene, DPM;  Location: [facility];  Service: Podiatry;  Laterality: Right;   JOINT REPLACEMENT     KNEE SURGERY Right    Surgery on this knee twice.    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   Procedure: TOTAL KNEE ARTHROPLASTY;  Surgeon: Margrette Taft BRAVO, MD;  Location: [facility];  Service: Orthopedics;  Laterality: Left;   TOTAL KNEE ARTHROPLASTY Right 01/15/2023   Procedure: TOTAL KNEE ARTHROPLASTY;  Surgeon: Margrette Taft BRAVO, MD;  Location: [facility];  Service: Orthopedics;  Laterality: Right;     Allergies  Allergen Reactions   Codeine Nausea Only      Family History  Problem Relation Age of Onset   Heart disease Mother    Hyperlipidemia Mother    Hypertension Mother    Arthritis Father    Diabetes Father    Heart disease Father    Hyperlipidemia Father    Hypertension Father    Heart disease Sister    Hyperlipidemia Sister    Hypertension Sister      Social History [name] reports that he has never smoked. He quit smokeless tobacco use about 10 years ago.  His smokeless tobacco use included chew. [name] reports no history of alcohol use.    Physical Examination Today's Vitals   01/19/24 1550  BP: 124/86  Pulse: 89  SpO2: 95%  Weight: (!) 305 lb 12.8 oz (138.7 kg)  Height: 5' 10 (1.778 m)   Body mass index is 43.88 kg/m.  Gen: resting comfortably, no acute distress HEENT: no scleral icterus, pupils equal round and reactive, no palptable cervical adenopathy,  CV: RRR, no mrg, no jvd Resp: Clear to auscultation bilaterally GI: abdomen is soft, non-tender, non-distended, normal bowel sounds, no hepatosplenomegaly MSK: extremities are warm, no edema.  Skin: warm, no rash Neuro:  no focal  deficits Psych: appropriate affect   Diagnostic Studies     Assessment and Plan   1.DOE -unclear etiology - fairly mild echo findings with grade I dd. Mild RV enlargement suspect related to OSA - symptoms not improved on lasix , AKI as well initially on lasix . Would suggest symptoms not primarily related to diastolic HF - check PFTs - if benign consider stress PET, has multiple CAD risk factors including strong family history and DM2. - EKG today shows NSR       Dorn PHEBE Ross, M.D.

## 2024-01-19 NOTE — Patient Instructions (Signed)
 Medication Instructions:   Continue all current medications.   Labwork:  none  Testing/Procedures:  Your physician has recommended that you have a pulmonary function test. Pulmonary Function Tests are a group of tests that measure how well air moves in and out of your lungs.   Follow-Up:  4 weeks    Any Other Special Instructions Will Be Listed Below (If Applicable).   If you need a refill on your cardiac medications before your next appointment, please call your pharmacy.

## 2024-01-26 NOTE — Telephone Encounter (Signed)
 Last appt 10/25/ Asking for refill on celebrex ? Pls advise

## 2024-01-27 ENCOUNTER — Other Ambulatory Visit: Payer: Self-pay

## 2024-01-27 ENCOUNTER — Ambulatory Visit (HOSPITAL_COMMUNITY)

## 2024-01-27 DIAGNOSIS — M159 Polyosteoarthritis, unspecified: Secondary | ICD-10-CM

## 2024-01-27 MED ORDER — CELECOXIB 100 MG PO CAPS: 100.0000 mg | ORAL_CAPSULE | Freq: Every morning | ORAL | 3 refills | Status: AC

## 2024-01-28 ENCOUNTER — Ambulatory Visit (HOSPITAL_COMMUNITY)
Admission: RE | Admit: 2024-01-28 | Discharge: 2024-01-28 | Disposition: A | Discharge status: Home / Self Care | Source: Ambulatory Visit | Attending: Cardiology | Admitting: Cardiology

## 2024-01-28 DIAGNOSIS — R0609 Other forms of dyspnea: Secondary | ICD-10-CM | POA: Insufficient documentation

## 2024-01-28 LAB — PULMONARY FUNCTION TEST
DL/VA % pred: 109 %
DL/VA: 4.47 ml/min/mmHg/L
DLCO unc % pred: 97 %
DLCO unc: 26.82 ml/min/mmHg
FEF 25-75 Post: 2.14 L/s
FEF 25-75 Pre: 2.1 L/s
FEF2575-%Change-Post: 1 %
FEF2575-%Pred-Post: 77 %
FEF2575-%Pred-Pre: 75 %
FEV1-%Change-Post: 0 %
FEV1-%Pred-Post: 73 %
FEV1-%Pred-Pre: 72 %
FEV1-Post: 2.59 L
FEV1-Pre: 2.58 L
FEV1FVC-%Change-Post: 3 %
FEV1FVC-%Pred-Pre: 101 %
FEV6-%Change-Post: -2 %
FEV6-%Pred-Post: 73 %
FEV6-%Pred-Pre: 74 %
FEV6-Post: 3.29 L
FEV6-Pre: 3.37 L
FEV6FVC-%Change-Post: 0 %
FEV6FVC-%Pred-Post: 105 %
FEV6FVC-%Pred-Pre: 104 %
FVC-%Change-Post: -2 %
FVC-%Pred-Post: 69 %
FVC-%Pred-Pre: 71 %
FVC-Post: 3.3 L
FVC-Pre: 3.4 L
Post FEV1/FVC ratio: 78 %
Post FEV6/FVC ratio: 100 %
Pre FEV1/FVC ratio: 76 %
Pre FEV6/FVC Ratio: 99 %
RV % pred: 107 %
RV: 2.61 L
TLC % pred: 88 %
TLC: 6.42 L

## 2024-01-28 MED ORDER — ALBUTEROL SULFATE (2.5 MG/3ML) 0.083% IN NEBU
2.5000 mg | INHALATION_SOLUTION | Freq: Once | RESPIRATORY_TRACT | Status: AC
  Administered 2024-01-28: 2.5 mg via RESPIRATORY_TRACT

## 2024-01-29 ENCOUNTER — Ambulatory Visit: Admitting: Cardiology

## 2024-02-04 ENCOUNTER — Ambulatory Visit: Admitting: Cardiology

## 2024-02-08 ENCOUNTER — Ambulatory Visit: Payer: Self-pay | Admitting: Cardiology

## 2024-03-02 ENCOUNTER — Ambulatory Visit: Attending: Cardiology | Admitting: Cardiology

## 2024-03-02 ENCOUNTER — Encounter: Payer: Self-pay | Admitting: Cardiology

## 2024-03-02 ENCOUNTER — Encounter: Payer: Self-pay | Admitting: *Deleted

## 2024-03-02 VITALS — BP 110/80 | HR 86 | Ht 70.0 in | Wt 302.6 lb

## 2024-03-02 DIAGNOSIS — M79606 Pain in leg, unspecified: Secondary | ICD-10-CM

## 2024-03-02 DIAGNOSIS — R0609 Other forms of dyspnea: Secondary | ICD-10-CM

## 2024-03-02 DIAGNOSIS — I5032 Chronic diastolic (congestive) heart failure: Secondary | ICD-10-CM

## 2024-03-02 MED ORDER — FUROSEMIDE 20 MG PO TABS: 20.0000 mg | ORAL_TABLET | ORAL | Status: AC

## 2024-03-02 NOTE — Progress Notes (Addendum)
 "     Clinical Summary Mr. Troop is a 68 y.o.male seen today for follow up of the following medical problems.   1.DOE - symptomst started 2-3 months ago - DOE with activities. DOE with 1 flight of stairs at home, taking shower, DOE at about 1 block - no chest pains. No cough or wheezing. No tobacco history - can have some LE edema at times. - Weights overall stable 303 lbs - lasix  started, Cr went from 0.93 to 1.42. Lasix  changed to Mon, Wed, Fri with normalized Cr - breathing is unchanged   11/2023 echo: 55-60%, no WMAs, grade I dd, low normal RV fucntion, mild RVE, inadequate TR jet to assess PA pressure, RA pressure 8 - seen by pulmonary.CXR: no acute process.Ddimer negative  01/2024 PFTs: mild obstruction   2.OSA - diagnosed with sleep apnea, awaiting cpap  3. Leg pains - cramping leg pains with ambulation Past Medical History:  Diagnosis Date   Allergy    Arthritis    Chronic back pain    Depression    Diabetes mellitus without complication (HCC)    History of gout    History of kidney stones    Hypercholesteremia    Hypertension    PONV (postoperative nausea and vomiting)      Allergies[1]   Current Outpatient Medications  Medication Sig Dispense Refill   albuterol  (VENTOLIN  HFA) 108 (90 Base) MCG/ACT inhaler INHALE 2 PUFFS BY MOUTH EVERY 6 HOURS AS NEEDED FOR WHEEZING OR SHORTNESS OF BREATH 18 g 0   buPROPion  (WELLBUTRIN  XL) 150 MG 24 hr tablet Take 1 tablet (150 mg total) by mouth daily. 90 tablet 3   celecoxib  (CELEBREX ) 100 MG capsule Take 1 capsule (100 mg total) by mouth in the morning. 90 capsule 3   Continuous Glucose Receiver (FREESTYLE LIBRE 3 READER) DEVI Use it to check blood glucose as instructed. 1 each 0   Continuous Glucose Sensor (FREESTYLE LIBRE 3 SENSOR) MISC Use it to check blood glucose.  Apply new sensor every 14 days. 6 each 3   furosemide  (LASIX ) 20 MG tablet Take 1 tablet (20 mg total) by mouth daily. (Patient taking differently: Take 20  mg by mouth every other day. Monday, Wednesday and Friday) 30 tablet 3   glipiZIDE  (GLUCOTROL  XL) 5 MG 24 hr tablet TAKE 1 TABLET BY MOUTH ONCE DAILY IN THE EVENING 90 tablet 0   hydrochlorothiazide  (HYDRODIURIL ) 12.5 MG tablet Take 12.5 mg by mouth daily.  3   lisinopril  (ZESTRIL ) 20 MG tablet Take 1 tablet (20 mg total) by mouth in the morning. 90 tablet 1   metFORMIN  (GLUCOPHAGE -XR) 750 MG 24 hr tablet Take 750 mg by mouth every morning.     pravastatin  (PRAVACHOL ) 40 MG tablet Take 40 mg by mouth in the morning.     tirzepatide  (MOUNJARO ) 7.5 MG/0.5ML Pen Inject 7.5 mg into the skin once a week. 6 mL 1   No current facility-administered medications for this visit.     Past Surgical History:  Procedure Laterality Date   ACHILLES TENDON SURGERY Left 12/29/2013   Procedure: PERCUTANEOUS TENDO-ACHILLES LENGTHENING;  Surgeon: Morene Donley Anon, DPM;  Location: [facility];  Service: Podiatry;  Laterality: Left;   CALCANEAL OSTEOTOMY WITH ILIAC CREST BONE GRAFT AND REPAIR SUBLEXING TENDON AND ACHILLES TENDON Left 12/29/2013   Procedure: CALCANEOCUBOID JOINT FUSION PERFORMED WITH BONE GRAFT;  Surgeon: Morene Donley Anon, DPM;  Location: [facility];  Service: Podiatry;  Laterality: Left;   CARPAL TUNNEL RELEASE Bilateral  COLONOSCOPY WITH PROPOFOL  N/A 05/02/2021   Procedure: COLONOSCOPY WITH PROPOFOL ;  Surgeon: Golda Claudis PENNER, MD;  Location: [facility];  Service: Endoscopy;  Laterality: N/A;  930   Disk fusion     lumbar   FLEXOR TENOTOMY  Right 02/18/2018   Procedure: PERCUTANEOUS FLEXOR TENOTOMY/APPLICATION OF BK POSTERIOR SPLINT RLE;  Surgeon: Blinda Katz, DPM;  Location: [facility];  Service: Podiatry;  Laterality: Right;   FOOT ARTHRODESIS Left 12/29/2013   Procedure: TRIPLE ARTHRODESIS FOOT;  Surgeon: Katz Donley Blinda, DPM;  Location: [facility];  Service: Podiatry;  Laterality: Left;   FOOT ARTHRODESIS Right 02/18/2018   Procedure: TRIPLE ARTHRODESIS RIGHT FOOT;  Surgeon:  Blinda Katz, DPM;  Location: [facility];  Service: Podiatry;  Laterality: Right;   JOINT REPLACEMENT     KNEE SURGERY Right    Surgery on this knee twice.    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   Procedure: TOTAL KNEE ARTHROPLASTY;  Surgeon: Margrette Taft BRAVO, MD;  Location: [facility];  Service: Orthopedics;  Laterality: Left;   TOTAL KNEE ARTHROPLASTY Right 01/15/2023   Procedure: TOTAL KNEE ARTHROPLASTY;  Surgeon: Margrette Taft BRAVO, MD;  Location: [facility];  Service: Orthopedics;  Laterality: Right;     Allergies[2]    Family History  Problem Relation Age of Onset   Heart disease Mother    Hyperlipidemia Mother    Hypertension Mother    Arthritis Father    Diabetes Father    Heart disease Father    Hyperlipidemia Father    Hypertension Father    Heart disease Sister    Hyperlipidemia Sister    Hypertension Sister      Social History Mr. Mccowen reports that he has never smoked. He quit smokeless tobacco use about 10 years ago.  His smokeless tobacco use included chew. Mr. Gingerich reports no history of alcohol use.    Physical Examination Today's Vitals   03/02/24 0906  BP: 110/80  Pulse: 86  SpO2: 94%  Weight: (!) 302 lb 9.6 oz (137.3 kg)  Height: 5' 10 (1.778 m)   Body mass index is 43.42 kg/m.  Gen: resting comfortably, no acute distress HEENT: no scleral icterus, pupils equal round and reactive, no palptable cervical adenopathy,  CV: RRR, no m/rg no jvd Resp: Clear to auscultation bilaterally GI: abdomen is soft, non-tender, non-distended, normal bowel sounds, no hepatosplenomegaly MSK: extremities are warm, no edema.  Skin: warm, no rash Neuro:  no focal deficits Psych: appropriate affect   Diagnostic Studies     Assessment and Plan   1.DOE -unclear etiology -extensive workup as  outlined above has been benign - plan for stress PET to evaluate for possible ischemia as the cause of his DOE. Certaintly with is diabetes his DOE could be an anginal equivalent  2.Leg pains - cramping leg pains with ambulation, check ABI   Dorn PHEBE Ross, M.D     [1]  Allergies Allergen Reactions   Codeine Nausea Only  [2]  Allergies Allergen Reactions   Codeine Nausea Only   "

## 2024-03-02 NOTE — Patient Instructions (Addendum)
 Medication Instructions:   Continue all current medications.   Labwork:  none  Testing/Procedures:  Your physician has requested that you have an ankle brachial index (ABI). During this test an ultrasound and blood pressure cuff are used to evaluate the arteries that supply the arms and legs with blood. Allow thirty minutes for this exam. There are no restrictions or special instructions.  Please note: We ask at that you not bring children with you during ultrasound (echo/ vascular) testing. Due to room size and safety concerns, children are not allowed in the ultrasound rooms during exams. Our front office staff cannot provide observation of children in our lobby area while testing is being conducted. An adult accompanying a patient to their appointment will only be allowed in the ultrasound room at the discretion of the ultrasound technician under special circumstances. We apologize for any inconvenience.  Cardiac PET scan  Office will contact with results via phone, letter or mychart.     Follow-Up:  3 months   Any Other Special Instructions Will Be Listed Below (If Applicable).   If you need a refill on your cardiac medications before your next appointment, please call your pharmacy.

## 2024-03-05 ENCOUNTER — Ambulatory Visit: Admitting: Cardiology

## 2024-03-10 ENCOUNTER — Other Ambulatory Visit: Payer: Self-pay

## 2024-03-15 ENCOUNTER — Ambulatory Visit

## 2024-03-15 VITALS — Ht 70.0 in | Wt 297.0 lb

## 2024-03-15 DIAGNOSIS — Z1159 Encounter for screening for other viral diseases: Secondary | ICD-10-CM

## 2024-03-15 DIAGNOSIS — E119 Type 2 diabetes mellitus without complications: Secondary | ICD-10-CM

## 2024-03-15 DIAGNOSIS — Z0000 Encounter for general adult medical examination without abnormal findings: Secondary | ICD-10-CM | POA: Diagnosis not present

## 2024-03-15 NOTE — Patient Instructions (Addendum)
 [name],  Thank you for taking the time for your Medicare Wellness Visit. I appreciate your continued commitment to your health goals. Please review the care plan we discussed, and feel free to reach out if I can assist you further.  Please note that Annual Wellness Visits do not include a physical exam. Some assessments may be limited, especially if the visit was conducted virtually. If needed, we may recommend an in-person follow-up with your provider.  Ongoing Care Seeing your primary care provider every 3 to 6 months helps us  monitor your health and provide consistent, personalized care.   1 year follow up for Medicare well visit: March 16, 2025 at 8:00 am with medicare wellness nurse in office  Referrals If a referral was made during today's visit and you haven't received any updates within two weeks, please contact the referred provider directly to check on the status.  Have the labs ordered during todays visit completed at [facility] lab. No appointment needed.   Recommended Screenings:  Health Maintenance  Topic Date Due   Complete foot exam   Never done   Eye exam for diabetics  Never done   Yearly kidney health urinalysis for diabetes  Never done   Hepatitis C Screening  Never done   DTaP/Tdap/Td vaccine (1 - Tdap) Never done   Pneumococcal Vaccine for age over 5 (1 of 2 - PCV) Never done   Zoster (Shingles) Vaccine (1 of 2) Never done   Medicare Annual Wellness Visit  10/30/2022   Flu Shot  10/10/2023   COVID-19 Vaccine (4 - 2025-26 season) 11/10/2023   Hemoglobin A1C  07/02/2024   Yearly kidney function blood test for diabetes  01/01/2025   Colon Cancer Screening  05/02/2028   Meningitis B Vaccine  Aged Out       03/15/2024    8:12 AM  Advanced Directives  Does Patient Have a Medical Advance Directive? Yes  Type of Estate Agent of [place];Living will  Copy of Healthcare Power of Attorney in Chart? No - copy requested    Vision: Annual vision  screenings are recommended for early detection of glaucoma, cataracts, and diabetic retinopathy. These exams can also reveal signs of chronic conditions such as diabetes and high blood pressure.  Dental: Annual dental screenings help detect early signs of oral cancer, gum disease, and other conditions linked to overall health, including heart disease and diabetes.  Please see the attached documents for additional preventive care recommendations.

## 2024-03-15 NOTE — Progress Notes (Signed)
 "  Patient is past due for a diabetic foot exam. Please complete at January 28th visit.  Chief Complaint  Patient presents with   Medicare Wellness     Subjective:   Ethan Oliver is a 69 y.o. male who presents for a Medicare Annual Wellness Visit.  Visit info / Clinical Intake: Medicare Wellness Visit Type:: Subsequent Annual Wellness Visit Persons participating in visit and providing information:: patient Medicare Wellness Visit Mode:: Telephone If telephone:: video error Since this visit was completed virtually, some vitals may be partially provided or unavailable. Missing vitals are due to the limitations of the virtual format.: Documented vitals are patient reported If Telephone or Video please confirm:: I connected with patient using audio/video enable telemedicine. I verified patient identity with two identifiers, discussed telehealth limitations, and patient agreed to proceed. Patient Location:: home Provider Location:: office Interpreter Needed?: No Pre-visit prep was completed: yes AWV questionnaire completed by patient prior to visit?: no Living arrangements:: lives with spouse/significant other Patient's Overall Health Status Rating: good Typical amount of pain: some Does pain affect daily life?: no Are you currently prescribed opioids?: no  Dietary Habits and Nutritional Risks How many meals a day?: 2 Eats fruit and vegetables daily?: yes Most meals are obtained by: preparing own meals; having others provide food In the last 2 weeks, have you had any of the following?: none Diabetic:: (!) yes Any non-healing wounds?: no How often do you check your BS?: continuous glucose monitor Would you like to be referred to a Nutritionist or for Diabetic Management? : no  Functional Status Activities of Daily Living (to include ambulation/medication): Independent Ambulation: Independent Medication Administration: Independent Home Management (perform basic housework or  laundry): Independent Manage your own finances?: yes Primary transportation is: driving Concerns about vision?: no *vision screening is required for WTM* Concerns about hearing?: no  Fall Screening Falls in the past year?: 0 Number of falls in past year: 0 Was there an injury with Fall?: 0 Fall Risk Category Calculator: 0 Patient Fall Risk Level: Low Fall Risk  Fall Risk Patient at Risk for Falls Due to: No Fall Risks Fall risk Follow up: Falls evaluation completed; Education provided; Falls prevention discussed  Home and Transportation Safety: All rugs have non-skid backing?: yes All stairs or steps have railings?: yes Grab bars in the bathtub or shower?: (!) no Have non-skid surface in bathtub or shower?: yes Good home lighting?: yes Regular seat belt use?: yes Hospital stays in the last year:: no  Cognitive Assessment Difficulty concentrating, remembering, or making decisions? : no Will 6CIT or Mini Cog be Completed: yes What year is it?: 0 points What month is it?: 0 points Give patient an address phrase to remember (5 components): [address] [place] About what time is it?: 0 points Count backwards from 20 to 1: 0 points Say the months of the year in reverse: 0 points Repeat the address phrase from earlier: 0 points 6 CIT Score: 0 points  Advance Directives (For Healthcare) Does Patient Have a Medical Advance Directive?: Yes Type of Advance Directive: Healthcare Power of [place]; Living will Copy of Healthcare Power of Attorney in Chart?: No - copy requested Copy of Living Will in Chart?: No - copy requested  Reviewed/Updated  Reviewed/Updated: Reviewed All (Medical, Surgical, Family, Medications, Allergies, Care Teams, Patient Goals)    Allergies (verified) Codeine   Current Medications (verified) Outpatient Encounter Medications as of 03/15/2024  Medication Sig   albuterol  (VENTOLIN  HFA) 108 (90 Base) MCG/ACT inhaler  INHALE 2 PUFFS BY MOUTH EVERY 6  HOURS AS NEEDED FOR WHEEZING OR SHORTNESS OF BREATH (Patient taking differently: 1-2 puffs every 4 (four) hours as needed for wheezing or shortness of breath.)   buPROPion  (WELLBUTRIN  XL) 150 MG 24 hr tablet Take 1 tablet (150 mg total) by mouth daily.   celecoxib  (CELEBREX ) 100 MG capsule Take 1 capsule (100 mg total) by mouth in the morning.   Continuous Glucose Receiver (FREESTYLE LIBRE 3 READER) DEVI Use it to check blood glucose as instructed.   Continuous Glucose Sensor (FREESTYLE LIBRE 3 SENSOR) MISC Use it to check blood glucose.  Apply new sensor every 14 days.   furosemide  (LASIX ) 20 MG tablet Take 1 tablet (20 mg total) by mouth every other day. Monday, Wednesday and Friday   glipiZIDE  (GLUCOTROL  XL) 5 MG 24 hr tablet TAKE 1 TABLET BY MOUTH ONCE DAILY IN THE EVENING   hydrochlorothiazide  (HYDRODIURIL ) 12.5 MG tablet Take 12.5 mg by mouth daily.   lisinopril  (ZESTRIL ) 20 MG tablet Take 1 tablet (20 mg total) by mouth in the morning.   metFORMIN  (GLUCOPHAGE -XR) 750 MG 24 hr tablet Take 750 mg by mouth every morning.   pravastatin  (PRAVACHOL ) 40 MG tablet Take 40 mg by mouth in the morning.   tirzepatide  (MOUNJARO ) 7.5 MG/0.5ML Pen Inject 7.5 mg into the skin once a week.   No facility-administered encounter medications on file as of 03/15/2024.    History: Past Medical History:  Diagnosis Date   Allergy    Arthritis    Chronic back pain    Depression    Diabetes mellitus without complication (HCC)    History of gout    History of kidney stones    Hypercholesteremia    Hypertension    PONV (postoperative nausea and vomiting)    Past Surgical History:  Procedure Laterality Date   ACHILLES TENDON SURGERY Left 12/29/2013   Procedure: PERCUTANEOUS TENDO-ACHILLES LENGTHENING;  Surgeon: Morene Donley Anon, DPM;  Location: [facility];  Service: Podiatry;  Laterality: Left;   CALCANEAL OSTEOTOMY WITH ILIAC CREST BONE GRAFT AND REPAIR SUBLEXING TENDON AND ACHILLES TENDON Left  12/29/2013   Procedure: CALCANEOCUBOID JOINT FUSION PERFORMED WITH BONE GRAFT;  Surgeon: Morene Donley Anon, DPM;  Location: [facility];  Service: Podiatry;  Laterality: Left;   CARPAL TUNNEL RELEASE Bilateral    COLONOSCOPY WITH PROPOFOL  N/A 05/02/2021   Procedure: COLONOSCOPY WITH PROPOFOL ;  Surgeon: Golda Claudis PENNER, MD;  Location: [facility];  Service: Endoscopy;  Laterality: N/A;  930   Disk fusion     lumbar   FLEXOR TENOTOMY  Right 02/18/2018   Procedure: PERCUTANEOUS FLEXOR TENOTOMY/APPLICATION OF BK POSTERIOR SPLINT RLE;  Surgeon: Anon Morene, DPM;  Location: [facility];  Service: Podiatry;  Laterality: Right;   FOOT ARTHRODESIS Left 12/29/2013   Procedure: TRIPLE ARTHRODESIS FOOT;  Surgeon: Morene Donley Anon, DPM;  Location: [facility];  Service: Podiatry;  Laterality: Left;   FOOT ARTHRODESIS Right 02/18/2018   Procedure: TRIPLE ARTHRODESIS RIGHT FOOT;  Surgeon: Anon Morene, DPM;  Location: [facility];  Service: Podiatry;  Laterality: Right;   JOINT REPLACEMENT     KNEE SURGERY Right    Surgery on this knee twice.    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   Procedure: TOTAL KNEE ARTHROPLASTY;  Surgeon: Margrette Taft BRAVO, MD;  Location: [facility];  Service: Orthopedics;  Laterality: Left;   TOTAL KNEE ARTHROPLASTY Right 01/15/2023   Procedure: TOTAL KNEE ARTHROPLASTY;  Surgeon: Margrette Taft BRAVO, MD;  Location: [facility];  Service: Orthopedics;  Laterality: Right;   Family History  Problem Relation Age of Onset   Heart disease Mother    Hyperlipidemia Mother    Hypertension Mother    Arthritis Father    Diabetes Father    Heart disease Father    Hyperlipidemia Father    Hypertension Father    Heart disease Sister    Hyperlipidemia Sister    Hypertension Sister    Social History   Occupational History    Not on file  Tobacco Use   Smoking status: Never   Smokeless tobacco: Former    Types: Chew    Quit date: 09/21/2013  Vaping Use   Vaping status: Never Used  Substance and Sexual Activity   Alcohol use: No   Drug use: No   Sexual activity: Yes    Birth control/protection: None   Tobacco Counseling Counseling given: Yes  SDOH Screenings   Food Insecurity: No Food Insecurity (03/15/2024)  Housing: Low Risk (03/15/2024)  Transportation Needs: No Transportation Needs (03/15/2024)  Utilities: Not At Risk (03/15/2024)  Depression (PHQ2-9): Medium Risk (03/15/2024)  Financial Resource Strain: Low Risk (08/19/2023)  Physical Activity: Inactive (03/15/2024)  Social Connections: Socially Integrated (03/15/2024)  Stress: No Stress Concern Present (03/15/2024)  Tobacco Use: Medium Risk (03/15/2024)  Health Literacy: Adequate Health Literacy (03/15/2024)   See flowsheets for full screening details  Depression Screen Depression Screening Exception Documentation Depression Screening Exception:: Patient refusal  PHQ 2 & 9 Depression Scale- Over the past 2 weeks, how often have you been bothered by any of the following problems? Little interest or pleasure in doing things: 0 Feeling down, depressed, or hopeless (PHQ Adolescent also includes...irritable): 0 PHQ-2 Total Score: 0 Trouble falling or staying asleep, or sleeping too much: 3 Feeling tired or having little energy: 3 Poor appetite or overeating (PHQ Adolescent also includes...weight loss): 0 Feeling bad about yourself - or that you are a failure or have let yourself or your family down: 0 Trouble concentrating on things, such as reading the newspaper or watching television (PHQ Adolescent also includes...like school work): 0 Moving or speaking so slowly that other people could have noticed. Or the opposite - being so fidgety or restless that you have been moving around a lot more than usual: 0 Thoughts that you would be better off dead, or of  hurting yourself in some way: 0 PHQ-9 Total Score: 6 If you checked off any problems, how difficult have these problems made it for you to do your work, take care of things at home, or get along with other people?: Somewhat difficult  Depression Treatment Depression Interventions/Treatment : [id number] Score <4 Follow-up Not Indicated     Goals Addressed               This Visit's Progress     I'd like to find out what's going on with my heart and lose a little bit more weight (pt-stated)               Objective:    Today's Vitals   03/15/24 0810  Weight: 297 lb (134.7 kg)  Height: 5' 10 (1.778 m)   Body mass index is 42.62 kg/m.  Hearing/Vision screen Hearing Screening - Comments:: Patient denies any hearing difficulties.   Vision Screening - Comments:: Wears rx glasses - up  to date with routine eye exams with  [facility] Immunizations and Health Maintenance Health Maintenance  Topic Date Due   FOOT EXAM  Never done   OPHTHALMOLOGY EXAM  Never done   Diabetic kidney evaluation - Urine ACR  Never done   Hepatitis C Screening  Never done   DTaP/Tdap/Td (1 - Tdap) Never done   Pneumococcal Vaccine: 50+ Years (1 of 2 - PCV) Never done   Zoster Vaccines- Shingrix (1 of 2) Never done   Medicare Annual Wellness (AWV)  10/30/2022   Influenza Vaccine  10/10/2023   COVID-19 Vaccine (4 - 2025-26 season) 11/10/2023   HEMOGLOBIN A1C  07/02/2024   Diabetic kidney evaluation - eGFR measurement  01/01/2025   Colonoscopy  05/02/2028   Meningococcal B Vaccine  Aged Out        Assessment/Plan:  This is a routine wellness examination for Ethan Oliver.  Patient Care Team: Bevely Doffing, FNP as PCP - General (Family Medicine) Alvan, Dorn FALCON, MD as Consulting Physician (Cardiology) Joshua Alm Hamilton, MD as Consulting Physician (Neurosurgery) Catherine Cools, MD as Consulting Physician (Pulmonary Disease) Margrette Taft BRAVO, MD as Consulting Physician (Orthopedic  Surgery)  I have personally reviewed and noted the following in the patients chart:   Medical and social history Use of alcohol, tobacco or illicit drugs  Current medications and supplements including opioid prescriptions. Functional ability and status Nutritional status Physical activity Advanced directives List of other physicians Hospitalizations, surgeries, and ER visits in previous 12 months Vitals Screenings to include cognitive, depression, and falls Referrals and appointments  Orders Placed This Encounter  Procedures   Microalbumin / creatinine urine ratio   Hepatitis C antibody   In addition, I have reviewed and discussed with patient certain preventive protocols, quality metrics, and best practice recommendations. A written personalized care plan for preventive services as well as general preventive health recommendations were provided to patient.   Mayzie Caughlin, CMA   03/15/2024   Return March 16, 2025 at 8:00 am.  After Visit Summary: (MyChart) Due to this being a telephonic visit, the after visit summary with patients personalized plan was offered to patient via MyChart   "

## 2024-03-17 ENCOUNTER — Other Ambulatory Visit: Payer: Self-pay

## 2024-03-17 DIAGNOSIS — E119 Type 2 diabetes mellitus without complications: Secondary | ICD-10-CM

## 2024-03-17 MED ORDER — TIRZEPATIDE 10 MG/0.5ML ~~LOC~~ SOAJ: 10.0000 mg | SUBCUTANEOUS | 1 refills | Status: AC

## 2024-03-22 ENCOUNTER — Ambulatory Visit

## 2024-03-29 NOTE — Progress Notes (Signed)
 Ethan Oliver                                          MRN: [id number]   03/29/2024   The [facility] Quality Team Specialist reviewed this patient medical record for the purposes of chart review for care gap closure. The following were reviewed: chart review for care gap closure-kidney health evaluation for diabetes:eGFR  and uACR.    [facility] Quality Team

## 2024-04-06 ENCOUNTER — Other Ambulatory Visit: Payer: Self-pay

## 2024-04-06 MED ORDER — METFORMIN HCL ER 750 MG PO TB24: 750.0000 mg | ORAL_TABLET | Freq: Every morning | ORAL | 0 refills | Status: AC

## 2024-04-06 NOTE — Telephone Encounter (Signed)
 Sent to pharmacy

## 2024-04-07 ENCOUNTER — Ambulatory Visit

## 2024-04-08 ENCOUNTER — Ambulatory Visit: Attending: Cardiology

## 2024-04-08 DIAGNOSIS — M79604 Pain in right leg: Secondary | ICD-10-CM | POA: Diagnosis not present

## 2024-04-08 DIAGNOSIS — M79606 Pain in leg, unspecified: Secondary | ICD-10-CM

## 2024-04-08 DIAGNOSIS — M79605 Pain in left leg: Secondary | ICD-10-CM | POA: Diagnosis not present

## 2024-04-08 LAB — VAS US ABI WITH/WO TBI
Left ABI: 1.4
Right ABI: 1.43

## 2024-04-09 ENCOUNTER — Ambulatory Visit: Payer: Self-pay | Admitting: Cardiology

## 2024-04-15 ENCOUNTER — Ambulatory Visit: Payer: Self-pay

## 2024-05-12 ENCOUNTER — Other Ambulatory Visit (HOSPITAL_COMMUNITY)

## 2024-06-07 ENCOUNTER — Ambulatory Visit: Admitting: Nurse Practitioner

## 2024-06-29 ENCOUNTER — Ambulatory Visit

## 2025-03-16 ENCOUNTER — Ambulatory Visit: Payer: Self-pay

## 2026-01-13 ENCOUNTER — Ambulatory Visit: Admitting: Orthopedic Surgery
